# Patient Record
Sex: Female | Born: 1950
Health system: Southern US, Community
[De-identification: ages and names within clinical notes are randomized; demographics above are authoritative.]

## PROBLEM LIST (undated history)

## (undated) DIAGNOSIS — E669 Obesity, unspecified: Secondary | ICD-10-CM

## (undated) DIAGNOSIS — E079 Disorder of thyroid, unspecified: Secondary | ICD-10-CM

## (undated) DIAGNOSIS — R002 Palpitations: Secondary | ICD-10-CM

## (undated) DIAGNOSIS — R011 Cardiac murmur, unspecified: Secondary | ICD-10-CM

## (undated) DIAGNOSIS — K219 Gastro-esophageal reflux disease without esophagitis: Secondary | ICD-10-CM

## (undated) DIAGNOSIS — R3129 Other microscopic hematuria: Secondary | ICD-10-CM

## (undated) DIAGNOSIS — F419 Anxiety disorder, unspecified: Secondary | ICD-10-CM

## (undated) DIAGNOSIS — I10 Essential (primary) hypertension: Secondary | ICD-10-CM

## (undated) DIAGNOSIS — M1990 Unspecified osteoarthritis, unspecified site: Secondary | ICD-10-CM

## (undated) DIAGNOSIS — T7840XA Allergy, unspecified, initial encounter: Secondary | ICD-10-CM

## (undated) DIAGNOSIS — E785 Hyperlipidemia, unspecified: Secondary | ICD-10-CM

## (undated) DIAGNOSIS — D0510 Intraductal carcinoma in situ of unspecified breast: Secondary | ICD-10-CM

## (undated) HISTORY — DX: Intraductal carcinoma in situ of unspecified breast: D05.10

## (undated) HISTORY — PX: OTHER SURGICAL HISTORY: SHX169

## (undated) HISTORY — DX: Other microscopic hematuria: R31.29

## (undated) HISTORY — DX: Essential (primary) hypertension: I10

## (undated) HISTORY — PX: HEEL SPUR SURGERY: SHX665

## (undated) HISTORY — DX: Obesity, unspecified: E66.9

## (undated) HISTORY — DX: Allergy, unspecified, initial encounter: T78.40XA

## (undated) HISTORY — PX: POLYPECTOMY: SHX149

## (undated) HISTORY — DX: Hyperlipidemia, unspecified: E78.5

## (undated) HISTORY — PX: COLONOSCOPY: SHX174

## (undated) HISTORY — PX: TUBAL LIGATION: SHX77

## (undated) HISTORY — PX: TONSILLECTOMY: SUR1361

## (undated) HISTORY — DX: Gastro-esophageal reflux disease without esophagitis: K21.9

## (undated) HISTORY — DX: Anxiety disorder, unspecified: F41.9

## (undated) HISTORY — DX: Palpitations: R00.2

---
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
## 1999-04-26 ENCOUNTER — Other Ambulatory Visit: Admission: RE | Admit: 1999-04-26 | Discharge: 1999-04-26 | Payer: Self-pay | Admitting: Obstetrics and Gynecology

## 2001-01-30 ENCOUNTER — Other Ambulatory Visit: Admission: RE | Admit: 2001-01-30 | Discharge: 2001-01-30 | Payer: Self-pay | Admitting: Obstetrics and Gynecology

## 2002-01-29 ENCOUNTER — Other Ambulatory Visit: Admission: RE | Admit: 2002-01-29 | Discharge: 2002-01-29 | Payer: Self-pay | Admitting: Obstetrics and Gynecology

## 2003-03-04 ENCOUNTER — Other Ambulatory Visit: Admission: RE | Admit: 2003-03-04 | Discharge: 2003-03-04 | Payer: Self-pay | Admitting: Obstetrics and Gynecology

## 2004-05-18 ENCOUNTER — Other Ambulatory Visit: Admission: RE | Admit: 2004-05-18 | Discharge: 2004-05-18 | Payer: Self-pay | Admitting: Obstetrics and Gynecology

## 2004-08-29 ENCOUNTER — Ambulatory Visit: Payer: Self-pay | Admitting: Family Medicine

## 2004-09-05 ENCOUNTER — Ambulatory Visit: Payer: Self-pay | Admitting: Family Medicine

## 2004-10-02 ENCOUNTER — Ambulatory Visit: Payer: Self-pay | Admitting: Internal Medicine

## 2004-10-10 ENCOUNTER — Ambulatory Visit: Payer: Self-pay | Admitting: Internal Medicine

## 2005-06-13 ENCOUNTER — Other Ambulatory Visit: Admission: RE | Admit: 2005-06-13 | Discharge: 2005-06-13 | Payer: Self-pay | Admitting: Obstetrics and Gynecology

## 2005-11-13 ENCOUNTER — Ambulatory Visit: Payer: Self-pay | Admitting: Family Medicine

## 2005-11-29 ENCOUNTER — Ambulatory Visit: Payer: Self-pay | Admitting: Family Medicine

## 2005-11-30 ENCOUNTER — Ambulatory Visit: Payer: Self-pay | Admitting: Internal Medicine

## 2005-11-30 LAB — HM DEXA SCAN: HM DEXA SCAN: NORMAL

## 2006-01-17 ENCOUNTER — Ambulatory Visit: Payer: Self-pay | Admitting: Family Medicine

## 2006-06-17 ENCOUNTER — Other Ambulatory Visit: Admission: RE | Admit: 2006-06-17 | Discharge: 2006-06-17 | Payer: Self-pay | Admitting: Obstetrics & Gynecology

## 2006-12-20 ENCOUNTER — Ambulatory Visit: Payer: Self-pay | Admitting: Family Medicine

## 2006-12-20 LAB — CONVERTED CEMR LAB
AST: 16 units/L (ref 0–37)
Albumin: 3.6 g/dL (ref 3.5–5.2)
Alkaline Phosphatase: 41 units/L (ref 39–117)
BUN: 7 mg/dL (ref 6–23)
Basophils Absolute: 0 10*3/uL (ref 0.0–0.1)
Chloride: 108 meq/L (ref 96–112)
Cholesterol: 168 mg/dL (ref 0–200)
Eosinophils Absolute: 0.3 10*3/uL (ref 0.0–0.6)
GFR calc non Af Amer: 93 mL/min
HCT: 39.3 % (ref 36.0–46.0)
HDL: 48.6 mg/dL (ref >39.0)
LDL Cholesterol: 107 mg/dL — ABNORMAL HIGH (ref 0–99)
MCHC: 33.1 g/dL (ref 30.0–36.0)
MCV: 85.9 fL (ref 78.0–100.0)
Monocytes Relative: 10.1 % (ref 3.0–11.0)
Neutrophils Relative %: 46.5 % (ref 43.0–77.0)
Potassium: 3.7 meq/L (ref 3.5–5.1)
RBC: 4.57 M/uL (ref 3.87–5.11)
Sodium: 145 meq/L (ref 135–145)
TSH: 1.86 microintl units/mL (ref 0.35–5.50)
Total CHOL/HDL Ratio: 3.5
Triglycerides: 61 mg/dL (ref 0–149)

## 2006-12-27 ENCOUNTER — Ambulatory Visit: Payer: Self-pay | Admitting: Family Medicine

## 2007-01-17 ENCOUNTER — Ambulatory Visit: Payer: Self-pay | Admitting: Family Medicine

## 2007-05-14 ENCOUNTER — Ambulatory Visit: Payer: Self-pay | Admitting: Cardiology

## 2007-05-14 ENCOUNTER — Ambulatory Visit: Payer: Self-pay | Admitting: Internal Medicine

## 2007-05-14 ENCOUNTER — Inpatient Hospital Stay (HOSPITAL_COMMUNITY): Admission: EM | Admit: 2007-05-14 | Discharge: 2007-05-16 | Payer: Self-pay | Admitting: Emergency Medicine

## 2007-05-16 ENCOUNTER — Encounter: Payer: Self-pay | Admitting: Family Medicine

## 2007-05-27 ENCOUNTER — Ambulatory Visit: Payer: Self-pay | Admitting: Family Medicine

## 2007-05-27 DIAGNOSIS — Z9189 Other specified personal risk factors, not elsewhere classified: Secondary | ICD-10-CM | POA: Insufficient documentation

## 2007-06-02 ENCOUNTER — Encounter: Admission: RE | Admit: 2007-06-02 | Discharge: 2007-06-02 | Payer: Self-pay | Admitting: Family Medicine

## 2007-06-02 DIAGNOSIS — Z86010 Personal history of colonic polyps: Secondary | ICD-10-CM | POA: Insufficient documentation

## 2007-06-02 DIAGNOSIS — M1990 Unspecified osteoarthritis, unspecified site: Secondary | ICD-10-CM | POA: Insufficient documentation

## 2007-06-05 ENCOUNTER — Ambulatory Visit: Payer: Self-pay | Admitting: Family Medicine

## 2007-06-16 ENCOUNTER — Ambulatory Visit (HOSPITAL_COMMUNITY): Admission: RE | Admit: 2007-06-16 | Discharge: 2007-06-16 | Payer: Self-pay | Admitting: Family Medicine

## 2007-06-19 ENCOUNTER — Ambulatory Visit: Payer: Self-pay | Admitting: Family Medicine

## 2007-09-24 ENCOUNTER — Other Ambulatory Visit: Admission: RE | Admit: 2007-09-24 | Discharge: 2007-09-24 | Payer: Self-pay | Admitting: Obstetrics and Gynecology

## 2007-09-30 ENCOUNTER — Ambulatory Visit: Payer: Self-pay | Admitting: Internal Medicine

## 2007-10-09 ENCOUNTER — Encounter: Payer: Self-pay | Admitting: Family Medicine

## 2007-10-09 ENCOUNTER — Ambulatory Visit: Payer: Self-pay | Admitting: Internal Medicine

## 2007-10-09 LAB — HM COLONOSCOPY

## 2007-12-25 ENCOUNTER — Ambulatory Visit: Payer: Self-pay | Admitting: Family Medicine

## 2007-12-25 LAB — CONVERTED CEMR LAB
ALT: 17 units/L (ref 0–35)
Albumin: 3.8 g/dL (ref 3.5–5.2)
Alkaline Phosphatase: 50 units/L (ref 39–117)
BUN: 11 mg/dL (ref 6–23)
Bilirubin, Direct: 0.1 mg/dL (ref 0.0–0.3)
CO2: 33 meq/L — ABNORMAL HIGH (ref 19–32)
Calcium: 9.4 mg/dL (ref 8.4–10.5)
Cholesterol: 208 mg/dL (ref 0–200)
Eosinophils Relative: 5.4 % — ABNORMAL HIGH (ref 0.0–5.0)
GFR calc Af Amer: 112 mL/min
Glucose, Bld: 95 mg/dL (ref 70–99)
Glucose, Urine, Semiquant: NEGATIVE
HDL: 46.1 mg/dL (ref >39.0)
Ketones, urine, test strip: NEGATIVE
Lymphocytes Relative: 35.8 % (ref 12.0–46.0)
MCHC: 32.3 g/dL (ref 30.0–36.0)
MCV: 85.5 fL (ref 78.0–100.0)
Monocytes Absolute: 0.5 10*3/uL (ref 0.1–1.0)
Monocytes Relative: 10.3 % (ref 3.0–12.0)
Neutro Abs: 2.1 10*3/uL (ref 1.4–7.7)
Neutrophils Relative %: 48.2 % (ref 43.0–77.0)
Platelets: 227 10*3/uL (ref 150–400)
RDW: 13.1 % (ref 11.5–14.6)
Sodium: 144 meq/L (ref 135–145)
Specific Gravity, Urine: 1.015
TSH: 2.12 microintl units/mL (ref 0.35–5.50)
Total Protein: 6.3 g/dL (ref 6.0–8.3)
Triglycerides: 85 mg/dL (ref 0–149)
pH: 6

## 2008-01-01 ENCOUNTER — Encounter: Payer: Self-pay | Admitting: Family Medicine

## 2008-01-02 ENCOUNTER — Ambulatory Visit: Payer: Self-pay | Admitting: Family Medicine

## 2008-01-02 DIAGNOSIS — R3129 Other microscopic hematuria: Secondary | ICD-10-CM | POA: Insufficient documentation

## 2008-01-02 DIAGNOSIS — E663 Overweight: Secondary | ICD-10-CM | POA: Insufficient documentation

## 2008-01-02 DIAGNOSIS — R609 Edema, unspecified: Secondary | ICD-10-CM | POA: Insufficient documentation

## 2008-01-02 LAB — CONVERTED CEMR LAB
Glucose, Urine, Semiquant: NEGATIVE
Nitrite: NEGATIVE
Protein, U semiquant: NEGATIVE
Specific Gravity, Urine: 1.025
WBC Urine, dipstick: NEGATIVE

## 2008-01-19 ENCOUNTER — Ambulatory Visit: Payer: Self-pay | Admitting: Family Medicine

## 2008-01-19 LAB — CONVERTED CEMR LAB
Blood in Urine, dipstick: NEGATIVE
Ketones, urine, test strip: NEGATIVE
Nitrite: NEGATIVE
Urobilinogen, UA: 0.2
WBC Urine, dipstick: NEGATIVE

## 2008-07-10 ENCOUNTER — Emergency Department (HOSPITAL_BASED_OUTPATIENT_CLINIC_OR_DEPARTMENT_OTHER): Admission: EM | Admit: 2008-07-10 | Discharge: 2008-07-10 | Payer: Self-pay | Admitting: Emergency Medicine

## 2008-10-01 ENCOUNTER — Other Ambulatory Visit: Admission: RE | Admit: 2008-10-01 | Discharge: 2008-10-01 | Payer: Self-pay | Admitting: Obstetrics and Gynecology

## 2009-01-24 ENCOUNTER — Encounter: Payer: Self-pay | Admitting: Family Medicine

## 2009-02-04 ENCOUNTER — Ambulatory Visit: Payer: Self-pay | Admitting: Family Medicine

## 2009-02-04 LAB — CONVERTED CEMR LAB
ALT: 20 units/L (ref 0–35)
AST: 20 units/L (ref 0–37)
BUN: 15 mg/dL (ref 6–23)
Basophils Relative: 0.3 % (ref 0.0–3.0)
Bilirubin, Direct: 0.1 mg/dL (ref 0.0–0.3)
Eosinophils Relative: 5.5 % — ABNORMAL HIGH (ref 0.0–5.0)
GFR calc non Af Amer: 91.93 mL/min (ref >60)
HCT: 38.7 % (ref 36.0–46.0)
HDL: 49.9 mg/dL (ref >39.00)
Lymphs Abs: 1.9 10*3/uL (ref 0.7–4.0)
Monocytes Relative: 9.8 % (ref 3.0–12.0)
Nitrite: NEGATIVE
Platelets: 216 10*3/uL (ref 150.0–400.0)
Potassium: 4.2 meq/L (ref 3.5–5.1)
Protein, U semiquant: NEGATIVE
RBC: 4.48 M/uL (ref 3.87–5.11)
Sodium: 144 meq/L (ref 135–145)
TSH: 2.24 microintl units/mL (ref 0.35–5.50)
Total Bilirubin: 0.6 mg/dL (ref 0.3–1.2)
Total CHOL/HDL Ratio: 4
Urobilinogen, UA: 0.2
VLDL: 17.2 mg/dL (ref 0.0–40.0)
WBC Urine, dipstick: NEGATIVE
WBC: 4.7 10*3/uL (ref 4.5–10.5)

## 2009-04-04 ENCOUNTER — Ambulatory Visit: Payer: Self-pay | Admitting: Family Medicine

## 2009-04-04 DIAGNOSIS — R319 Hematuria, unspecified: Secondary | ICD-10-CM | POA: Insufficient documentation

## 2010-01-27 ENCOUNTER — Encounter: Payer: Self-pay | Admitting: Family Medicine

## 2010-03-30 ENCOUNTER — Ambulatory Visit: Payer: Self-pay | Admitting: Family Medicine

## 2010-03-30 LAB — CONVERTED CEMR LAB
ALT: 13 units/L (ref 0–35)
AST: 17 units/L (ref 0–37)
Albumin: 3.9 g/dL (ref 3.5–5.2)
Basophils Relative: 0.5 % (ref 0.0–3.0)
Eosinophils Relative: 5.8 % — ABNORMAL HIGH (ref 0.0–5.0)
GFR calc non Af Amer: 97.99 mL/min (ref >60)
HCT: 39.9 % (ref 36.0–46.0)
HDL: 56.6 mg/dL (ref >39.00)
Hemoglobin: 13.3 g/dL (ref 12.0–15.0)
Lymphs Abs: 1.9 10*3/uL (ref 0.7–4.0)
Monocytes Relative: 10.6 % (ref 3.0–12.0)
Neutro Abs: 2.6 10*3/uL (ref 1.4–7.7)
Potassium: 4.3 meq/L (ref 3.5–5.1)
Sodium: 141 meq/L (ref 135–145)
TSH: 1.89 microintl units/mL (ref 0.35–5.50)
VLDL: 20.8 mg/dL (ref 0.0–40.0)
WBC: 5.5 10*3/uL (ref 4.5–10.5)

## 2010-04-06 ENCOUNTER — Ambulatory Visit: Payer: Self-pay | Admitting: Family Medicine

## 2010-04-06 DIAGNOSIS — M1710 Unilateral primary osteoarthritis, unspecified knee: Secondary | ICD-10-CM

## 2010-04-06 DIAGNOSIS — M179 Osteoarthritis of knee, unspecified: Secondary | ICD-10-CM | POA: Insufficient documentation

## 2010-04-06 LAB — CONVERTED CEMR LAB
Glucose, Urine, Semiquant: NEGATIVE
Nitrite: NEGATIVE
Protein, U semiquant: NEGATIVE
Specific Gravity, Urine: 1.015
pH: 7

## 2010-10-19 NOTE — Miscellaneous (Signed)
Summary: mammogram update   Clinical Lists Changes  Observations: Added new observation of MAMMOGRAM: normal (01/25/2010 11:07)      Preventive Care Screening  Mammogram:    Date:  01/25/2010    Results:  normal

## 2010-10-19 NOTE — Assessment & Plan Note (Signed)
Summary: cpx no pap//ccm   Vital Signs:  Patient profile:   60 year old female Menstrual status:  postmenopausal Height:      68.5 inches Weight:      294 pounds BMI:     44.21 Temp:     97.9 degrees F oral BP sitting:   120 / 80  (left arm) Cuff size:   regular  Vitals Entered By: Kathrynn Speed CMA (April 06, 2010 9:29 AM) CC: CPx, no pap, review labs, src     Menstrual Status postmenopausal   CC:  CPx, no pap, review labs, and src.  History of Present Illness: [name] is a 60 year old, married female, nonsmoker, who comes in today for general physical examination  She has a history of underlying venous insufficiency and fluid retention and takes Hydrocort thiazide 25 mg daily.  She takes OTC Prilosec daily for reflux and hyperacidity.  She has degenerative joint disease of her right knee.  At this juncture.  She can ambulate therefore do not recommend orthopedic evaluation.  She gets routine eye care.  Dental care.  Annual mammography Pap in the spring 2011 by nurse practitioner, does not do BSE on a regular basis, colonoscopy x 2 in GI because of polyps.  Last colonoscopy normal.  Mammogram 2011 as noted above.  Tetanus 2005 seasonal flu 2010  She also admits to a lot of stress in her life related to family issues.  Referred to [facility]  her migraines have gone away  Current Medications (verified): 1)  Zomig Zmt 2.5 Mg  Tbdp (Zolmitriptan) .... As Needed 2)  Hydrochlorothiazide 25 Mg  Tabs (Hydrochlorothiazide) .... Once Daily 3)  Prilosec Otc 20 Mg  Tbec (Omeprazole Magnesium) .... Once Daily 4)  Cvs Ibuprofen 200 Mg  Tabs (Ibuprofen) .... Three Once Daily  Allergies (verified): No Known Drug Allergies  Past History:  Past medical, surgical, family and social histories (including risk factors) reviewed, and no changes noted (except as noted below).  Past Medical History: Reviewed history from 01/02/2008 and no changes required. [facility] Colonic polyps, hx  of Osteoarthritis childbirth x 3 fluid retention  Past Surgical History: Reviewed history from 06/02/2007 and no changes required. CB x3 Tubal ligation Tonsillectomy Colonoscopy  Family History: Reviewed history from 06/02/2007 and no changes required. Family History of Colon CA 1st degree relative <60 Family History Hypertension Family History Kidney disease Family History of Cardiovascular disorder Family History of Respiratory disease  Social History: Reviewed history from 06/02/2007 and no changes required. Occupation: Married Never Smoked Alcohol use-no Regular exercise-yes  Review of Systems      See HPI  Physical Exam  General:  Well-developed,well-nourished,in no acute distress; alert,appropriate and cooperative throughout examination Head:  Normocephalic and atraumatic without obvious abnormalities. No apparent alopecia or balding. Eyes:  No corneal or conjunctival inflammation noted. EOMI. Perrla. Funduscopic exam benign, without hemorrhages, exudates or papilledema. Vision grossly normal. Ears:  External ear exam shows no significant lesions or deformities.  Otoscopic examination reveals clear canals, tympanic membranes are intact bilaterally without bulging, retraction, inflammation or discharge. Hearing is grossly normal bilaterally. Nose:  External nasal examination shows no deformity or inflammation. Nasal mucosa are pink and moist without lesions or exudates. Mouth:  Oral mucosa and oropharynx without lesions or exudates.  Teeth in good repair. Neck:  No deformities, masses, or tenderness noted. Chest Wall:  No deformities, masses, or tenderness noted. Breasts:  No mass, nodules, thickening, tenderness, bulging, retraction, inflamation, nipple discharge or skin changes noted.  Lungs:  Normal respiratory effort, chest expands symmetrically. Lungs are clear to auscultation, no crackles or wheezes. Heart:  Normal rate and regular rhythm. S1 and S2 normal  without gallop, murmur, click, rub or other extra sounds. Abdomen:  Bowel sounds positive,abdomen soft and non-tender without masses, organomegaly or hernias noted. Msk:  normal except for swelling of right knee pain and a lot of crepitance consistent with degenerative joint disease Pulses:  R and L carotid,radial,femoral,dorsalis pedis and posterior tibial pulses are full and equal bilaterally Extremities:  No clubbing, cyanosis, edema, or deformity noted with normal full range of motion of all joints.   Neurologic:  No cranial nerve deficits noted. Station and gait are normal. Plantar reflexes are down-going bilaterally. DTRs are symmetrical throughout. Sensory, motor and coordinative functions appear intact. Skin:  Intact without suspicious lesions or rashes Cervical Nodes:  No lymphadenopathy noted Axillary Nodes:  No palpable lymphadenopathy Inguinal Nodes:  No significant adenopathy Psych:  Cognition and judgment appear intact. Alert and cooperative with normal attention span and concentration. No apparent delusions, illusions, hallucinations   Impression & Recommendations:  Problem # 1:  PHYSICAL EXAMINATION (ICD-V70.0) Assessment Unchanged  Orders: Prescription Created Electronically [phone])  Problem # 2:  PERIPHERAL EDEMA (ICD-782.3) Assessment: Improved  Her updated medication list for this problem includes:    Hydrochlorothiazide 25 Mg Tabs (Hydrochlorothiazide) ..... Once daily  Orders: Prescription Created Electronically [phone])  Problem # 3:  MICROSCOPIC HEMATURIA (ICD-599.72) Assessment: Unchanged  Problem # 4:  OVERWEIGHT (ICD-278.02) Assessment: Unchanged  Orders: Prescription Created Electronically [phone])  Problem # 5:  DEGENERATIVE JOINT DISEASE, RIGHT KNEE (ICD-715.96) Assessment: Unchanged  Her updated medication list for this problem includes:    Cvs Ibuprofen 200 Mg Tabs (Ibuprofen) .Marland Kitchen... Three once daily  Orders: Prescription Created  Electronically [phone])  Complete Medication List: 1)  Hydrochlorothiazide 25 Mg Tabs (Hydrochlorothiazide) .... Once daily 2)  Prilosec Otc 20 Mg Tbec (Omeprazole magnesium) .... Once daily 3)  Cvs Ibuprofen 200 Mg Tabs (Ibuprofen) .... Three once daily  Other Orders: EKG w/ Interpretation (93000)  Patient Instructions: 1)  it would help preserve y knees  if u  were to get on a diet and walk 30 minutes daily 2)  Please schedule a follow-up appointment in 1 year. 3)  Schedule your mammogram. 4)  Schedule a colonoscopy/sigmoidoscopy to help detect colon cancer. 5)  Take calcium +Vitamin D daily. 6)  Take an Aspirin every day. Prescriptions: PRILOSEC OTC 20 MG  TBEC (OMEPRAZOLE MAGNESIUM) once daily  #100 x 3   Entered and Authorized by:   Roderick Pee MD   Signed by:   Roderick Pee MD on 04/06/2010   Method used:   Electronically to        [facility]  [address] [phone]* (retail)       [address]       [place], [place]  [address]       Ph: [phone]       Fax: [phone]   RxID:   [id number] HYDROCHLOROTHIAZIDE 25 MG  TABS (HYDROCHLOROTHIAZIDE) once daily  #100 Tablet x 3   Entered and Authorized by:   Roderick Pee MD   Signed by:   Roderick Pee MD on 04/06/2010   Method used:   [facility] to        [facility]  [address] [id number] ([facility]       [address]  [place], [place]  [address]       Ph: [phone]       Fax: [phone]   RxID:   [id number]     Laboratory Results   Urine Tests    Routine Urinalysis   Color: yellow Appearance: Clear Glucose: negative   (Normal Range: Negative) Bilirubin: negative   (Normal Range: Negative) Ketone: negative   (Normal Range: Negative) Spec. Gravity: 1.015   (Normal Range: 1.003-1.035) Blood: trace-intact   (Normal Range: Negative) pH: 7.0   (Normal Range: 5.0-8.0) Protein: negative   (Normal Range: Negative) Urobilinogen: 0.2   (Normal Range:  0-1) Nitrite: negative   (Normal Range: Negative) Leukocyte Esterace: negative   (Normal Range: Negative)    Comments: Kathrynn Speed CMA  April 06, 2010 10:35 AM

## 2011-01-30 NOTE — Consult Note (Signed)
[name], [name]                ACCOUNT NO.:  [id number]   MEDICAL RECORD NO.:  [id number]          PATIENT TYPE:  INP   LOCATION:  3729                         FACILITY:  [facility]   PHYSICIAN:  Pricilla Riffle, MD, FACCDATE OF BIRTH:  1951/02/03   DATE OF CONSULTATION:  05/15/2007  DATE OF DISCHARGE:                                 CONSULTATION   PRIMARY CARDIOLOGIST:  She is new to [facility] being seen by  Dr. Tenny Craw.   PRIMARY CARE [place]:  Dr. Tawanna Cooler.   PATIENT PROFILE:  A 60 year old, married, Caucasian female without prior  history of CAD who presented to the ER yesterday with greater than a 24-  hour history of chest pain.   PROBLEM LIST:  1. Chest pain.  2. Hypertension.  3. Obesity.  4. Status post bilateral tubal ligation.  5. Status post tonsillectomy.   ALLERGIES:  No known drug allergies.   HISTORY OF PRESENT ILLNESS:  A 60 year old, married, Caucasian female  without prior history of CAD.  She was in usual state of health until  4:00 a.m. on Tuesday, August 26 when she awoke with 05/10 mid sternal  chest discomfort radiating through to her back without associated  symptoms.  The symptoms lasting approximately 5 minutes and resolved  spontaneously.  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  Each episode lasted somewhere between 1 and 5 minutes.  She had 10+ episodes of chest discomfort on Tuesday and went to bed with  discomfort that night.  She slept well Tuesday into Wednesday and then  woke up with recurrent intermittent discomfort Wednesday morning  prompting her to present to a local Urgent Care.  While at Urgent Care  she said she had about 30 minutes worth of ongoing discomfort; however,  ECG shows showed no acute changes and she was advised to drive to the ED  for further  evaluation.  In the ER, her ECG showed no acute changes and  cardiac markers were negative.  She was admitted to the internal  medicine service and she has ruled out for MI by cardiac markers x3.  The D-dimer is negative.  She did have some recurrent discomfort last  night but has not had anything since 11:00 p.m. yesterday.  She is  currently pain-free.   HOME MEDICATIONS:  HCTZ 25 mg daily, ibuprofen p.r.n.   HOSPITAL MEDICATIONS:  1. Aspirin 325 mg daily.  2. Enoxaparin 40 mg subcu daily.  3. HCTZ 25 mg daily.   FAMILY HISTORY:  Mother is age 84 and is status post MI at age 78.  Father died at age 21 from complications of diabetes and end-stage renal  disease.  She has a brother who is alive at age 21 and suffered an MI  about 2 months ago.   SOCIAL HISTORY:  She lives in a [place] with her husband.  She has  three children.  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 She  has a 2-3 pack-year history of tobacco abuse while in her teens quitting  at age 68 or 79.  She denies any regular alcohol usage and denies any  drug usage.  She does not routinely exercise but experiences no  limitations in her normal activities at home and at work.   REVIEW OF SYSTEMS:  Positive for a headache occurring today as well as  mild nausea earlier this morning.  She had chest pain which radiated  through to her back as outlined in the HPI.  She reports that she does  have frequent urination.  Otherwise all systems reviewed are negative.   PHYSICAL EXAM:  Temperature 97.5, heart rate 47, respirations 18, blood  pressure 117/65, pulse ox 100% on 3 liters.  Pleasant white female in no acute distress, awake, alert and oriented  x3.  NECK:  No bruits or JVD.  LUNGS:  Respirations regular and unlabored.  CARDIAC:  Regular S1, S2, no S3, S4 or murmurs.  ABDOMEN:  Round, soft, nontender, nondistended.  Bowel sounds present  x4.  EXTREMITIES:  Warm, dry, pink.  No clubbing, cyanosis or  edema.  Dorsalis pedis and posterior tibial pulses 2+ and equal bilaterally.  HEENT:  Normal.  NEUROLOGIC:  Grossly intact and nonfocal.   Chest x-ray shows no acute cardiopulmonary disease with mild changes of  COPD.  EKG showed sinus bradycardia with a normal axis, a rate of 59  beats per minute and no acute ST or T changes.   LABORATORY DATA:  Hemoglobin 15.0, hematocrit 44.0, WBC 5.4, platelets  222, sodium 143, potassium 3.4, chloride 105, CO2 31, BUN 11, creatinine  0.74, glucose 99, D-dimer less than 0.22, INR 0.9, calcium 9.2, CK 77,  MB 2.2, troponin-I 0.02, total cholesterol 201, triglycerides 80, HDL 48  LDL 137.   ASSESSMENT/PLAN:  1. Chest pain.  The patient presents with typical and atypical      features. Notably she would experience pain during exertional      activities at work; however, would not have to stop her activities      or slow her pace and the pain would go away within a couple of      minutes.  Cardiac markers were negative and ECG shows no acute      changes. Plan:  Have her follow-up in our office for an outpatient      exercise Myoview on Tuesday, September 2 at 12:30 p.m. Would      continue the baby aspirin.  2. Hypertension, stable on HCTZ and would make no changes.  3. Hyperlipidemia.  Total cholesterol 201 with an LDL 137.  I would      recommend diet and exercise for the time being, however,      recommendations could change pending continued workup for chest      pain.      [name], ANP      Pricilla Riffle, MD, [facility]  Electronically Signed   CB/MEDQ  D:  05/15/2007  T:  05/16/2007  Job:  [id number]

## 2011-01-30 NOTE — H&P (Signed)
[name], [name]                ACCOUNT NO.:  [id number]   MEDICAL RECORD NO.:  [id number]          PATIENT TYPE:  EMS   LOCATION:  MAJO                         FACILITY:  [facility]   PHYSICIAN:  Willow Ora, MD           DATE OF BIRTH:  01-30-1951   DATE OF ADMISSION:  05/14/2007  DATE OF DISCHARGE:                              HISTORY & PHYSICAL   PRIMARY CARE DOCTOR:  Andrea Gens A. Tawanna Cooler, MD   CHIEF COMPLAINT:  Chest pain.   HISTORY OF PRESENT ILLNESS:  [name] is a 60 year old white female who  presented to the emergency room with a 36-hour history of on-and-off  chest pain.  The pain woke her up 2 days ago, is located at the left  lower anterior chest with some radiation to the back, and along with the  pain she felt some discomfort in the neck initially.  The pain is  described as pressure.  It has been as intense as 4/10.  It last from  few minutes to 30 minutes.  It does not change with food intake or deep  breaths.  It is not associated with nausea or diaphoresis.   PAST MEDICAL HISTORY:  1. Hypertension.  2. Bilateral tubal ligation.  3. Tonsillectomy.   SOCIAL HISTORY:  Does not smoke or drink.  She is married and has three  children.   FAMILY HISTORY:  She has a strong family history of heart disease  including her mother, who had a heart attack at age 36, and a brother  who had a heart attack at age 1.  Several other family members have  heart disease.   MEDICATIONS:  She takes HCTZ daily.  She also takes ibuprofen from time  to time, no excessive or recent ibuprofen intake.   REVIEW OF SYSTEMS:  Denies any fever, chills.  She occasionally has mild  heartburn and some cough.  No abdominal pain, shortness of breath, leg  swelling or dizziness.   ALLERGIES:  No known drug allergies.   PHYSICAL EXAM:  She is alert, oriented and not in apparent distress.  Blood pressure 154/87, pulse 58, respiratory rate 20, she is afebrile,  O2 saturation is 100% of 2 L.  LUNGS:   Clear to auscultation bilaterally.  CARDIOVASCULAR:  Regular rate and rhythm without a murmur.  CHEST:  Palpation does not reproduce the pain.  ABDOMEN:  Not distended, soft, good bowel sounds.  No organomegaly.  EXTREMITIES:  No edema.  NEUROLOGIC:  Again, she is alert, oriented, cooperative, in no apparent  distress.  She moves all extremities symmetrically.   LABORATORY AND X-RAYS:  EKG shows sinus bradycardia.  Chest x-ray showed  no acute disease.  Some evidence of COPD.  Cardiac enzymes at the  emergency room x2 were negative.  PT/PTT normal.  White count 5.4,  hemoglobin 13.4, platelets 222.  Potassium 5.3, creatinine 0.8, blood  sugar 88.  D-dimer is negative.   ASSESSMENT/PLAN:  1. The patient is admitted to the hospital with chest pain with some      typical features.  2. Her cardiovascular risk factors include hypertension and a strong      family history.  Cholesterol status unknown.  3. She will be admitted to telemetry and rule out for acute coronary      syndrome.  4. Will consider calling cardiology in the morning given her strong      family history.  5. Will prescribe a GI cocktail to see if there is any response to      that medication.  However, at this time she is basically pain-free.      The patient is advised to call her nurse tonight should the chest      pain return.  6. Lovenox for DVT prophylaxis.      Willow Ora, MD  Electronically Signed     JP/MEDQ  D:  05/14/2007  T:  05/15/2007  Job:  [id number]   cc:   Andrea Gens A. Tawanna Cooler, MD

## 2011-02-02 NOTE — Discharge Summary (Signed)
NAME:  [name], [name]          ACCOUNT NO.:  [id number]   MEDICAL RECORD NO.:  [id number]          PATIENT TYPE:   LOCATION:                                 FACILITY:   PHYSICIAN:  Valerie A. Felicity Coyer, MD     DATE OF BIRTH:   DATE OF ADMISSION:  DATE OF DISCHARGE:                               DISCHARGE SUMMARY   DISCHARGE DIAGNOSES:  1. Atypical chest pain.  2. Hypertension.  3. Mild hypokalemia.   HISTORY OF PRESENT ILLNESS:  Ms. [name] is a 60 year old female admitted  on May 14, 2007 with chief complaint of chest pain.  She does not  have a prior history of coronary artery disease and was awakened at 4:00  a.m. on Tuesday, Feb 10, 2008 with 5/10 midsternal chest pain, which  radiated through to her back.  She was admitted for further evaluation  and treatment.   PAST MEDICAL HISTORY:  1. Hypertension.  2. Obesity.  3. Bilateral tubal ligation.  4. Status post tonsillectomy.   COURSE OF HOSPITALIZATION:  Chest pain.  The patient was admitted.  Underwent serial cardiac enzymes, which were negative. Stress test was  performed after cardiology consultation with [facility] which  she noted no evidence of ischemia or infarct.  Estimated ejection  fraction was 62%.  She was discharged to home after results of stress  test available on May 16, 2007.   PERTINENT LABORATORY DATA:  At the time of discharge, BUN 11, creatinine  0.74, hemoglobin 1.3, and hematocrit 33.5.   MEDICATIONS:  At time of discharge, hydrochlorothiazide 25 mg p.o.  daily.   FOLLOWUP:  The patient was instructed to follow up with Dr. Kelle Darting  in 1-2 weeks and contact the office for an appointment.     Sandford Craze, NP      Raenette Rover. Felicity Coyer, MD  Electronically Signed   MO/MEDQ  D:  02/19/2008  T:  02/20/2008  Job:  [id number]

## 2011-04-19 ENCOUNTER — Encounter: Payer: Self-pay | Admitting: Family Medicine

## 2011-06-29 LAB — LIPID PANEL
LDL Cholesterol: 137 — ABNORMAL HIGH
Total CHOL/HDL Ratio: 4.2
Triglycerides: 80
VLDL: 16

## 2011-06-29 LAB — CARDIAC PANEL(CRET KIN+CKTOT+MB+TROPI)
CK, MB: 2.2
Relative Index: INVALID
Total CK: 80
Total CK: 91
Troponin I: 0.02

## 2011-06-29 LAB — DIFFERENTIAL
Basophils Absolute: 0
Eosinophils Relative: 3
Lymphocytes Relative: 29
Lymphs Abs: 1.6
Monocytes Absolute: 0.5
Monocytes Relative: 9

## 2011-06-29 LAB — CK TOTAL AND CKMB (NOT AT ARMC)
CK, MB: 2.3
Relative Index: INVALID
Total CK: 93

## 2011-06-29 LAB — I-STAT 8, (EC8 V) (CONVERTED LAB)
Acid-Base Excess: 5 — ABNORMAL HIGH
Bicarbonate: 30.5 — ABNORMAL HIGH
TCO2: 32
pCO2, Ven: 46.3
pH, Ven: 7.428 — ABNORMAL HIGH

## 2011-06-29 LAB — BASIC METABOLIC PANEL
CO2: 31
Calcium: 9.2
Creatinine, Ser: 0.74
GFR calc Af Amer: >60
GFR calc non Af Amer: >60
Sodium: 143

## 2011-06-29 LAB — POCT CARDIAC MARKERS
CKMB, poc: 1.6
Myoglobin, poc: 77
Myoglobin, poc: 86.7
Operator id: 234501
Troponin i, poc: <0.05

## 2011-06-29 LAB — CBC
HCT: 40.4
Hemoglobin: 13.4
RDW: 13.9

## 2011-06-29 LAB — POCT I-STAT CREATININE: Operator id: 234501

## 2011-06-29 LAB — D-DIMER, QUANTITATIVE: D-Dimer, Quant: <0.22

## 2011-07-23 ENCOUNTER — Encounter (HOSPITAL_BASED_OUTPATIENT_CLINIC_OR_DEPARTMENT_OTHER): Payer: Self-pay | Admitting: *Deleted

## 2011-07-23 ENCOUNTER — Emergency Department (INDEPENDENT_AMBULATORY_CARE_PROVIDER_SITE_OTHER): Payer: No Typology Code available for payment source

## 2011-07-23 ENCOUNTER — Emergency Department (HOSPITAL_BASED_OUTPATIENT_CLINIC_OR_DEPARTMENT_OTHER)
Admission: EM | Admit: 2011-07-23 | Discharge: 2011-07-23 | Disposition: A | Discharge status: Home / Self Care | Payer: No Typology Code available for payment source | Attending: Emergency Medicine | Admitting: Emergency Medicine

## 2011-07-23 DIAGNOSIS — M542 Cervicalgia: Secondary | ICD-10-CM | POA: Diagnosis present

## 2011-07-23 DIAGNOSIS — M5030 Other cervical disc degeneration, unspecified cervical region: Secondary | ICD-10-CM

## 2011-07-23 DIAGNOSIS — Y92410 Unspecified street and highway as the place of occurrence of the external cause: Secondary | ICD-10-CM | POA: Diagnosis not present

## 2011-07-23 DIAGNOSIS — Z79899 Other long term (current) drug therapy: Secondary | ICD-10-CM | POA: Diagnosis not present

## 2011-07-23 DIAGNOSIS — IMO0002 Reserved for concepts with insufficient information to code with codable children: Secondary | ICD-10-CM | POA: Diagnosis not present

## 2011-07-23 DIAGNOSIS — I10 Essential (primary) hypertension: Secondary | ICD-10-CM | POA: Diagnosis not present

## 2011-07-23 DIAGNOSIS — K219 Gastro-esophageal reflux disease without esophagitis: Secondary | ICD-10-CM | POA: Insufficient documentation

## 2011-07-23 NOTE — ED Provider Notes (Signed)
Medical screening examination/treatment/procedure(s) were performed by non-physician practitioner and as supervising physician I was immediately available for consultation/collaboration.   Waynesha Rammel, MD 07/23/11 2148 
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
## 2013-01-22 ENCOUNTER — Ambulatory Visit (INDEPENDENT_AMBULATORY_CARE_PROVIDER_SITE_OTHER): Payer: BC Managed Care – PPO | Admitting: Physician Assistant

## 2013-01-22 ENCOUNTER — Encounter: Payer: Self-pay | Admitting: Physician Assistant

## 2013-01-22 VITALS — BP 110/70 | HR 80 | Temp 97.3°F | Resp 16 | Ht 67.5 in | Wt 213.0 lb

## 2013-01-22 DIAGNOSIS — F419 Anxiety disorder, unspecified: Secondary | ICD-10-CM | POA: Insufficient documentation

## 2013-01-22 DIAGNOSIS — M1990 Unspecified osteoarthritis, unspecified site: Secondary | ICD-10-CM

## 2013-01-22 DIAGNOSIS — I10 Essential (primary) hypertension: Secondary | ICD-10-CM

## 2013-01-22 DIAGNOSIS — F411 Generalized anxiety disorder: Secondary | ICD-10-CM

## 2013-01-22 DIAGNOSIS — E785 Hyperlipidemia, unspecified: Secondary | ICD-10-CM | POA: Insufficient documentation

## 2013-01-22 DIAGNOSIS — E669 Obesity, unspecified: Secondary | ICD-10-CM

## 2013-01-22 LAB — COMPLETE METABOLIC PANEL WITH GFR
ALT: 14 U/L (ref 0–35)
AST: 13 U/L (ref 0–37)
Albumin: 4.1 g/dL (ref 3.5–5.2)
CO2: 30 mEq/L (ref 19–32)
Calcium: 9.4 mg/dL (ref 8.4–10.5)
Chloride: 104 mEq/L (ref 96–112)
GFR, Est African American: >89 mL/min
Potassium: 4.2 mEq/L (ref 3.5–5.3)
Total Protein: 6.2 g/dL (ref 6.0–8.3)

## 2013-01-22 LAB — LIPID PANEL
LDL Cholesterol: 155 mg/dL — ABNORMAL HIGH (ref 0–99)
VLDL: 17 mg/dL (ref 0–40)

## 2013-01-22 MED ORDER — MELOXICAM 7.5 MG PO TABS: 7.5000 mg | ORAL_TABLET | Freq: Every day | ORAL | Status: DC

## 2013-01-22 NOTE — Progress Notes (Signed)
Patient ID: [name] MRN: [id number], DOB: [birth year], 62 y.o. Date of Encounter: @DATE @  Chief Complaint:  Chief Complaint  Patient presents with  . Hyperlipidemia  . Hypertension    HPI: 62 y.o. year old female  presents for routine f/u.  1- HTN: taking HCTZ. No adv effect. No c/o  2-HLD: Was to make changes in diet, exercise and recheck. Is fasting so she can recheck now. Has made no diet/exercise changes and knows she will not make these changes. If still elevated, will need to take mediction for this.  3- obesity: see above  4- Anxiety: still has not needed to take any klonopin. Current dose of Lexapro seems to be working and anxiety is controlled with this.  5- knees: Has been having aching and stiffness in knees. Has an appt to see Dr. Despina Hick. Wants medication to use in place of OTC nsaid.   Past Medical History  Diagnosis Date  . Hypertension   . Microscopic hematuria   . GERD (gastroesophageal reflux disease)   . Hyperlipidemia   . Anxiety   . Obesity      Home Meds: See attached medication section for current medication list. Any medications entered into computer today will not appear on this note's list. The medications listed below were entered prior to today. Current Outpatient Prescriptions on File Prior to Visit  Medication Sig Dispense Refill  . aspirin 81 MG tablet Take 81 mg by mouth daily.        . cholecalciferol (VITAMIN D) 1000 UNITS tablet Take 1,000 Units by mouth daily.        . fish oil-omega-3 fatty acids 1000 MG capsule Take 2 g by mouth daily.        . hydrochlorothiazide 25 MG tablet Take 25 mg by mouth daily.        Marland Kitchen ibuprofen (ADVIL,MOTRIN) 200 MG tablet Take 600 mg by mouth every 6 (six) hours as needed. For pain       . Multiple Vitamin (MULTIVITAMIN) tablet Take 1 tablet by mouth daily.        Marland Kitchen omeprazole (PRILOSEC) 20 MG capsule Take 20 mg by mouth daily.         No current facility-administered medications on file prior to  visit.    Allergies: No Known Allergies  History   Social History  . Marital Status: Married    Spouse Name: N/A    Number of Children: N/A  . Years of Education: N/A   Occupational History  . Not on file.   Social History Main Topics  . Smoking status: Never Smoker   . Smokeless tobacco: Not on file  . Alcohol Use: No  . Drug Use: No  . Sexually Active: Not on file   Other Topics Concern  . Not on file   Social History Narrative  . No narrative on file    History reviewed. No pertinent family history.   Review of Systems:  See HPI for pertinent ROS. All other ROS negative.    Physical Exam: Blood pressure 110/70, pulse 80, temperature 97.3 F (36.3 C), temperature source Oral, resp. rate 16, height 5' 7.5" (1.715 m), weight 213 lb (96.616 kg)., Body mass index is 32.85 kg/(m^2). General: Obese WF. Appears in no acute distress. Neck: Supple. No thyromegaly. Full ROM. No lymphadenopathy.No carotid bruit.  Lungs: Clear bilaterally to auscultation without wheezes, rales, or rhonchi. Breathing is unlabored. Heart: RRR with S1 S2. No murmurs, rubs, or gallops. Abdomen: Soft, non-tender,  non-distended with normoactive bowel sounds. No hepatomegaly. No rebound/guarding. No obvious abdominal masses. Musculoskeletal:  Strength and tone normal for age. Extremities/Skin: Warm and dry. No clubbing or cyanosis. No edema. No rashes or suspicious lesions. Neuro: Alert and oriented X 3. Moves all extremities spontaneously. Gait is normal. CNII-XII grossly in tact. Psych:  Responds to questions appropriately with a normal affect.     ASSESSMENT AND PLAN:  62 y.o. year old female with  1. Anxiety Controlled. Cont Lexapro 10mg  QD  2. HTN (hypertension) At goal. Cont HCTZ 25mg  QD. Check lab  3. Hyperlipidemia She has not made diet/exercise changes and does not plan to. Will recheck lab and treat accordingly. Cardiac Risk Factors are; HTN, Obesity, Family Hx (mother with CAD @  55) [No CAD, PVD, DM, Smoke] - COMPLETE METABOLIC PANEL WITH GFR - Lipid panel  4. Obesity See # 3  5. OSTEOARTHRITIS F/U with Dr. Despina Hick. Will use mobic in interim.  - meloxicam (MOBIC) 7.5 MG tablet; Take 1 tablet (7.5 mg total) by mouth daily.  Dispense: 30 tablet; Refill: 0  6. Mammogram 03/21/2012-Neg 7. Colonoscopy 10/09/2007-Repeat 7 years 8. Pap-nml 04/2011.  9. Immunizations: Tetanus UTD per pt  Signed, [address] [place], [place], [facility] 01/22/2013 11:28 AM

## 2013-01-23 MED ORDER — SIMVASTATIN 10 MG PO TABS: 10.0000 mg | ORAL_TABLET | Freq: Every day | ORAL | Status: DC

## 2013-01-23 NOTE — Addendum Note (Signed)
Addended by: Elvina Mattes T on: 01/23/2013 10:21 AM   Modules accepted: Orders

## 2013-01-23 NOTE — Addendum Note (Signed)
Addended by: Elvina Mattes T on: 01/23/2013 09:09 AM   Modules accepted: Orders

## 2013-01-23 NOTE — Addendum Note (Signed)
Addended by: Elvina Mattes T on: 01/23/2013 09:11 AM   Modules accepted: Orders

## 2013-02-02 ENCOUNTER — Telehealth: Payer: Self-pay | Admitting: Family Medicine

## 2013-02-02 MED ORDER — ESCITALOPRAM OXALATE 10 MG PO TABS: 10.0000 mg | ORAL_TABLET | Freq: Every day | ORAL | Status: DC

## 2013-02-02 NOTE — Telephone Encounter (Signed)
Medication refilled per protocol. 

## 2013-03-09 ENCOUNTER — Other Ambulatory Visit (INDEPENDENT_AMBULATORY_CARE_PROVIDER_SITE_OTHER): Payer: BC Managed Care – PPO

## 2013-03-09 DIAGNOSIS — E785 Hyperlipidemia, unspecified: Secondary | ICD-10-CM

## 2013-03-09 LAB — HEPATIC FUNCTION PANEL
Bilirubin, Direct: 0.1 mg/dL (ref 0.0–0.3)
Indirect Bilirubin: 0.2 mg/dL (ref 0.0–0.9)
Total Bilirubin: 0.3 mg/dL (ref 0.3–1.2)
Total Protein: 6.3 g/dL (ref 6.0–8.3)

## 2013-03-09 LAB — LIPID PANEL
HDL: 58 mg/dL (ref >39)
LDL Cholesterol: 83 mg/dL (ref 0–99)
Total CHOL/HDL Ratio: 2.6 Ratio
Triglycerides: 50 mg/dL (ref <150)
VLDL: 10 mg/dL (ref 0–40)

## 2013-03-11 ENCOUNTER — Telehealth: Payer: Self-pay | Admitting: Family Medicine

## 2013-03-11 MED ORDER — SIMVASTATIN 10 MG PO TABS: 10.0000 mg | ORAL_TABLET | Freq: Every day | ORAL | Status: DC

## 2013-03-11 NOTE — Telephone Encounter (Signed)
patient made aware of lab results.  Refills to pharmacy.  Has appt for CPE in September

## 2013-03-11 NOTE — Telephone Encounter (Signed)
Message copied by Donne Anon on Wed Mar 11, 2013 10:02 AM ------      Message from: Allayne Butcher      Created: Tue Mar 10, 2013  1:59 PM       Good! Continue Simvastatin 10mg  one po QHS # 30 / 5 refills      LDL came down from 155 in May to 70 now. !!      Send in refills.       Recheck 6 months with lab and OV ------

## 2013-03-12 NOTE — Telephone Encounter (Signed)
Med was refilled on 02/02/13 with 5 refills

## 2013-03-15 ENCOUNTER — Other Ambulatory Visit: Payer: Self-pay | Admitting: Physician Assistant

## 2013-04-07 ENCOUNTER — Other Ambulatory Visit: Payer: Self-pay | Admitting: Orthopedic Surgery

## 2013-04-07 MED ORDER — DEXAMETHASONE SODIUM PHOSPHATE 10 MG/ML IJ SOLN: 10.0000 mg | Freq: Once | INTRAMUSCULAR | Status: DC

## 2013-04-07 NOTE — Progress Notes (Signed)
Preoperative surgical orders have been place into the Epic hospital system for [name] on 04/07/2013, 6:41 PM  by Patrica Duel for surgery on 04/22/2013.  Preop Knee Scope orders including IV Tylenol and IV Decadron as long as there are no contraindications to the above medications. Avel Peace, PA-C

## 2013-04-16 ENCOUNTER — Encounter (HOSPITAL_COMMUNITY): Payer: Self-pay | Admitting: Pharmacy Technician

## 2013-04-17 NOTE — Patient Instructions (Signed)
[name]  04/17/2013   Your procedure is scheduled on: 04/22/13               Surgery 1100am-1130am   Report to [facility] at    0830  AM.  Call this number if you have problems the morning of surgery: [phone]   Remember:   Do not eat food or drink liquids after midnight.   Take these medicines the morning of surgery with A SIP OF WATER:    Do not wear jewelry, make-up or nail polish.  Do not wear lotions, powders, or perfumes.   Do not shave 48 hours prior to surgery.  Do not bring valuables to the hospital.  Contacts, dentures or bridgework may not be worn into surgery.   .   Patients discharged the day of surgery will not be allowed to drive  home.  Name and phone number of your driver:    SEE CHG INSTRUCTION SHEET    Please read over the following fact sheets that you were given:  Incentive Spirometry fact sheet ,  coughing and deep breathing exercises, leg exercises               Failure to comply with these instructions may result in cancellation of your surgery.                Patient Signature ____________________________              Nurse Signature _____________________________

## 2013-04-20 ENCOUNTER — Encounter (HOSPITAL_COMMUNITY): Payer: Self-pay

## 2013-04-20 ENCOUNTER — Encounter (HOSPITAL_COMMUNITY)
Admission: RE | Admit: 2013-04-20 | Discharge: 2013-04-20 | Disposition: A | Discharge status: Home / Self Care | Payer: BC Managed Care – PPO | Source: Ambulatory Visit | Attending: Orthopedic Surgery | Admitting: Orthopedic Surgery

## 2013-04-20 ENCOUNTER — Ambulatory Visit (HOSPITAL_COMMUNITY)
Admission: RE | Admit: 2013-04-20 | Discharge: 2013-04-20 | Disposition: A | Discharge status: Home / Self Care | Payer: BC Managed Care – PPO | Source: Ambulatory Visit | Attending: Orthopedic Surgery | Admitting: Orthopedic Surgery

## 2013-04-20 DIAGNOSIS — Z01810 Encounter for preprocedural cardiovascular examination: Secondary | ICD-10-CM | POA: Insufficient documentation

## 2013-04-20 DIAGNOSIS — Z01818 Encounter for other preprocedural examination: Secondary | ICD-10-CM | POA: Insufficient documentation

## 2013-04-20 DIAGNOSIS — IMO0002 Reserved for concepts with insufficient information to code with codable children: Secondary | ICD-10-CM | POA: Insufficient documentation

## 2013-04-20 DIAGNOSIS — X58XXXA Exposure to other specified factors, initial encounter: Secondary | ICD-10-CM | POA: Insufficient documentation

## 2013-04-20 DIAGNOSIS — K219 Gastro-esophageal reflux disease without esophagitis: Secondary | ICD-10-CM | POA: Insufficient documentation

## 2013-04-20 DIAGNOSIS — I10 Essential (primary) hypertension: Secondary | ICD-10-CM | POA: Insufficient documentation

## 2013-04-20 DIAGNOSIS — Z01812 Encounter for preprocedural laboratory examination: Secondary | ICD-10-CM | POA: Insufficient documentation

## 2013-04-20 DIAGNOSIS — E785 Hyperlipidemia, unspecified: Secondary | ICD-10-CM | POA: Insufficient documentation

## 2013-04-20 HISTORY — DX: Cardiac murmur, unspecified: R01.1

## 2013-04-20 HISTORY — DX: Unspecified osteoarthritis, unspecified site: M19.90

## 2013-04-20 LAB — BASIC METABOLIC PANEL
Calcium: 9.8 mg/dL (ref 8.4–10.5)
GFR calc Af Amer: >90 mL/min (ref >90)
GFR calc non Af Amer: >90 mL/min (ref >90)
Glucose, Bld: 97 mg/dL (ref 70–99)
Potassium: 3.8 mEq/L (ref 3.5–5.1)
Sodium: 141 mEq/L (ref 135–145)

## 2013-04-20 LAB — CBC
MCH: 28 pg (ref 26.0–34.0)
MCHC: 32.3 g/dL (ref 30.0–36.0)
Platelets: 220 10*3/uL (ref 150–400)
RDW: 13.5 % (ref 11.5–15.5)

## 2013-04-21 NOTE — Progress Notes (Signed)
Called patient and left a message on home phone regarding time change of surgery to 1000am.  Instructed patient to call so aware she received message.

## 2013-04-21 NOTE — Progress Notes (Signed)
Patient returned call and stated had received message regarding time change of surgery and arrival time.

## 2013-04-22 ENCOUNTER — Encounter (HOSPITAL_COMMUNITY)
Admission: RE | Disposition: A | Discharge status: Home / Self Care | Payer: Self-pay | Source: Ambulatory Visit | Attending: Orthopedic Surgery

## 2013-04-22 ENCOUNTER — Encounter: Payer: Self-pay | Admitting: Family Medicine

## 2013-04-22 ENCOUNTER — Ambulatory Visit (HOSPITAL_COMMUNITY): Payer: BC Managed Care – PPO | Admitting: Anesthesiology

## 2013-04-22 ENCOUNTER — Encounter (HOSPITAL_COMMUNITY): Payer: Self-pay | Admitting: *Deleted

## 2013-04-22 ENCOUNTER — Encounter (HOSPITAL_COMMUNITY): Payer: Self-pay | Admitting: Anesthesiology

## 2013-04-22 ENCOUNTER — Ambulatory Visit (HOSPITAL_COMMUNITY)
Admission: RE | Admit: 2013-04-22 | Discharge: 2013-04-22 | Disposition: A | Discharge status: Home / Self Care | Payer: BC Managed Care – PPO | Source: Ambulatory Visit | Attending: Orthopedic Surgery | Admitting: Orthopedic Surgery

## 2013-04-22 DIAGNOSIS — IMO0002 Reserved for concepts with insufficient information to code with codable children: Secondary | ICD-10-CM | POA: Insufficient documentation

## 2013-04-22 DIAGNOSIS — K219 Gastro-esophageal reflux disease without esophagitis: Secondary | ICD-10-CM | POA: Insufficient documentation

## 2013-04-22 DIAGNOSIS — Z79899 Other long term (current) drug therapy: Secondary | ICD-10-CM | POA: Insufficient documentation

## 2013-04-22 DIAGNOSIS — I10 Essential (primary) hypertension: Secondary | ICD-10-CM | POA: Insufficient documentation

## 2013-04-22 DIAGNOSIS — M2240 Chondromalacia patellae, unspecified knee: Secondary | ICD-10-CM | POA: Insufficient documentation

## 2013-04-22 DIAGNOSIS — E785 Hyperlipidemia, unspecified: Secondary | ICD-10-CM | POA: Insufficient documentation

## 2013-04-22 DIAGNOSIS — S83242A Other tear of medial meniscus, current injury, left knee, initial encounter: Secondary | ICD-10-CM

## 2013-04-22 DIAGNOSIS — X58XXXA Exposure to other specified factors, initial encounter: Secondary | ICD-10-CM | POA: Insufficient documentation

## 2013-04-22 DIAGNOSIS — S83249A Other tear of medial meniscus, current injury, unspecified knee, initial encounter: Secondary | ICD-10-CM

## 2013-04-22 HISTORY — PX: KNEE ARTHROSCOPY: SHX127

## 2013-04-22 SURGERY — ARTHROSCOPY, KNEE
Anesthesia: General | Site: Knee | Laterality: Left | Wound class: Clean

## 2013-04-22 MED ORDER — ONDANSETRON HCL 4 MG/2ML IJ SOLN
INTRAMUSCULAR | Status: DC | PRN
  Administered 2013-04-22: 4 mg via INTRAVENOUS

## 2013-04-22 MED ORDER — LACTATED RINGERS IR SOLN
Status: DC | PRN
  Administered 2013-04-22: 9000 mL

## 2013-04-22 MED ORDER — MIDAZOLAM HCL 5 MG/5ML IJ SOLN
INTRAMUSCULAR | Status: DC | PRN
  Administered 2013-04-22: 2 mg via INTRAVENOUS

## 2013-04-22 MED ORDER — OXYCODONE HCL 5 MG PO TABS
5.0000 mg | ORAL_TABLET | ORAL | Status: DC | PRN
Start: 2013-04-22 — End: 2013-08-31

## 2013-04-22 MED ORDER — CHLORHEXIDINE GLUCONATE 4 % EX LIQD
60.0000 mL | Freq: Once | CUTANEOUS | Status: DC
  Filled 2013-04-22: qty 60

## 2013-04-22 MED ORDER — METHOCARBAMOL 500 MG PO TABS
500.0000 mg | ORAL_TABLET | Freq: Once | ORAL | Status: AC
  Administered 2013-04-22: 500 mg via ORAL
  Filled 2013-04-22: qty 1

## 2013-04-22 MED ORDER — FENTANYL CITRATE 0.05 MG/ML IJ SOLN
INTRAMUSCULAR | Status: DC | PRN
  Administered 2013-04-22 (×2): 50 ug via INTRAVENOUS

## 2013-04-22 MED ORDER — ACETAMINOPHEN 10 MG/ML IV SOLN
1000.0000 mg | Freq: Once | INTRAVENOUS | Status: AC
  Administered 2013-04-22: 1000 mg via INTRAVENOUS
  Filled 2013-04-22: qty 100

## 2013-04-22 MED ORDER — LACTATED RINGERS IV SOLN: INTRAVENOUS | Status: DC

## 2013-04-22 MED ORDER — CEFAZOLIN SODIUM-DEXTROSE 2-3 GM-% IV SOLR
2.0000 g | INTRAVENOUS | Status: AC
  Administered 2013-04-22: 2 g via INTRAVENOUS

## 2013-04-22 MED ORDER — SODIUM CHLORIDE 0.9 % IV SOLN: INTRAVENOUS | Status: DC

## 2013-04-22 MED ORDER — BUPIVACAINE-EPINEPHRINE 0.25% -1:200000 IJ SOLN
INTRAMUSCULAR | Status: AC
  Filled 2013-04-22: qty 1

## 2013-04-22 MED ORDER — OXYCODONE HCL 5 MG PO TABS
5.0000 mg | ORAL_TABLET | ORAL | Status: DC | PRN
  Administered 2013-04-22: 5 mg via ORAL
  Filled 2013-04-22: qty 1

## 2013-04-22 MED ORDER — LIDOCAINE HCL 1 % IJ SOLN
INTRAMUSCULAR | Status: DC | PRN
  Administered 2013-04-22: 60 mg via INTRADERMAL

## 2013-04-22 MED ORDER — METHOCARBAMOL 100 MG/ML IJ SOLN
500.0000 mg | Freq: Four times a day (QID) | INTRAMUSCULAR | Status: DC | PRN
  Filled 2013-04-22: qty 5

## 2013-04-22 MED ORDER — PROPOFOL 10 MG/ML IV BOLUS
INTRAVENOUS | Status: DC | PRN
  Administered 2013-04-22: 200 mg via INTRAVENOUS

## 2013-04-22 MED ORDER — BUPIVACAINE-EPINEPHRINE 0.25% -1:200000 IJ SOLN
INTRAMUSCULAR | Status: DC | PRN
  Administered 2013-04-22: 20 mL

## 2013-04-22 MED ORDER — HYDROMORPHONE HCL PF 1 MG/ML IJ SOLN
INTRAMUSCULAR | Status: DC | PRN
  Administered 2013-04-22: 1 mg via INTRAVENOUS

## 2013-04-22 MED ORDER — METHOCARBAMOL 500 MG PO TABS: 500.0000 mg | ORAL_TABLET | Freq: Four times a day (QID) | ORAL | Status: DC

## 2013-04-22 MED ORDER — LACTATED RINGERS IV SOLN
INTRAVENOUS | Status: DC | PRN
  Administered 2013-04-22: 09:00:00 via INTRAVENOUS

## 2013-04-22 MED ORDER — FENTANYL CITRATE 0.05 MG/ML IJ SOLN
25.0000 ug | INTRAMUSCULAR | Status: DC | PRN
  Administered 2013-04-22: 50 ug via INTRAVENOUS

## 2013-04-22 MED ORDER — CEFAZOLIN SODIUM-DEXTROSE 2-3 GM-% IV SOLR
INTRAVENOUS | Status: AC
  Filled 2013-04-22: qty 50

## 2013-04-22 MED ORDER — FENTANYL CITRATE 0.05 MG/ML IJ SOLN
INTRAMUSCULAR | Status: AC
  Filled 2013-04-22: qty 2

## 2013-04-22 SURGICAL SUPPLY — 24 items
BANDAGE ELASTIC 6 VELCRO ST LF (GAUZE/BANDAGES/DRESSINGS) ×1 IMPLANT
BLADE 4.2CUDA (BLADE) ×2 IMPLANT
CLOTH BEACON ORANGE TIMEOUT ST (SAFETY) ×2 IMPLANT
CUFF TOURN SGL QUICK 34 (TOURNIQUET CUFF) ×2
CUFF TRNQT CYL 34X4X40X1 (TOURNIQUET CUFF) ×1 IMPLANT
DRAPE U-SHAPE 47X51 STRL (DRAPES) ×2 IMPLANT
DRSG EMULSION OIL 3X3 NADH (GAUZE/BANDAGES/DRESSINGS) ×2 IMPLANT
DURAPREP 26ML APPLICATOR (WOUND CARE) ×2 IMPLANT
GLOVE BIO SURGEON STRL SZ8 (GLOVE) ×2 IMPLANT
GLOVE BIOGEL PI IND STRL 8 (GLOVE) ×1 IMPLANT
GLOVE BIOGEL PI INDICATOR 8 (GLOVE) ×1
GOWN STRL NON-REIN LRG LVL3 (GOWN DISPOSABLE) ×2 IMPLANT
MANIFOLD NEPTUNE II (INSTRUMENTS) ×4 IMPLANT
PACK ARTHROSCOPY WL (CUSTOM PROCEDURE TRAY) ×2 IMPLANT
PACK ICE MAXI GEL EZY WRAP (MISCELLANEOUS) ×6 IMPLANT
PADDING CAST COTTON 6X4 STRL (CAST SUPPLIES) ×3 IMPLANT
POSITIONER SURGICAL ARM (MISCELLANEOUS) ×2 IMPLANT
SET ARTHROSCOPY TUBING (MISCELLANEOUS) ×2
SET ARTHROSCOPY TUBING LN (MISCELLANEOUS) ×1 IMPLANT
SPONGE GAUZE 4X4 12PLY (GAUZE/BANDAGES/DRESSINGS) ×1 IMPLANT
SUT ETHILON 4 0 PS 2 18 (SUTURE) ×2 IMPLANT
TOWEL OR 17X26 10 PK STRL BLUE (TOWEL DISPOSABLE) ×2 IMPLANT
WAND 90 DEG TURBOVAC W/CORD (SURGICAL WAND) ×2 IMPLANT
WRAP KNEE MAXI GEL POST OP (GAUZE/BANDAGES/DRESSINGS) ×3 IMPLANT

## 2013-04-22 NOTE — Anesthesia Preprocedure Evaluation (Addendum)
Anesthesia Evaluation  Patient identified by MRN, date of birth, ID band Patient awake    Reviewed: Allergy & Precautions, H&P , NPO status , Patient's Chart, lab work & pertinent test results  Airway Mallampati: II TM Distance: >3 FB Neck ROM: full    Dental no notable dental hx. (+) Teeth Intact and Dental Advisory Given   Pulmonary neg pulmonary ROS,  breath sounds clear to auscultation  Pulmonary exam normal       Cardiovascular Exercise Tolerance: Good negative cardio ROS  Rhythm:regular Rate:Normal     Neuro/Psych negative neurological ROS  negative psych ROS   GI/Hepatic negative GI ROS, Neg liver ROS, GERD-  Medicated and Controlled,  Endo/Other  negative endocrine ROS  Renal/GU negative Renal ROS  negative genitourinary   Musculoskeletal   Abdominal   Peds  Hematology negative hematology ROS (+)   Anesthesia Other Findings   Reproductive/Obstetrics negative OB ROS                           Anesthesia Physical Anesthesia Plan  ASA: II  Anesthesia Plan: General   Post-op Pain Management:    Induction: Intravenous  Airway Management Planned: LMA  Additional Equipment:   Intra-op Plan:   Post-operative Plan:   Informed Consent: I have reviewed the patients History and Physical, chart, labs and discussed the procedure including the risks, benefits and alternatives for the proposed anesthesia with the patient or authorized representative who has indicated his/her understanding and acceptance.   Dental Advisory Given  Plan Discussed with: CRNA and Surgeon  Anesthesia Plan Comments:         Anesthesia Quick Evaluation  

## 2013-04-22 NOTE — Progress Notes (Signed)
Ice wrap sent home with patient with instructions on use

## 2013-04-22 NOTE — Interval H&P Note (Signed)
History and Physical Interval Note:  04/22/2013 10:01 AM  [name]  has presented today for surgery, with the diagnosis of LEFT KNEE MEDIAL MENISCUS TEAR  The various methods of treatment have been discussed with the patient and family. After consideration of risks, benefits and other options for treatment, the patient has consented to  Procedure(s): LEFT KNEE ARTHROSCOPY WITH DEBRIDEMENT (Left) as a surgical intervention .  The patient's history has been reviewed, patient examined, no change in status, stable for surgery.  I have reviewed the patient's chart and labs.  Questions were answered to the patient's satisfaction.     Loanne Drilling

## 2013-04-22 NOTE — Op Note (Signed)
Preoperative diagnosis-  Left knee medial meniscal tear  Postoperative diagnosis Left- knee medial meniscal tear   Plus Left medial femoral chondral defect  Procedure- Left knee arthroscopy with medial Meniscal debridement and chondroplasty   Surgeon- Gus Rankin. Rilynne Lonsway, MD  Anesthesia-General  EBL-  minimal Complications- None  Condition- PACU - hemodynamically stable.  Brief clinical note- -[name] is a 62 y.o.  female with a several month history of left knee pain and mechanical symptoms. Exam and history suggested medial meniscal tear confirmed by MRI. The patient presents now for arthroscopy and debridement   Procedure in detail -       After successful administration of General anesthetic, a tourmiquet is placed high on the Left  thigh and the Left lower extremity is prepped and draped in the usual sterile fashion. Time out is performed by the surgical team. Standard superomedial and inferolateral portal sites are marked and incisions made with an 11 blade. The inflow cannula is passed through the superomedial portal and camera through the inferolateral portal and inflow is initiated. Arthroscopic visualization proceeds.      The undersurface of the patella and trochlea are visualized and there is mild chondromalacia but no unstable chondral defects.. The medial and lateral gutters are visualized and there are  no loose bodies. Flexion and valgus force is applied to the knee and the medial compartment is entered. A spinal needle is passed into the joint through the site marked for the inferomedial portal. A small incision is made and the dilator passed into the joint. The findings for the medial compartment are medial meniscal tear posterior horn which is unstable and 1 x 2 cm unstable chondral defect medial femoral condyle . The tear is debrided to a stable base with baskets and a shaver and sealed off with the Arthrocare. The shaver is used to debride the unstable cartilage to a  stable cartilaginous base with stable edges. It is probed and found to be stable.    The intercondylar notch is visualized and the ACL appears normal. The lateral compartment is entered and the findings are normal .      The joint is again inspected and there are no other tears, defects or loose bodies identified. The arthroscopic equipment is then removed from the inferior portals which are closed with interrupted 4-0 nylon. 20 ml of .25% Marcaine with epinephrine are injected through the inflow cannula and the cannula is then removed and the portal closed with nylon. The incisions are cleaned and dried and a bulky sterile dressing is applied. The patient is then awakened and transported to recovery in stable condition.   04/22/2013, 10:59 AM

## 2013-04-22 NOTE — Transfer of Care (Signed)
Immediate Anesthesia Transfer of Care Note  Patient: [name]  Procedure(s) Performed: Procedure(s): LEFT KNEE ARTHROSCOPY WITH medial meniscusectomy and chondroplasty (Left)  Patient Location: PACU  Anesthesia Type:General  Level of Consciousness: awake, alert , oriented and patient cooperative  Airway & Oxygen Therapy: Patient Spontanous Breathing and Patient connected to face mask oxygen  Post-op Assessment: Report given to PACU RN and Post -op Vital signs reviewed and stable  Post vital signs: Reviewed and stable  Complications: No apparent anesthesia complications

## 2013-04-22 NOTE — Anesthesia Postprocedure Evaluation (Signed)
  Anesthesia Post-op Note  Patient: [name]  Procedure(s) Performed: Procedure(s) (LRB): LEFT KNEE ARTHROSCOPY WITH medial meniscusectomy and chondroplasty (Left)  Patient Location: PACU  Anesthesia Type: General  Level of Consciousness: awake and alert   Airway and Oxygen Therapy: Patient Spontanous Breathing  Post-op Pain: mild  Post-op Assessment: Post-op Vital signs reviewed, Patient's Cardiovascular Status Stable, Respiratory Function Stable, Patent Airway and No signs of Nausea or vomiting  Last Vitals:  Filed Vitals:   04/22/13 1130  BP: 130/73  Pulse: 46  Temp:   Resp: 19    Post-op Vital Signs: stable   Complications: No apparent anesthesia complications
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
## 2013-04-22 NOTE — Preoperative (Signed)
Beta Blockers   Reason not to administer Beta Blockers:Not Applicable 

## 2013-04-23 ENCOUNTER — Encounter (HOSPITAL_COMMUNITY): Payer: Self-pay | Admitting: Orthopedic Surgery

## 2013-05-26 ENCOUNTER — Other Ambulatory Visit: Payer: BC Managed Care – PPO

## 2013-06-01 ENCOUNTER — Other Ambulatory Visit: Payer: BC Managed Care – PPO | Admitting: Physician Assistant

## 2013-06-19 ENCOUNTER — Other Ambulatory Visit: Payer: Self-pay | Admitting: Physician Assistant

## 2013-06-22 ENCOUNTER — Other Ambulatory Visit: Payer: Self-pay | Admitting: Family Medicine

## 2013-06-22 NOTE — Telephone Encounter (Signed)
Simvastatin 10mg   #90

## 2013-06-22 NOTE — Telephone Encounter (Signed)
Medication refilled per protocol. 

## 2013-06-23 MED ORDER — SIMVASTATIN 10 MG PO TABS: 10.0000 mg | ORAL_TABLET | Freq: Every day | ORAL | Status: DC

## 2013-06-23 NOTE — Telephone Encounter (Signed)
Medication refilled per protocol. 

## 2013-06-24 ENCOUNTER — Other Ambulatory Visit: Payer: Self-pay | Admitting: Family Medicine

## 2013-06-24 MED ORDER — SIMVASTATIN 10 MG PO TABS: 10.0000 mg | ORAL_TABLET | Freq: Every day | ORAL | Status: DC

## 2013-06-24 NOTE — Telephone Encounter (Signed)
.  Rx Refilled - pt has CPE schedule in December

## 2013-08-26 ENCOUNTER — Other Ambulatory Visit: Payer: BC Managed Care – PPO

## 2013-08-26 DIAGNOSIS — Z0000 Encounter for general adult medical examination without abnormal findings: Secondary | ICD-10-CM

## 2013-08-26 LAB — CBC WITH DIFFERENTIAL/PLATELET
Basophils Absolute: 0 10*3/uL (ref 0.0–0.1)
Basophils Relative: 0 % (ref 0–1)
Eosinophils Absolute: 0.3 10*3/uL (ref 0.0–0.7)
HCT: 42.1 % (ref 36.0–46.0)
Hemoglobin: 13.8 g/dL (ref 12.0–15.0)
MCH: 27.9 pg (ref 26.0–34.0)
MCHC: 32.8 g/dL (ref 30.0–36.0)
Monocytes Absolute: 0.5 10*3/uL (ref 0.1–1.0)
Monocytes Relative: 10 % (ref 3–12)
Neutro Abs: 2.2 10*3/uL (ref 1.7–7.7)
Neutrophils Relative %: 46 % (ref 43–77)
RDW: 14.1 % (ref 11.5–15.5)

## 2013-08-26 LAB — COMPREHENSIVE METABOLIC PANEL
AST: 21 U/L (ref 0–37)
Alkaline Phosphatase: 49 U/L (ref 39–117)
BUN: 14 mg/dL (ref 6–23)
Glucose, Bld: 93 mg/dL (ref 70–99)
Potassium: 4 mEq/L (ref 3.5–5.3)
Total Bilirubin: 0.5 mg/dL (ref 0.3–1.2)

## 2013-08-26 LAB — LIPID PANEL
HDL: 57 mg/dL (ref >39)
LDL Cholesterol: 102 mg/dL — ABNORMAL HIGH (ref 0–99)
Total CHOL/HDL Ratio: 3.2 Ratio
Triglycerides: 111 mg/dL (ref <150)
VLDL: 22 mg/dL (ref 0–40)

## 2013-08-26 LAB — TSH: TSH: 4.286 u[IU]/mL (ref 0.350–4.500)

## 2013-08-31 ENCOUNTER — Ambulatory Visit (INDEPENDENT_AMBULATORY_CARE_PROVIDER_SITE_OTHER): Payer: BC Managed Care – PPO | Admitting: Physician Assistant

## 2013-08-31 ENCOUNTER — Encounter: Payer: Self-pay | Admitting: Physician Assistant

## 2013-08-31 VITALS — BP 122/82 | HR 68 | Temp 98.6°F | Resp 18 | Ht 69.0 in | Wt 208.0 lb

## 2013-08-31 DIAGNOSIS — I10 Essential (primary) hypertension: Secondary | ICD-10-CM | POA: Insufficient documentation

## 2013-08-31 DIAGNOSIS — E669 Obesity, unspecified: Secondary | ICD-10-CM

## 2013-08-31 DIAGNOSIS — Z86010 Personal history of colonic polyps: Secondary | ICD-10-CM

## 2013-08-31 DIAGNOSIS — F419 Anxiety disorder, unspecified: Secondary | ICD-10-CM

## 2013-08-31 DIAGNOSIS — E785 Hyperlipidemia, unspecified: Secondary | ICD-10-CM

## 2013-08-31 DIAGNOSIS — Z0000 Encounter for general adult medical examination without abnormal findings: Secondary | ICD-10-CM

## 2013-08-31 DIAGNOSIS — F411 Generalized anxiety disorder: Secondary | ICD-10-CM

## 2013-08-31 DIAGNOSIS — R3129 Other microscopic hematuria: Secondary | ICD-10-CM

## 2013-08-31 MED ORDER — HYDROCHLOROTHIAZIDE 25 MG PO TABS: 25.0000 mg | ORAL_TABLET | Freq: Every morning | ORAL | Status: DC

## 2013-08-31 MED ORDER — ESCITALOPRAM OXALATE 10 MG PO TABS: 10.0000 mg | ORAL_TABLET | Freq: Every day | ORAL | Status: DC

## 2013-08-31 MED ORDER — SIMVASTATIN 10 MG PO TABS: 10.0000 mg | ORAL_TABLET | Freq: Every day | ORAL | Status: DC

## 2013-08-31 NOTE — Progress Notes (Signed)
Patient ID: [name] MRN: [id number], DOB: [birth year], 62 y.o. Date of Encounter: 08/31/2013,   Chief Complaint: Physical (CPE)  HPI: 62 y.o. y/o white female  here for CPE.   Hypertension: She is still taking HCTZ daily. No adverse effects. No lightheadedness.  Hyperlipidemia: In the past we gave her a trial for diet and exercise but she did not make changes and reported that she knew she would not be compliant with making changes. She is now on simvastatin 10 mg. No myalgias or other adverse effects.  Anxiety: At her last office visit in May she was on Lexapro and said this was working well and her anxiety was well controlled with this.   However, today she says that several months ago, when she had knee surgery -- she forgot to take the Lexapro. She says that after that she just stayed off of it. However she feels that she does need to get back on the medication. She does often feel anxious and overwhelmed. She says in the past she was using the Klonopin at night to help with insomnia. However she says that she is no longer having any problems with sleeping and has not been taking the medication at all.   Review of Systems: Consitutional: No fever, chills, fatigue, night sweats, lymphadenopathy. No significant/unexplained weight changes. Eyes: No visual changes, eye redness, or discharge. ENT/Mouth: No ear pain, sore throat, nasal drainage, or sinus pain. Cardiovascular: No chest pressure,heaviness, tightness or squeezing, even with exertion. No increased shortness of breath or dyspnea on exertion.No palpitations, edema, orthopnea, PND. Respiratory: No cough, hemoptysis, SOB, or wheezing. Gastrointestinal: No anorexia, dysphagia, reflux, pain, nausea, vomiting, hematemesis, diarrhea, constipation, BRBPR, or melena. Breast: No mass, nodules, bulging, or retraction. No skin changes or inflammation. No nipple discharge. No lymphadenopathy. Genitourinary: No dysuria, hematuria,  incontinence, vaginal discharge, pruritis, burning, abnormal bleeding, or pain. Musculoskeletal: No decreased ROM, No joint pain or swelling. No significant pain in neck, back, or extremities. Skin: No rash, pruritis, or concerning lesions. Neurological: No headache, dizziness, syncope, seizures, tremors, memory loss, coordination problems, or paresthesias. Psychological: No anxiety, depression, hallucinations, SI/HI. Endocrine: No polydipsia, polyphagia, polyuria, or known diabetes.No increased fatigue. No palpitations/rapid heart rate. No significant/unexplained weight change. All other systems were reviewed and are otherwise negative.  Past Medical History  Diagnosis Date  . Microscopic hematuria   . GERD (gastroesophageal reflux disease)   . Hyperlipidemia   . Anxiety   . Obesity   . Heart murmur   . Arthritis     knees   . Hypertension      Past Surgical History  Procedure Laterality Date  . Tonsillectomy    . Tubal ligation    . Knee arthroscopy Left 04/22/2013    Procedure: LEFT KNEE ARTHROSCOPY WITH medial meniscusectomy and chondroplasty;  Surgeon: Loanne Drilling, MD;  Location: [facility];  Service: Orthopedics;  Laterality: Left;    Home Meds:  Current Outpatient Prescriptions on File Prior to Visit  Medication Sig Dispense Refill  . aspirin 81 MG tablet Take 81 mg by mouth at bedtime.       . calcium carbonate (OS-CAL) 600 MG TABS Take 600 mg by mouth daily.      . cholecalciferol (VITAMIN D) 1000 UNITS tablet Take 1,000 Units by mouth daily.       . fish oil-omega-3 fatty acids 1000 MG capsule Take 2 g by mouth daily.       Marland Kitchen ibuprofen (ADVIL,MOTRIN) 200 MG tablet Take  600 mg by mouth every 6 (six) hours as needed. For pain      . Multiple Vitamin (MULTIVITAMIN) tablet Take 1 tablet by mouth daily.        Marland Kitchen omeprazole (PRILOSEC) 20 MG capsule Take 20 mg by mouth daily.         No current facility-administered medications on file prior to visit.    Allergies: No  Known Allergies  History   Social History  . Marital Status: Married    Spouse Name: N/A    Number of Children: N/A  . Years of Education: N/A   Occupational History  . Not on file.   Social History Main Topics  . Smoking status: Never Smoker   . Smokeless tobacco: Never Used  . Alcohol Use: Yes     Comment: rare  . Drug Use: No  . Sexual Activity: Yes    Birth Control/ Protection: Surgical   Other Topics Concern  . Not on file   Social History Narrative   Works in [employer].   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       No other exercise.   Married.       Never smoked.    Family History  Problem Relation Age of Onset  . Heart disease Mother 52    MI at 47 and 4  . Cancer Father 71    Prostate Cancer    Physical Exam: Blood pressure 122/82, pulse 68, temperature 98.6 F (37 C), resp. rate 18, height 5\' 9"  (1.753 m), weight 208 lb (94.348 kg)., Body mass index is 30.7 kg/(m^2). General: Well developed, well nourished, in no acute distress. HEENT: Normocephalic, atraumatic. Conjunctiva pink, sclera non-icteric. Pupils 2 mm constricting to 1 mm, round, regular, and equally reactive to light and accomodation. EOMI. Internal auditory canal clear. TMs with good cone of light and without pathology. Nasal mucosa pink. Nares are without discharge. No sinus tenderness. Oral mucosa pink.  Pharynx without exudate.   Neck: Supple. Trachea midline. No thyromegaly. Full ROM. No lymphadenopathy.No Carotid Bruits. Lungs: Clear to auscultation bilaterally without wheezes, rales, or rhonchi. Breathing is of normal effort and unlabored. Cardiovascular: RRR with S1 S2. No murmurs, rubs, or gallops. Distal pulses 2+ symmetrically. No carotid or abdominal bruits. Breast: Symmetrical. No masses. Nipples without discharge. Abdomen: Soft, non-tender, non-distended with normoactive bowel sounds. No hepatosplenomegaly or masses. No rebound/guarding. No CVA tenderness. No hernias.    Genitourinary:  External genitalia without lesions. Vaginal mucosa pink.No discharge present. Cervix pink and without discharge. No cervical tenderness.Normal uterus size. No adnexal mass or tenderness.  Musculoskeletal: Full range of motion and 5/5 strength throughout. Without swelling, atrophy, tenderness, crepitus, or warmth. Extremities without clubbing, cyanosis, or edema. Calves supple. Skin: Warm and moist without erythema, ecchymosis, wounds, or rash. Neuro: A+Ox3. CN II-XII grossly intact. Moves all extremities spontaneously. Full sensation throughout. Normal gait. DTR 2+ throughout upper and lower extremities. Finger to nose intact. Psych:  Responds to questions appropriately with a normal affect.   Assessment/Plan:  62 y.o. y/o female here for CPE 1. Visit for preventive health examination  A. Screening Labs: Results for orders placed in visit on 08/26/13  CBC WITH DIFFERENTIAL      Result Value Range   WBC 4.9  4.0 - 10.5 K/uL   RBC 4.95  3.87 - 5.11 MIL/uL   Hemoglobin 13.8  12.0 - 15.0 g/dL   HCT 54.0  98.1 - 19.1 %   MCV 85.1  78.0 -  100.0 fL   MCH 27.9  26.0 - 34.0 pg   MCHC 32.8  30.0 - 36.0 g/dL   RDW 40.9  81.1 - 91.4 %   Platelets 220  150 - 400 K/uL   Neutrophils Relative % 46  43 - 77 %   Neutro Abs 2.2  1.7 - 7.7 K/uL   Lymphocytes Relative 38  12 - 46 %   Lymphs Abs 1.9  0.7 - 4.0 K/uL   Monocytes Relative 10  3 - 12 %   Monocytes Absolute 0.5  0.1 - 1.0 K/uL   Eosinophils Relative 6 (*) 0 - 5 %   Eosinophils Absolute 0.3  0.0 - 0.7 K/uL   Basophils Relative 0  0 - 1 %   Basophils Absolute 0.0  0.0 - 0.1 K/uL   Smear Review Criteria for review not met    COMPREHENSIVE METABOLIC PANEL      Result Value Range   Sodium 142  135 - 145 mEq/L   Potassium 4.0  3.5 - 5.3 mEq/L   Chloride 103  96 - 112 mEq/L   CO2 31  19 - 32 mEq/L   Glucose, Bld 93  70 - 99 mg/dL   BUN 14  6 - 23 mg/dL   Creat 7.82  9.56 - 2.13 mg/dL   Total Bilirubin 0.5  0.3 - 1.2 mg/dL    Alkaline Phosphatase 49  39 - 117 U/L   AST 21  0 - 37 U/L   ALT 23  0 - 35 U/L   Total Protein 6.3  6.0 - 8.3 g/dL   Albumin 4.3  3.5 - 5.2 g/dL   Calcium 9.8  8.4 - 08.6 mg/dL  LIPID PANEL      Result Value Range   Cholesterol 181  0 - 200 mg/dL   Triglycerides 578  <469 mg/dL   HDL 57  >62 mg/dL   Total CHOL/HDL Ratio 3.2     VLDL 22  0 - 40 mg/dL   LDL Cholesterol 952 (*) 0 - 99 mg/dL  TSH      Result Value Range   TSH 4.286  0.350 - 4.500 uIU/mL  VITAMIN D 25 HYDROXY      Result Value Range   Vit D, 25-Hydroxy 62  30 - 89 ng/mL    B. Pap: Last smear was performed 04/18/2011. Normal. Cytology was normal and HPV was also negative. Can wait to repeat this. Pelvic exam is normal today.  C. Screening Mammogram: Patient states that she did have followup mammogram for the year 2014.  D. DEXA/BMD: Will wait a couple more years to start obtaining these. She is on Os-Cal and vitamin D. Vitamin D level is within normal limits on current dose.  E. Colorectal Cancer Screening:  She had colonoscopy performed 10/09/2007. Positive diverticulosis. No polyps. Reports stated to repeat 7 years.  F. Immunizations:  Influenza: Patient states she did have the influenza vaccine for this year [facility]. Tetanus: Given here August 2012 Pneumococcal: We'll discuss at age 86 Zostavax: Patient is now age 56. I told her to call her insurance company to discuss coverage. I told her that if cost were not an issue I would recommend her getting this. She will call them regarding the cost and then if she wants to proceed with getting this she will call us for a prescription and then go to the pharmacy to get his immunization. Discussed all this with her today.    2. Hyperlipidemia  FLP at goal. LFTs normal. Continue current medication. - simvastatin (ZOCOR) 10 MG tablet; Take 1 tablet (10 mg total) by mouth at bedtime.  Dispense: 90 tablet; Refill: 1  3. Hypertension Blood pressure at goal. Bmet normal.  Continue current medication. - hydrochlorothiazide (HYDRODIURIL) 25 MG tablet; Take 1 tablet (25 mg total) by mouth every morning.  Dispense: 90 tablet; Refill: 1  4. Anxiety Restart Lexapro 10 mg. Can stay off of the Klonopin. - escitalopram (LEXAPRO) 10 MG tablet; Take 1 tablet (10 mg total) by mouth daily.  Dispense: 90 tablet; Refill: 1  5. Obesity She is aware of need for diet and exercise. However when we recommended this for her cholesterol she was noncompliant.  6. COLONIC POLYPS, HX OF  7.H/O Microscopic hematuria--history of evaluation by urology.  She will need followup in 6 months for fasting labs and office visit. Followup sooner if needed.  [address] [place], [place], [facility] 08/31/2013 12:01 PM

## 2013-09-21 ENCOUNTER — Telehealth: Payer: Self-pay | Admitting: Family Medicine

## 2013-09-21 NOTE — Telephone Encounter (Signed)
Pt is needing a prescription for the shingles shot she said that the insurance will cover it Call back number is [phone]

## 2013-09-22 MED ORDER — ZOSTER VACCINE LIVE 19400 UNT/0.65ML ~~LOC~~ SOLR: 0.6500 mL | Freq: Once | SUBCUTANEOUS | Status: DC

## 2013-09-22 NOTE — Telephone Encounter (Signed)
rx for shingles injection sent to pharm.

## 2014-02-13 ENCOUNTER — Other Ambulatory Visit: Payer: Self-pay | Admitting: Physician Assistant

## 2014-02-13 DIAGNOSIS — E785 Hyperlipidemia, unspecified: Secondary | ICD-10-CM

## 2014-02-15 NOTE — Telephone Encounter (Signed)
Medication refilled per protocol. 6 mth visit in 2 weeks

## 2014-03-03 ENCOUNTER — Encounter: Payer: Self-pay | Admitting: Physician Assistant

## 2014-03-03 ENCOUNTER — Encounter: Payer: Self-pay | Admitting: Family Medicine

## 2014-03-03 ENCOUNTER — Ambulatory Visit (INDEPENDENT_AMBULATORY_CARE_PROVIDER_SITE_OTHER): Payer: 59 | Admitting: Physician Assistant

## 2014-03-03 VITALS — BP 114/80 | HR 60 | Temp 98.4°F | Resp 18 | Wt 212.0 lb

## 2014-03-03 DIAGNOSIS — Z86010 Personal history of colonic polyps: Secondary | ICD-10-CM

## 2014-03-03 DIAGNOSIS — F411 Generalized anxiety disorder: Secondary | ICD-10-CM

## 2014-03-03 DIAGNOSIS — F419 Anxiety disorder, unspecified: Secondary | ICD-10-CM

## 2014-03-03 DIAGNOSIS — E669 Obesity, unspecified: Secondary | ICD-10-CM

## 2014-03-03 DIAGNOSIS — E785 Hyperlipidemia, unspecified: Secondary | ICD-10-CM

## 2014-03-03 DIAGNOSIS — I10 Essential (primary) hypertension: Secondary | ICD-10-CM

## 2014-03-03 MED ORDER — ESCITALOPRAM OXALATE 10 MG PO TABS: 10.0000 mg | ORAL_TABLET | Freq: Every day | ORAL | Status: DC

## 2014-03-03 NOTE — Progress Notes (Signed)
Patient ID: [name] MRN: [id number], DOB: [birth year], 63 y.o. Date of Encounter: 03/03/2014,   Chief Complaint: Routine F//U  HPI: 63 y.o. y/o white female  here for routine 6 month f/u OV.  She had a CPE as her LOV --08/31/2013.   Hypertension: She is still taking HCTZ daily. No adverse effects. No lightheadedness.  Hyperlipidemia: In the past we gave her a trial for diet and exercise but she did not make changes and reported that she knew she would not be compliant with making changes. She is now on simvastatin 10 mg. No myalgias or other adverse effects.  Anxiety: At her  office visit in May 2014 she was on Lexapro and said this was working well and her anxiety was well controlled with this.   However, at [place] 08/2013 she reported that several months prior, when she had knee surgery -- she forgot to take the Lexapro. She says that after that she just stayed off of it. However at  [place] 08/2013  she felt that she needed to get back on the medication.Off of medication,  she was often feeling anxious and overwhelmed. In the past she was using Klonopin at night to help with insomnia. However, she is no longer having any problems with sleeping and has not been taking the medication at all.  She had to quit her job in March. Says that she had surgery for torn meniscus. 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 Had significant knee pain. Discussed with her husband--"just couldn't do it any more"--quit working.   Review of Systems: All other pertinent ROS negative.   Past Medical History  Diagnosis Date  . Microscopic hematuria   . GERD (gastroesophageal reflux disease)   . Hyperlipidemia   . Anxiety   . Obesity   . Heart murmur   . Arthritis     knees   . Hypertension      Past Surgical History  Procedure Laterality Date  . Tonsillectomy    . Tubal ligation    . Knee arthroscopy  Left 04/22/2013    Procedure: LEFT KNEE ARTHROSCOPY WITH medial meniscusectomy and chondroplasty;  Surgeon: Gearlean Alf, MD;  Location: [facility];  Service: Orthopedics;  Laterality: Left;    Home Meds:  Current Outpatient Prescriptions on File Prior to Visit  Medication Sig Dispense Refill  . aspirin 81 MG tablet Take 81 mg by mouth at bedtime.       . calcium carbonate (OS-CAL) 600 MG TABS Take 600 mg by mouth daily.      . cholecalciferol (VITAMIN D) 1000 UNITS tablet Take 1,000 Units by mouth daily.       Marland Kitchen escitalopram (LEXAPRO) 10 MG tablet Take 1 tablet (10 mg total) by mouth daily.  90 tablet  1  . fish oil-omega-3 fatty acids 1000 MG capsule Take 2 g by mouth daily.       . hydrochlorothiazide (HYDRODIURIL) 25 MG tablet Take 1 tablet (25 mg total) by mouth every morning.  90 tablet  1  . ibuprofen (ADVIL,MOTRIN) 200 MG tablet Take 600 mg by mouth every 6 (six) hours as needed. For pain      . Multiple Vitamin (MULTIVITAMIN) tablet Take 1 tablet by mouth daily.        Marland Kitchen omeprazole (PRILOSEC) 20 MG capsule Take 20 mg by mouth daily.        Marland Kitchen  simvastatin (ZOCOR) 10 MG tablet TAKE 1 TABLET AT BEDTIME  30 tablet  0   No current facility-administered medications on file prior to visit.    Allergies: No Known Allergies  History   Social History  . Marital Status: Married    Spouse Name: N/A    Number of Children: N/A  . Years of Education: N/A   Occupational History  . Not on file.   Social History Main Topics  . Smoking status: Never Smoker   . Smokeless tobacco: Never Used  . Alcohol Use: Yes     Comment: rare  . Drug Use: No  . Sexual Activity: Yes    Birth Control/ Protection: Surgical   Other Topics Concern  . Not on file   Social History Narrative   Works in [employer].   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       No other exercise.   Married.       Never smoked.    Family History  Problem Relation Age of Onset  . Heart disease Mother 74    MI  at 62 and 11  . Cancer Father 12    Prostate Cancer    Physical Exam: Blood pressure 114/80, pulse 60, temperature 98.4 F (36.9 C), temperature source Oral, resp. rate 18, weight 212 lb (96.163 kg)., Body mass index is 31.29 kg/(m^2). General: Obese WF. Appears  in no acute distress. Neck: Supple. Trachea midline. No thyromegaly. Full ROM. No lymphadenopathy.No Carotid Bruits. Lungs: Clear to auscultation bilaterally without wheezes, rales, or rhonchi. Breathing is of normal effort and unlabored. Cardiovascular: RRR with S1 S2. No murmurs, rubs, or gallops. Distal pulses 2+ symmetrically. No carotid or abdominal bruits. Abdomen: Soft, non-tender, non-distended with normoactive bowel sounds. No hepatosplenomegaly or masses. No rebound/guarding. No CVA tenderness. No hernias.  Musculoskeletal: Strength and tone normal for her age.  Skin: Warm and moist without erythema, ecchymosis, wounds, or rash. Neuro: A+Ox3. CN II-XII grossly intact. Moves all extremities spontaneously. Full sensation throughout. Normal gait. Psych:  Responds to questions appropriately with a normal affect.   Assessment/Plan:  63 y.o. y/o female here for CPE   1. Hyperlipidemia Recheck FLP/LFT now. -On Simvastatin 10mg .   2. Hypertension Blood pressure at goal. Continue current medication.Check BMET to monitor.  - hydrochlorothiazide (HYDRODIURIL) 25 MG tablet; Take 1 tablet (25 mg total) by mouth every morning.  Dispense: 90 tablet; Refill: 1  3. Anxiety Controlled with  Lexapro 10 mg.   4. Obesity She is aware of need for diet and exercise. However when we recommended this for her cholesterol she was noncompliant.  5. COLONIC POLYPS, HX OF  6.H/O Microscopic hematuria--history of evaluation by urology.  7. Preventive Care--She had CPE 08/2013. The following was updated at that time: A. Screening Labs: She had a full panel of screening labs 08/26/2013 including  CBC, CMET, TSH, vitamin D. All of these  were normal.  Lipid panel was excellent with triglycerides 111, HDL 57, LDL 102.  B. Pap: Last smear was performed 04/18/2011. Normal. Cytology was normal and HPV was also negative. Can wait to repeat this. Pelvic exam is normal 08/2013.  C. Screening Mammogram: Patient states that she did have followup mammogram for the year 2014.  D. DEXA/BMD: Will wait a couple more years to start obtaining these. She is on Os-Cal and vitamin D. Vitamin D level is within normal limits on current dose.  E. Colorectal Cancer Screening:  She had colonoscopy performed 10/09/2007. Positive diverticulosis.  No polyps. Reports stated to repeat 7 years.  F. Immunizations:  Influenza: Patient states she did have the influenza vaccine [facility]. Tetanus: Given here August 2012 Pneumococcal: We'll discuss at age 57 Zostavax:  Insurance covered 100%--received this in January 2015.   She will need followup in 6 months for fasting labs and office visit. Followup sooner if needed.  Signed, [address] [place], [place], [facility] 03/03/2014 11:56 AM

## 2014-03-04 LAB — COMPLETE METABOLIC PANEL WITH GFR
ALBUMIN: 3.9 g/dL (ref 3.5–5.2)
ALT: 15 U/L (ref 0–35)
AST: 14 U/L (ref 0–37)
Alkaline Phosphatase: 47 U/L (ref 39–117)
BUN: 17 mg/dL (ref 6–23)
CALCIUM: 9.3 mg/dL (ref 8.4–10.5)
CHLORIDE: 104 meq/L (ref 96–112)
CO2: 27 mEq/L (ref 19–32)
Creat: 0.66 mg/dL (ref 0.50–1.10)
GFR, Est African American: >89 mL/min
GFR, Est Non African American: >89 mL/min
Glucose, Bld: 83 mg/dL (ref 70–99)
POTASSIUM: 3.9 meq/L (ref 3.5–5.3)
Sodium: 142 mEq/L (ref 135–145)
Total Bilirubin: 0.4 mg/dL (ref 0.2–1.2)
Total Protein: 6.5 g/dL (ref 6.0–8.3)

## 2014-03-04 LAB — LIPID PANEL
CHOL/HDL RATIO: 3.4 ratio
Cholesterol: 192 mg/dL (ref 0–200)
HDL: 56 mg/dL (ref >39)
LDL CALC: 116 mg/dL — AB (ref 0–99)
TRIGLYCERIDES: 100 mg/dL (ref <150)
VLDL: 20 mg/dL (ref 0–40)

## 2014-03-29 LAB — HM MAMMOGRAPHY: HM Mammogram: NORMAL

## 2014-04-09 ENCOUNTER — Encounter: Payer: Self-pay | Admitting: Internal Medicine

## 2014-04-21 ENCOUNTER — Encounter: Payer: Self-pay | Admitting: Internal Medicine

## 2014-05-25 ENCOUNTER — Telehealth: Payer: Self-pay | Admitting: Family Medicine

## 2014-05-25 DIAGNOSIS — I10 Essential (primary) hypertension: Secondary | ICD-10-CM

## 2014-05-25 NOTE — Telephone Encounter (Signed)
[phone]  Pt is needing a reifll on hydrochlorothiazide (HYDRODIURIL) 25 MG tablet [facility]

## 2014-05-26 MED ORDER — HYDROCHLOROTHIAZIDE 25 MG PO TABS: 25.0000 mg | ORAL_TABLET | Freq: Every morning | ORAL | Status: DC

## 2014-05-26 NOTE — Telephone Encounter (Signed)
Med sent to pharm 

## 2014-06-18 ENCOUNTER — Telehealth: Payer: Self-pay | Admitting: Family Medicine

## 2014-06-18 DIAGNOSIS — I10 Essential (primary) hypertension: Secondary | ICD-10-CM

## 2014-06-18 MED ORDER — HYDROCHLOROTHIAZIDE 25 MG PO TABS: 25.0000 mg | ORAL_TABLET | Freq: Every morning | ORAL | Status: DC

## 2014-06-18 NOTE — Telephone Encounter (Signed)
Medication refilled per protocol. 

## 2014-06-21 ENCOUNTER — Telehealth: Payer: Self-pay | Admitting: Family Medicine

## 2014-06-21 DIAGNOSIS — I10 Essential (primary) hypertension: Secondary | ICD-10-CM

## 2014-06-21 MED ORDER — HYDROCHLOROTHIAZIDE 25 MG PO TABS: 25.0000 mg | ORAL_TABLET | Freq: Every morning | ORAL | Status: DC

## 2014-06-21 NOTE — Telephone Encounter (Signed)
Medication refilled per protocol. 

## 2014-06-22 ENCOUNTER — Telehealth: Payer: Self-pay | Admitting: Family Medicine

## 2014-06-22 DIAGNOSIS — I10 Essential (primary) hypertension: Secondary | ICD-10-CM

## 2014-06-22 NOTE — Telephone Encounter (Signed)
error 

## 2014-06-25 ENCOUNTER — Encounter: Payer: Self-pay | Admitting: Family Medicine

## 2014-07-15 ENCOUNTER — Ambulatory Visit (INDEPENDENT_AMBULATORY_CARE_PROVIDER_SITE_OTHER): Payer: 59 | Admitting: *Deleted

## 2014-07-15 DIAGNOSIS — Z23 Encounter for immunization: Secondary | ICD-10-CM

## 2014-07-15 NOTE — Progress Notes (Signed)
Patient ID: [name], female   DOB: [birth year], 63 y.o.   MRN: [id number] Patient seen in office for Influenza Vaccination.   Tolerated IM administration well.

## 2014-07-20 ENCOUNTER — Telehealth: Payer: Self-pay | Admitting: Family Medicine

## 2014-07-20 DIAGNOSIS — E785 Hyperlipidemia, unspecified: Secondary | ICD-10-CM

## 2014-07-20 MED ORDER — SIMVASTATIN 10 MG PO TABS: 10.0000 mg | ORAL_TABLET | Freq: Every day | ORAL | Status: DC

## 2014-07-20 NOTE — Telephone Encounter (Signed)
Medication refilled per protocol. 

## 2014-08-11 ENCOUNTER — Other Ambulatory Visit: Payer: Self-pay | Admitting: Physician Assistant

## 2014-08-11 ENCOUNTER — Encounter: Payer: Self-pay | Admitting: Family Medicine

## 2014-08-11 NOTE — Telephone Encounter (Signed)
Medication refilled per protocol. 

## 2014-09-02 ENCOUNTER — Other Ambulatory Visit: Payer: 59

## 2014-09-02 DIAGNOSIS — Z79899 Other long term (current) drug therapy: Secondary | ICD-10-CM

## 2014-09-02 DIAGNOSIS — I10 Essential (primary) hypertension: Secondary | ICD-10-CM

## 2014-09-02 DIAGNOSIS — Z0000 Encounter for general adult medical examination without abnormal findings: Secondary | ICD-10-CM

## 2014-09-02 DIAGNOSIS — E785 Hyperlipidemia, unspecified: Secondary | ICD-10-CM

## 2014-09-02 DIAGNOSIS — E669 Obesity, unspecified: Secondary | ICD-10-CM

## 2014-09-03 LAB — LIPID PANEL
CHOL/HDL RATIO: 2.6 ratio
Cholesterol: 158 mg/dL (ref 0–200)
HDL: 60 mg/dL (ref >39)
LDL Cholesterol: 84 mg/dL (ref 0–99)
TRIGLYCERIDES: 69 mg/dL (ref <150)
VLDL: 14 mg/dL (ref 0–40)

## 2014-09-03 LAB — CBC WITH DIFFERENTIAL/PLATELET
BASOS ABS: 0.1 10*3/uL (ref 0.0–0.1)
BASOS PCT: 1 % (ref 0–1)
EOS ABS: 0.2 10*3/uL (ref 0.0–0.7)
EOS PCT: 4 % (ref 0–5)
HEMATOCRIT: 41.8 % (ref 36.0–46.0)
Hemoglobin: 13.8 g/dL (ref 12.0–15.0)
Lymphocytes Relative: 32 % (ref 12–46)
Lymphs Abs: 1.7 10*3/uL (ref 0.7–4.0)
MCH: 28.2 pg (ref 26.0–34.0)
MCHC: 33 g/dL (ref 30.0–36.0)
MCV: 85.5 fL (ref 78.0–100.0)
MONO ABS: 0.5 10*3/uL (ref 0.1–1.0)
MONOS PCT: 10 % (ref 3–12)
MPV: 9.7 fL (ref 9.4–12.4)
NEUTROS ABS: 2.8 10*3/uL (ref 1.7–7.7)
Neutrophils Relative %: 53 % (ref 43–77)
PLATELETS: 257 10*3/uL (ref 150–400)
RBC: 4.89 MIL/uL (ref 3.87–5.11)
RDW: 14.5 % (ref 11.5–15.5)
WBC: 5.3 10*3/uL (ref 4.0–10.5)

## 2014-09-03 LAB — COMPLETE METABOLIC PANEL WITH GFR
ALBUMIN: 3.9 g/dL (ref 3.5–5.2)
ALK PHOS: 44 U/L (ref 39–117)
ALT: 17 U/L (ref 0–35)
AST: 18 U/L (ref 0–37)
BILIRUBIN TOTAL: 0.4 mg/dL (ref 0.2–1.2)
BUN: 13 mg/dL (ref 6–23)
CO2: 26 mEq/L (ref 19–32)
Calcium: 9 mg/dL (ref 8.4–10.5)
Chloride: 104 mEq/L (ref 96–112)
Creat: 0.73 mg/dL (ref 0.50–1.10)
GFR, Est African American: >89 mL/min
GFR, Est Non African American: 89 mL/min
Glucose, Bld: 85 mg/dL (ref 70–99)
POTASSIUM: 3.9 meq/L (ref 3.5–5.3)
SODIUM: 142 meq/L (ref 135–145)
Total Protein: 6.1 g/dL (ref 6.0–8.3)

## 2014-09-03 LAB — TSH: TSH: 2.297 u[IU]/mL (ref 0.350–4.500)

## 2014-09-08 ENCOUNTER — Other Ambulatory Visit: Payer: Self-pay | Admitting: Family Medicine

## 2014-09-08 ENCOUNTER — Encounter: Payer: Self-pay | Admitting: Physician Assistant

## 2014-09-08 ENCOUNTER — Ambulatory Visit (INDEPENDENT_AMBULATORY_CARE_PROVIDER_SITE_OTHER): Payer: 59 | Admitting: Physician Assistant

## 2014-09-08 VITALS — BP 124/80 | HR 60 | Temp 98.0°F | Resp 18 | Ht 69.0 in | Wt 214.0 lb

## 2014-09-08 DIAGNOSIS — E785 Hyperlipidemia, unspecified: Secondary | ICD-10-CM

## 2014-09-08 DIAGNOSIS — F419 Anxiety disorder, unspecified: Secondary | ICD-10-CM

## 2014-09-08 DIAGNOSIS — Z86010 Personal history of colonic polyps: Secondary | ICD-10-CM

## 2014-09-08 DIAGNOSIS — I10 Essential (primary) hypertension: Secondary | ICD-10-CM

## 2014-09-08 DIAGNOSIS — E669 Obesity, unspecified: Secondary | ICD-10-CM

## 2014-09-08 DIAGNOSIS — Z0000 Encounter for general adult medical examination without abnormal findings: Secondary | ICD-10-CM

## 2014-09-08 MED ORDER — SIMVASTATIN 10 MG PO TABS: 10.0000 mg | ORAL_TABLET | Freq: Every day | ORAL | Status: DC

## 2014-09-08 MED ORDER — HYDROCHLOROTHIAZIDE 25 MG PO TABS: 25.0000 mg | ORAL_TABLET | Freq: Every day | ORAL | Status: DC

## 2014-09-08 NOTE — Progress Notes (Addendum)
Patient ID: [name] MRN: [id number], DOB: Jun 29, 1951, 63 y.o. Date of Encounter: 09/08/2014,   Chief Complaint: Complete physical exam  HPI: 63 y.o. y/o white female here for CPE.  She had a CPE  With me 08/31/2013.  Had ROV 02/2014.   Hypertension: She is still taking HCTZ daily. No adverse effects. No lightheadedness.  Hyperlipidemia: In the past we gave her a trial for diet and exercise but she did not make changes and reported that she knew she would not be compliant with making changes. She is now on simvastatin 10 mg. No myalgias or other adverse effects.  Anxiety: At her  office visit in May 2014 she was on Lexapro and said this was working well and her anxiety was well controlled with this.   However, at [place] 08/2013 she reported that several months prior, when she had knee surgery -- she forgot to take the Lexapro. She stated that after that she just stayed off of it. However at  [place] 08/2013  she felt that she needed to get back on the medication.Off of medication,  she was often feeling anxious and overwhelmed. In the past she was using Klonopin at night to help with insomnia. However, she is no longer having any problems with sleeping and has not been taking the medication at all. Lexapro was restarted at that [place] 08/2013.   Today she says that and she restarted the Lexapro she "didn't feel good "so she quit it. Says that she is feeling okay off of medication. Says that she does not want to try different SSRI right now. Thinks that since she is not working now and circumstances are different and she has learned how to follow her anxiety that she thinks that she can keep this controlled without medication.  Today--08/2014--- she also says that she was reading about possible side effects from taking Prilosec for a long time so she got concerned about that and stopped that medicine. Is that she does feel GERD symptoms now that she is off of the medication but she just is using  Pepcid when necessary.   She had to quit her job in March. Says that she had surgery for torn meniscus. 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 Had significant knee pain. Discussed with her husband--"just couldn't do it any more"--quit working.   No other complaints or concerns today.  Review of Systems: All other pertinent ROS negative.   Past Medical History  Diagnosis Date  . Microscopic hematuria   . GERD (gastroesophageal reflux disease)   . Hyperlipidemia   . Anxiety   . Obesity   . Heart murmur   . Arthritis     knees   . Hypertension      Past Surgical History  Procedure Laterality Date  . Tonsillectomy    . Tubal ligation    . Knee arthroscopy Left 04/22/2013    Procedure: LEFT KNEE ARTHROSCOPY WITH medial meniscusectomy and chondroplasty;  Surgeon: Gearlean Alf, MD;  Location: [facility];  Service: Orthopedics;  Laterality: Left;    Home Meds:  Current Outpatient Prescriptions on File Prior to Visit  Medication Sig Dispense Refill  . aspirin 81 MG tablet Take 81 mg by mouth at bedtime.     . calcium carbonate (OS-CAL) 600 MG TABS Take 600 mg by mouth daily.    . cholecalciferol (  VITAMIN D) 1000 UNITS tablet Take 1,000 Units by mouth daily.     . fish oil-omega-3 fatty acids 1000 MG capsule Take 2 g by mouth daily.     . hydrochlorothiazide (HYDRODIURIL) 25 MG tablet Take 1 tablet by mouth  every morning 90 tablet 0  . ibuprofen (ADVIL,MOTRIN) 200 MG tablet Take 600 mg by mouth every 6 (six) hours as needed. For pain    . Multiple Vitamin (MULTIVITAMIN) tablet Take 1 tablet by mouth daily.      . simvastatin (ZOCOR) 10 MG tablet Take 1 tablet (10 mg total) by mouth daily at 6 PM. 90 tablet 0  . escitalopram (LEXAPRO) 10 MG tablet Take 1 tablet (10 mg total) by mouth daily. (Patient not taking: Reported on 09/08/2014) 90 tablet 1  . omeprazole (PRILOSEC) 20 MG capsule Take  20 mg by mouth daily.       No current facility-administered medications on file prior to visit.    Allergies: No Known Allergies  History   Social History  . Marital Status: Married    Spouse Name: N/A    Number of Children: N/A  . Years of Education: N/A   Occupational History  . Not on file.   Social History Main Topics  . Smoking status: Never Smoker   . Smokeless tobacco: Never Used  . Alcohol Use: Yes     Comment: rare  . Drug Use: No  . Sexual Activity: Yes    Birth Control/ Protection: Surgical   Other Topics Concern  . Not on file   Social History Narrative   Worked in [employer].   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       No other exercise.   Married.       Never smoked.    Family History  Problem Relation Age of Onset  . Heart disease Mother 46    MI at 76 and 24  . Cancer Father 4    Prostate Cancer    Physical Exam: Blood pressure 124/80, pulse 60, temperature 98 F (36.7 C), temperature source Oral, resp. rate 18, height 5\' 9"  (1.753 m), weight 214 lb (97.07 kg)., Body mass index is 31.59 kg/(m^2). General: Obese WF. Appears  in no acute distress. Neck: Supple. Trachea midline. No thyromegaly. Full ROM. No lymphadenopathy.No Carotid Bruits. Lungs: Clear to auscultation bilaterally without wheezes, rales, or rhonchi. Breathing is of normal effort and unlabored. Cardiovascular: RRR with S1 S2. No murmurs, rubs, or gallops. Distal pulses 2+ symmetrically. No carotid or abdominal bruits. Breast exam: Symmetrical bilaterally. No skin changes. No nipple discharge. No masses with palpation. Abdomen: Soft, non-tender, non-distended with normoactive bowel sounds. No hepatosplenomegaly or masses. No rebound/guarding. No CVA tenderness. No hernias. Pelvic exam: External genitalia normal. Vaginal mucosa normal. Cervix normal. Manual exam normal with no adnexal mass or abnormality.    Musculoskeletal: Strength and tone normal for her age.  Skin: Warm and moist without erythema, ecchymosis, wounds, or rash. Neuro: A+Ox3. CN II-XII grossly intact. Moves all extremities spontaneously. Full sensation throughout. Normal gait. Psych:  Responds to questions appropriately with a normal affect.   Assessment/Plan:  63 y.o. y/o female here for CPE   Preventive Care-- CPE 08/2014. The following was updated at that time: A. Screening Labs: She had a full panel of screening labs 09/02/14 including  CBC, CMET, TSH. All of these were normal.  She had a vitamin D level done  08/2013 which was normal. Lipid panel was excellent with  LDL 84.  B. Pap: Last smear was performed 04/18/2011. Normal. Cytology was normal and HPV was also negative.   It has been 3 years so we'll go ahead and do repeat Pap smear now 08/2014  C. Screening Mammogram: Patient states that she did have followup mammogram for the year 2014. ADDENDUM ADDED 04/04/2015: Received Mammogram Report---Performed at [facility] 04/01/2015---Negative.   D. DEXA/BMD: Will wait a couple more years to start obtaining these. She is on Os-Cal and vitamin D. Vitamin D level is within normal limits on current dose.  E. Colorectal Cancer Screening:  She had colonoscopy performed 10/09/2007. Positive diverticulosis. No polyps. Reports stated to repeat 7 years. WILL BE DUE 2016---PT AWARE  F. Immunizations:  Influenza:   Given here 07/15/2014 Tetanus: Given here August 2012 Pneumococcal: Will discuss at age 65 Zostavax:  Insurance covered 100%--received this in January 2015.  1. Hyperlipidemia -On Simvastatin 10mg .   2. Hypertension Blood pressure at goal. Continue current medication. BMET normal - hydrochlorothiazide (HYDRODIURIL) 25 MG tablet; Take 1 tablet (25 mg total) by mouth every morning.  Dispense: 90 tablet; Refill: 1  3. Anxiety Stable off of Lexapro. Discussed trying another SSRI but she says that she feels her symptoms are  stable off of medication. She is to follow-up with [place] if she feels that she does need new medication.  4. Obesity She is aware of need for diet and exercise. However when we recommended this for her cholesterol she was noncompliant.  5. COLONIC POLYPS, HX OF---See Note above under preventive care section.  6.H/O Microscopic hematuria--history of evaluation by urology.      She will need followup in 6 months for fasting labs and office visit. Followup sooner if needed.  Marin Olp [place], [place], [facility] 09/08/2014 9:26 AM

## 2014-09-09 LAB — PAP, THIN PREP W/HPV RFLX HPV TYPE 16/18: HPV DNA HIGH RISK: NOT DETECTED

## 2014-09-13 ENCOUNTER — Encounter: Payer: Self-pay | Admitting: Family Medicine

## 2014-09-17 HISTORY — PX: COLONOSCOPY: SHX174

## 2014-10-19 ENCOUNTER — Encounter: Payer: Self-pay | Admitting: Internal Medicine

## 2014-10-22 ENCOUNTER — Encounter: Payer: Self-pay | Admitting: Internal Medicine

## 2014-10-26 ENCOUNTER — Ambulatory Visit (AMBULATORY_SURGERY_CENTER): Payer: Self-pay | Admitting: *Deleted

## 2014-10-26 VITALS — Ht 69.0 in | Wt 214.8 lb

## 2014-10-26 DIAGNOSIS — Z86010 Personal history of colonic polyps: Secondary | ICD-10-CM

## 2014-10-26 MED ORDER — MOVIPREP 100 G PO SOLR: ORAL | Status: DC

## 2014-10-26 NOTE — Progress Notes (Signed)
No egg or soy allergy  No anesthesia or intubation problems per pt  No diet medications taken  Registered in EMMI   

## 2014-11-09 ENCOUNTER — Ambulatory Visit (AMBULATORY_SURGERY_CENTER): Payer: 59 | Admitting: Internal Medicine

## 2014-11-09 ENCOUNTER — Encounter: Payer: Self-pay | Admitting: Internal Medicine

## 2014-11-09 VITALS — BP 133/91 | HR 61 | Temp 96.0°F | Resp 16 | Ht 69.0 in | Wt 214.0 lb

## 2014-11-09 DIAGNOSIS — D125 Benign neoplasm of sigmoid colon: Secondary | ICD-10-CM

## 2014-11-09 DIAGNOSIS — D120 Benign neoplasm of cecum: Secondary | ICD-10-CM

## 2014-11-09 DIAGNOSIS — Z86010 Personal history of colonic polyps: Secondary | ICD-10-CM

## 2014-11-09 MED ORDER — SODIUM CHLORIDE 0.9 % IV SOLN: 500.0000 mL | INTRAVENOUS | Status: DC

## 2014-11-09 NOTE — Progress Notes (Signed)
Called to room to assist during endoscopic procedure.  Patient ID and intended procedure confirmed with present staff. Received instructions for my participation in the procedure from the performing physician.  

## 2014-11-09 NOTE — Patient Instructions (Signed)
YOU HAD AN ENDOSCOPIC PROCEDURE TODAY AT THE [facility]: Refer to the procedure report that was given to you for any specific questions about what was found during the examination.  If the procedure report does not answer your questions, please call your gastroenterologist to clarify.  If you requested that your care partner not be given the details of your procedure findings, then the procedure report has been included in a sealed envelope for you to review at your convenience later.  YOU SHOULD EXPECT: Some feelings of bloating in the abdomen. Passage of more gas than usual.  Walking can help get rid of the air that was put into your GI tract during the procedure and reduce the bloating. If you had a lower endoscopy (such as a colonoscopy or flexible sigmoidoscopy) you may notice spotting of blood in your stool or on the toilet paper. If you underwent a bowel prep for your procedure, then you may not have a normal bowel movement for a few days.  DIET: Your first meal following the procedure should be a light meal and then it is ok to progress to your normal diet.  A half-sandwich or bowl of soup is an example of a good first meal.  Heavy or fried foods are harder to digest and may make you feel nauseous or bloated.  Likewise meals heavy in dairy and vegetables can cause extra gas to form and this can also increase the bloating.  Drink plenty of fluids but you should avoid alcoholic beverages for 24 hours.  ACTIVITY: Your care partner should take you home directly after the procedure.  You should plan to take it easy, moving slowly for the rest of the day.  You can resume normal activity the day after the procedure however you should NOT DRIVE or use heavy machinery for 24 hours (because of the sedation medicines used during the test).    SYMPTOMS TO REPORT IMMEDIATELY: A gastroenterologist can be reached at any hour.  During normal business hours, 8:30 AM to 5:00 PM Monday through Friday,  call [phone].  After hours and on weekends, please call the GI answering service at [phone] who will take a message and have the physician on call contact you.   Following lower endoscopy (colonoscopy or flexible sigmoidoscopy):  Excessive amounts of blood in the stool  Significant tenderness or worsening of abdominal pains  Swelling of the abdomen that is new, acute  Fever of 100F or higher  FOLLOW UP: If any biopsies were taken you will be contacted by phone or by letter within the next 1-3 weeks.  Call your gastroenterologist if you have not heard about the biopsies in 3 weeks.  Our staff will call the home number listed on your records the next business day following your procedure to check on you and address any questions or concerns that you may have at that time regarding the information given to you following your procedure. This is a courtesy call and so if there is no answer at the home number and we have not heard from you through the emergency physician on call, we will assume that you have returned to your regular daily activities without incident.  SIGNATURES/CONFIDENTIALITY: You and/or your care partner have signed paperwork which will be entered into your electronic medical record.  These signatures attest to the fact that that the information above on your After Visit Summary has been reviewed and is understood.  Full responsibility of the confidentiality of this   discharge information lies with you and/or your care-partner.  You might note some irritation in your nose or some drainage.  This may cause feelings of congestion.  This is from the oxygen, which can be irritating.  There is no need for concern, this should clear up in a day or so.  Continue your normal medications  Please read over handouts about polyps, diverticulosis, hemorrhoids and high fiber diets

## 2014-11-09 NOTE — Progress Notes (Signed)
Stable to RR 

## 2014-11-09 NOTE — Op Note (Signed)
[place]  [address] [place], [id number]   COLONOSCOPY PROCEDURE REPORT  PATIENT: Andrea, Pierce  MR#: [id number] BIRTHDATE: 1951-02-26 , 65  yrs. old GENDER: female ENDOSCOPIST: Lafayette Dragon, MD REFERRED TK:[id number] Dennard Schaumann, M.D. PROCEDURE DATE:  11/09/2014 PROCEDURE:   Colonoscopy with cold biopsy polypectomy First Screening Colonoscopy - Avg.  risk and is 50 yrs.  old or older - No.  Prior Negative Screening - Now for repeat screening. N/A  History of Adenoma - Now for follow-up colonoscopy & has been > or = to 3 yrs.  Yes hx of adenoma.  Has been 3 or more years since last colonoscopy.  Polyps Removed Today? Yes. ASA CLASS:   Class II INDICATIONS:adenomatous polyp removed in 2006.  No polyp on last colonoscopy in January 2009. MEDICATIONS: Monitored anesthesia care and Propofol 400 mg IV  DESCRIPTION OF PROCEDURE:   After the risks benefits and alternatives of the procedure were thoroughly explained, informed consent was obtained.  The digital rectal exam revealed no abnormalities of the rectum.   The LB PCF Q180 [id number]  endoscope was introduced through the anus and advanced to the cecum, which was identified by both the appendix and ileocecal valve. No adverse events experienced.   The quality of the prep was good, using MoviPrep  The instrument was then slowly withdrawn as the colon was fully examined.      COLON FINDINGS: Two firm sessile polyps measuring 3 mm in size were found at the cecum and in the sigmoid colon.  A polypectomy was performed with cold forceps.  The resection was complete, the polyp tissue was completely retrieved and sent to histology.   There was mild diverticulosis noted in the sigmoid colon.   Small internal Grade I hemorrhoids were found.  Retroflexed views revealed no abnormalities. The time to cecum=9 minutes 46 seconds.  Withdrawal time=6 minutes 57 seconds.  The scope was withdrawn and the procedure  completed. COMPLICATIONS: There were no immediate complications.  ENDOSCOPIC IMPRESSION: 1.   Two sessile polyps were found at the cecum and in the sigmoid colon; polypectomy was performed with cold forceps 2.   Mild diverticulosis was noted in the sigmoid colon 3.   Small internal Grade I hemorrhoids  RECOMMENDATIONS: 1.  Await biopsy results 2.  High fiber diet Recall colonoscopy pending path report  eSigned:  Lafayette Dragon, MD 11/09/2014 10:34 AM   cc:   PATIENT NAME:  [name], [name] MR#: [id number]

## 2014-11-10 ENCOUNTER — Telehealth: Payer: Self-pay

## 2014-11-10 NOTE — Telephone Encounter (Signed)
  Follow up Call-  Call back number 11/09/2014  Post procedure Call Back phone  # [phone]  Permission to leave phone message Yes     Patient questions:  Do you have a fever, pain , or abdominal swelling? No. Pain Score  0 *  Have you tolerated food without any problems? Yes.    Have you been able to return to your normal activities? Yes.    Do you have any questions about your discharge instructions: Diet   No. Medications  No. Follow up visit  No.  Do you have questions or concerns about your Care? No.  Actions: * If pain score is 4 or above: No action needed, pain <4.

## 2014-11-16 ENCOUNTER — Encounter: Payer: Self-pay | Admitting: Internal Medicine

## 2015-01-19 ENCOUNTER — Encounter: Payer: Self-pay | Admitting: Physician Assistant

## 2015-01-19 ENCOUNTER — Ambulatory Visit (INDEPENDENT_AMBULATORY_CARE_PROVIDER_SITE_OTHER): Payer: 59 | Admitting: Physician Assistant

## 2015-01-19 VITALS — BP 126/80 | HR 76 | Temp 98.3°F | Resp 18 | Wt 213.0 lb

## 2015-01-19 DIAGNOSIS — L237 Allergic contact dermatitis due to plants, except food: Secondary | ICD-10-CM | POA: Diagnosis not present

## 2015-01-19 MED ORDER — METHYLPREDNISOLONE ACETATE 80 MG/ML IJ SUSP
80.0000 mg | Freq: Once | INTRAMUSCULAR | Status: AC
  Administered 2015-01-19: 80 mg via INTRAMUSCULAR

## 2015-01-19 MED ORDER — PREDNISONE 20 MG PO TABS: ORAL_TABLET | ORAL | Status: DC

## 2015-01-19 NOTE — Progress Notes (Signed)
Patient ID: [name] MRN: [id number], DOB: Mar 13, 1951, 64 y.o. Date of Encounter: 01/19/2015, 3:58 PM    Chief Complaint:  Chief Complaint  Patient presents with  . poison ivy    hands,arms,ears, face,feet     HPI: 64 y.o. year old white female is that she was walking around her house on Friday which was 5 days ago. Says that she knew that she was in some poison ivy but just had to get the work done. She was doing pressure washing on the house and trimming some shrubs. Started developing rash the following day. Now with vesicles on her fingers and hands and even some vesicles on the wrist and forearms. Also with rash up both arms and a patch on her left cheek and areas on her feet and lower legs bilaterally.  Says that she has been bathing in solution that is supposed to help with this. Also applying calamine lotion. Also has used Apple cider vinegar. Also taking Benadryl. Even with all of these medications, they are helping very little and she is continuing to develop new areas of itchy rash.     Home Meds:   Outpatient Prescriptions Prior to Visit  Medication Sig Dispense Refill  . aspirin 81 MG tablet Take 81 mg by mouth at bedtime.     . calcium carbonate (OS-CAL) 600 MG TABS Take 600 mg by mouth daily.    . cholecalciferol (VITAMIN D) 1000 UNITS tablet Take 1,000 Units by mouth daily.     . famotidine (PEPCID AC) 10 MG chewable tablet Chew 10 mg by mouth as needed for heartburn.    . hydrochlorothiazide (HYDRODIURIL) 25 MG tablet Take 1 tablet (25 mg total) by mouth daily. 90 tablet 3  . ibuprofen (ADVIL,MOTRIN) 200 MG tablet Take 600 mg by mouth every 6 (six) hours as needed. For pain    . Multiple Vitamin (MULTIVITAMIN) tablet Take 1 tablet by mouth daily.      . simvastatin (ZOCOR) 10 MG tablet Take 1 tablet (10 mg total) by mouth daily at 6 PM. 90 tablet 3  . fish oil-omega-3 fatty acids 1000 MG capsule Take 2 g by mouth daily.      No facility-administered  medications prior to visit.    Allergies: No Known Allergies    Review of Systems: See HPI for pertinent ROS. All other ROS negative.    Physical Exam: Blood pressure 126/80, pulse 76, temperature 98.3 F (36.8 C), temperature source Oral, resp. rate 18, weight 213 lb (96.616 kg)., Body mass index is 31.44 kg/(m^2). General:  Obese WF. Appears in no acute distress. Neck: Supple. No thyromegaly. No lymphadenopathy. Lungs: Clear bilaterally to auscultation without wheezes, rales, or rhonchi. Breathing is unlabored. Heart: Regular rhythm. No murmurs, rubs, or gallops. Msk:  Strength and tone normal for age. Skin: Vesicles are between her fingers and on her hands and even some on her wrists and forearms. Pink papular rash on her upper arms bilaterally. Area of urticarial type rash on the left cheek. Pink vessiculopapular lesions on bilateral lower legs. Neuro: Alert and oriented X 3. Moves all extremities spontaneously. Gait is normal. CNII-XII grossly in tact. Psych:  Responds to questions appropriately with a normal affect.     ASSESSMENT AND PLAN:  64 y.o. year old female with  1. Allergic dermatitis due to poison ivy Will give Depo-Medrol 80 mg IM now. She will then start oral prednisone taper tomorrow and take as directed. Cautioned her of possible adverse effects  with prednisone. Follow-up if rash does not resolve with this treatment. Also can continue taking Benadryl oral as directed on the package. - predniSONE (DELTASONE) 20 MG tablet; Take 3 daily for 2 days, then 2 daily for 2 days, then 1 daily for 2 days.  Dispense: 12 tablet; Refill: 0   Signed, [id number] [place], [place], [facility] 01/19/2015 3:58 PM

## 2015-01-19 NOTE — Addendum Note (Signed)
Addended by: Olena Mater on: 01/19/2015 04:19 PM   Modules accepted: Orders

## 2015-04-01 LAB — HM MAMMOGRAPHY: HM MAMMO: NORMAL

## 2015-04-26 ENCOUNTER — Encounter: Payer: Self-pay | Admitting: Family Medicine

## 2015-07-21 ENCOUNTER — Ambulatory Visit (INDEPENDENT_AMBULATORY_CARE_PROVIDER_SITE_OTHER): Payer: 59 | Admitting: *Deleted

## 2015-07-21 DIAGNOSIS — Z23 Encounter for immunization: Secondary | ICD-10-CM

## 2015-08-26 ENCOUNTER — Other Ambulatory Visit: Payer: Self-pay | Admitting: Physician Assistant

## 2015-08-26 NOTE — Telephone Encounter (Signed)
Medication refilled per protocol. 

## 2015-09-13 ENCOUNTER — Other Ambulatory Visit: Payer: 59

## 2015-09-13 ENCOUNTER — Other Ambulatory Visit: Payer: Self-pay | Admitting: Family Medicine

## 2015-09-13 DIAGNOSIS — Z79899 Other long term (current) drug therapy: Secondary | ICD-10-CM

## 2015-09-13 DIAGNOSIS — F419 Anxiety disorder, unspecified: Secondary | ICD-10-CM

## 2015-09-13 DIAGNOSIS — E669 Obesity, unspecified: Secondary | ICD-10-CM

## 2015-09-13 DIAGNOSIS — E785 Hyperlipidemia, unspecified: Secondary | ICD-10-CM

## 2015-09-13 DIAGNOSIS — I10 Essential (primary) hypertension: Secondary | ICD-10-CM

## 2015-09-13 DIAGNOSIS — Z0000 Encounter for general adult medical examination without abnormal findings: Secondary | ICD-10-CM

## 2015-09-13 LAB — LIPID PANEL
Cholesterol: 158 mg/dL (ref 125–200)
HDL: 62 mg/dL (ref >=46)
LDL CALC: 80 mg/dL (ref <130)
Total CHOL/HDL Ratio: 2.5 Ratio (ref <=5.0)
Triglycerides: 81 mg/dL (ref <150)
VLDL: 16 mg/dL (ref <30)

## 2015-09-13 LAB — COMPLETE METABOLIC PANEL WITH GFR
ALK PHOS: 43 U/L (ref 33–130)
ALT: 18 U/L (ref 6–29)
AST: 21 U/L (ref 10–35)
Albumin: 3.7 g/dL (ref 3.6–5.1)
BUN: 14 mg/dL (ref 7–25)
CHLORIDE: 103 mmol/L (ref 98–110)
CO2: 27 mmol/L (ref 20–31)
CREATININE: 0.73 mg/dL (ref 0.50–0.99)
Calcium: 9.1 mg/dL (ref 8.6–10.4)
GFR, EST NON AFRICAN AMERICAN: 89 mL/min (ref >=60)
GLUCOSE: 88 mg/dL (ref 70–99)
POTASSIUM: 3.6 mmol/L (ref 3.5–5.3)
Sodium: 141 mmol/L (ref 135–146)
Total Bilirubin: 0.4 mg/dL (ref 0.2–1.2)
Total Protein: 6 g/dL — ABNORMAL LOW (ref 6.1–8.1)

## 2015-09-13 LAB — CBC WITH DIFFERENTIAL/PLATELET
Basophils Absolute: 0 10*3/uL (ref 0.0–0.1)
Basophils Relative: 0 % (ref 0–1)
EOS ABS: 0.2 10*3/uL (ref 0.0–0.7)
Eosinophils Relative: 4 % (ref 0–5)
HCT: 42 % (ref 36.0–46.0)
HEMOGLOBIN: 13.7 g/dL (ref 12.0–15.0)
LYMPHS ABS: 2.1 10*3/uL (ref 0.7–4.0)
Lymphocytes Relative: 36 % (ref 12–46)
MCH: 28.1 pg (ref 26.0–34.0)
MCHC: 32.6 g/dL (ref 30.0–36.0)
MCV: 86.2 fL (ref 78.0–100.0)
MONOS PCT: 12 % (ref 3–12)
MPV: 9.6 fL (ref 8.6–12.4)
Monocytes Absolute: 0.7 10*3/uL (ref 0.1–1.0)
NEUTROS ABS: 2.8 10*3/uL (ref 1.7–7.7)
NEUTROS PCT: 48 % (ref 43–77)
PLATELETS: 229 10*3/uL (ref 150–400)
RBC: 4.87 MIL/uL (ref 3.87–5.11)
RDW: 14.4 % (ref 11.5–15.5)
WBC: 5.9 10*3/uL (ref 4.0–10.5)

## 2015-09-13 LAB — TSH: TSH: 3.072 u[IU]/mL (ref 0.350–4.500)

## 2015-09-15 ENCOUNTER — Ambulatory Visit (INDEPENDENT_AMBULATORY_CARE_PROVIDER_SITE_OTHER): Payer: 59 | Admitting: Physician Assistant

## 2015-09-15 ENCOUNTER — Encounter: Payer: Self-pay | Admitting: Physician Assistant

## 2015-09-15 VITALS — BP 124/78 | HR 72 | Temp 98.4°F | Resp 18 | Ht 68.0 in | Wt 214.0 lb

## 2015-09-15 DIAGNOSIS — Z86010 Personal history of colonic polyps: Secondary | ICD-10-CM | POA: Diagnosis not present

## 2015-09-15 DIAGNOSIS — F419 Anxiety disorder, unspecified: Secondary | ICD-10-CM | POA: Diagnosis not present

## 2015-09-15 DIAGNOSIS — E669 Obesity, unspecified: Secondary | ICD-10-CM

## 2015-09-15 DIAGNOSIS — I10 Essential (primary) hypertension: Secondary | ICD-10-CM

## 2015-09-15 DIAGNOSIS — E785 Hyperlipidemia, unspecified: Secondary | ICD-10-CM | POA: Diagnosis not present

## 2015-09-15 DIAGNOSIS — Z0000 Encounter for general adult medical examination without abnormal findings: Secondary | ICD-10-CM

## 2015-09-15 NOTE — Progress Notes (Signed)
Patient ID: [name] MRN: [id number], DOB: Oct 04, 1950, 64 y.o. Date of Encounter: 09/15/2015,   Chief Complaint: Complete physical exam  HPI: 64 y.o. y/o white female here for CPE.    Hypertension: She is still taking HCTZ daily. No adverse effects. No lightheadedness.  Hyperlipidemia: In the past we gave her a trial for diet and exercise but she did not make changes and reported that she knew she would not be compliant with making changes. She is now on simvastatin 10 mg. No myalgias or other adverse effects.  Anxiety: At her  office visit in May 2014 she was on Lexapro and said this was working well and her anxiety was well controlled with this.   However, at [place] 08/2013 she reported that several months prior, when she had knee surgery -- she forgot to take the Lexapro. She stated that after that she just stayed off of it. However at  [place] 08/2013  she felt that she needed to get back on the medication.Off of medication,  she was often feeling anxious and overwhelmed. In the past she was using Klonopin at night to help with insomnia. However, she is no longer having any problems with sleeping and has not been taking the medication at all. Lexapro was restarted at that [place] 08/2013.  08/2014-- she says that and she restarted the Lexapro she "didn't feel good "so she quit it. Says that she is feeling okay off of medication. Says that she does not want to try different SSRI right now. Thinks that since she is not working now and circumstances are different and she has learned how to follow her anxiety that she thinks that she can keep this controlled without medication. 08/2015--  she says that she has continued to do fine off of Lexapro. Says that if she starts to feel anxiety, she "prays to God and her anxiety resolves."  08/2014--- she also says that she was reading about possible side effects from taking Prilosec for a long time so she got concerned about that and stopped that  medicine. Is that she does feel GERD symptoms now that she is off of the medication but she just is using Pepcid when necessary. 08/2015--he says that she went without taking Prilosec OTC for 10 months. However says that she had a lot of symptoms so she has gone back to using it but is only using it PRN. Says rather than taking it every single day she just takes it if she starts feeling symptoms and this controls the symptoms very well.   She had to quit her job in March 2015. Says that she had surgery for torn meniscus. 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 Had significant knee pain. Discussed with her husband--"just couldn't do it any more"--quit working.   08/2015: Says that she got a membership to [employer] in August 2016.  She was going 3 days a week--- except she has not gotten there for the last 5 weeks Was riding the bike and doing some weights.  No other complaints or concerns today.  Review of Systems: All other pertinent ROS negative.   Past Medical History  Diagnosis Date  . Microscopic hematuria   . GERD (gastroesophageal reflux disease)   . Hyperlipidemia   . Anxiety   . Obesity   . Heart murmur   . Arthritis  knees   . Hypertension      Past Surgical History  Procedure Laterality Date  . Tonsillectomy    . Tubal ligation    . Knee arthroscopy Left 04/22/2013    Procedure: LEFT KNEE ARTHROSCOPY WITH medial meniscusectomy and chondroplasty;  Surgeon: Gearlean Alf, MD;  Location: [facility];  Service: Orthopedics;  Laterality: Left;  . Colonoscopy      Home Meds:  Current Outpatient Prescriptions on File Prior to Visit  Medication Sig Dispense Refill  . aspirin 81 MG tablet Take 81 mg by mouth at bedtime.     . calcium carbonate (OS-CAL) 600 MG TABS Take 600 mg by mouth daily.    . cholecalciferol (VITAMIN D) 1000 UNITS tablet Take 1,000 Units by mouth daily.      . fish oil-omega-3 fatty acids 1000 MG capsule Take 2 g by mouth daily.     . hydrochlorothiazide (HYDRODIURIL) 25 MG tablet Take 1 tablet (25 mg total) by mouth daily. 90 tablet 3  . ibuprofen (ADVIL,MOTRIN) 200 MG tablet Take 600 mg by mouth every 6 (six) hours as needed. For pain    . Multiple Vitamin (MULTIVITAMIN) tablet Take 1 tablet by mouth daily.      . simvastatin (ZOCOR) 10 MG tablet TAKE 1 TABLET BY MOUTH  DAILY AT 6 PM. 90 tablet 0   No current facility-administered medications on file prior to visit.    Allergies: No Known Allergies  Social History   Social History  . Marital Status: Married    Spouse Name: N/A  . Number of Children: N/A  . Years of Education: N/A   Occupational History  . Not on file.   Social History Main Topics  . Smoking status: Former Research scientist (life sciences)  . Smokeless tobacco: Never Used  . Alcohol Use: Yes     Comment: rare  . Drug Use: No  . Sexual Activity: Yes    Birth Control/ Protection: Surgical   Other Topics Concern  . Not on file   Social History Narrative   Worked in [employer].   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       No other exercise.   Married.       Never smoked.    Family History  Problem Relation Age of Onset  . Heart disease Mother 70    MI at 52 and 43  . Cancer Father 17    Prostate Cancer  . Colon polyps Father   . Stomach cancer Father   . Esophageal cancer Neg Hx   . Rectal cancer Neg Hx     Physical Exam: Blood pressure 124/80, pulse 60, temperature 98 F (36.7 C), temperature source Oral, resp. rate 18, height 5\' 9"  (1.753 m), weight 214 lb (97.07 kg)., Body mass index is 32.55 kg/(m^2). General: Obese WF. Appears  in no acute distress. Neck: Supple. Trachea midline. No thyromegaly. Full ROM. No lymphadenopathy.No Carotid Bruits. Lungs: Clear to auscultation bilaterally without wheezes, rales, or rhonchi. Breathing is of normal effort and  unlabored. Cardiovascular: RRR with S1 S2. No murmurs, rubs, or gallops. Distal pulses 2+ symmetrically. No carotid or abdominal bruits. Breast exam: Symmetrical bilaterally. No skin changes. No nipple discharge. No masses with palpation. Abdomen: Soft, non-tender, non-distended with normoactive bowel sounds. No hepatosplenomegaly or masses. No rebound/guarding. No CVA tenderness. No hernias. Pelvic exam: External genitalia normal. Vaginal mucosa normal. Cervix normal. Bimanual exam normal with no adnexal  mass or abnormality.  Musculoskeletal: Strength and tone normal for her age.  Skin: Warm and moist without erythema, ecchymosis, wounds, or rash. Neuro: A+Ox3. CN II-XII grossly intact. Moves all extremities spontaneously. Full sensation throughout. Normal gait. Psych:  Responds to questions appropriately with a normal affect.   Assessment/Plan:  64 y.o. y/o female here for CPE   Preventive Care-- CPE 08/2014. The following was updated at that time: A. Screening Labs: She had a full panel of screening labs 09/13/15 including  CBC, CMET, TSH. All of these were normal.  She had a vitamin D level done 08/2013 which was normal. Lipid panel was excellent with  LDL 80.  B. Pap:  Last smear was performed 09/08/2014. Normal. Cytology was normal and HPV was also negative.     C. Screening Mammogram:  Received Mammogram Report---Performed at [facility] 04/01/2015---Negative.   D. DEXA/BMD: Will wait a couple more years to start obtaining these. She is on Os-Cal and vitamin D. Vitamin D level is within normal limits on current dose.  E. Colorectal Cancer Screening:   She had colonoscopy performed 10/09/2007. Positive diverticulosis. No polyps. Reports stated to repeat 7 years.  She had repeat colonoscopy 11/09/14 by Dr. Olevia Perches.    Patient reports that it did show polyps and she thinks she is supposed to repeat 5 years but not certain.  I reviewed the colonoscopy report but it just says that follow-up  will depend on pathology. I looked up the pathology. I do not see what it says about when to repeat colonoscopy.  F. Immunizations:  Influenza:   Given here 07/15/2014, given 07/21/2015 Tetanus: Given here August 2012 Pneumococcal: Will discuss at age 28 Zostavax:  Insurance covered 100%--received this in January 2015.  1. Hyperlipidemia -On Simvastatin 10mg . At goal. LFTs normal. Continue simvastatin 10 mg.   2. Hypertension Blood pressure at goal. Continue current medication. BMET normal - hydrochlorothiazide (HYDRODIURIL) 25 MG tablet; Take 1 tablet (25 mg total) by mouth every morning.  Dispense: 90 tablet; Refill: 1  3. Anxiety Stable off of Lexapro. Discussed trying another SSRI but she says that she feels her symptoms are stable off of medication. She is to follow-up with [place] if she feels that she does need new medication.  4. Obesity She is aware of need for diet and exercise. However when we recommended this for her cholesterol she was noncompliant.She did join gym 04/2015 and has been going to gym  5. COLONIC POLYPS, HX OF---See Note above under preventive care section.  6.H/O Microscopic hematuria--history of evaluation by urology.      She will need followup in 6 months for fasting labs and office visit. Followup sooner if needed.  [address] [place], [place], [facility] 09/15/2015 2:12 PM

## 2015-09-20 ENCOUNTER — Other Ambulatory Visit: Payer: Self-pay | Admitting: Physician Assistant

## 2015-09-20 NOTE — Telephone Encounter (Signed)
Medication refilled per protocol. 

## 2015-11-24 ENCOUNTER — Other Ambulatory Visit: Payer: Self-pay | Admitting: Physician Assistant

## 2015-11-24 NOTE — Telephone Encounter (Signed)
Medication refilled per protocol. 

## 2016-03-19 ENCOUNTER — Encounter: Payer: Self-pay | Admitting: Physician Assistant

## 2016-03-19 ENCOUNTER — Ambulatory Visit (INDEPENDENT_AMBULATORY_CARE_PROVIDER_SITE_OTHER): Payer: BLUE CROSS/BLUE SHIELD | Admitting: Physician Assistant

## 2016-03-19 VITALS — BP 120/78 | HR 60 | Temp 97.8°F | Resp 18 | Ht 68.0 in | Wt 215.0 lb

## 2016-03-19 DIAGNOSIS — E669 Obesity, unspecified: Secondary | ICD-10-CM

## 2016-03-19 DIAGNOSIS — E785 Hyperlipidemia, unspecified: Secondary | ICD-10-CM | POA: Diagnosis not present

## 2016-03-19 DIAGNOSIS — I10 Essential (primary) hypertension: Secondary | ICD-10-CM | POA: Diagnosis not present

## 2016-03-19 DIAGNOSIS — F419 Anxiety disorder, unspecified: Secondary | ICD-10-CM

## 2016-03-19 DIAGNOSIS — Z86010 Personal history of colonic polyps: Secondary | ICD-10-CM

## 2016-03-19 LAB — LIPID PANEL
CHOL/HDL RATIO: 2.3 ratio (ref <=5.0)
CHOLESTEROL: 143 mg/dL (ref 125–200)
HDL: 63 mg/dL (ref >=46)
LDL Cholesterol: 63 mg/dL (ref <130)
TRIGLYCERIDES: 84 mg/dL (ref <150)
VLDL: 17 mg/dL (ref <30)

## 2016-03-19 LAB — COMPLETE METABOLIC PANEL WITH GFR
ALBUMIN: 3.8 g/dL (ref 3.6–5.1)
ALK PHOS: 43 U/L (ref 33–130)
ALT: 15 U/L (ref 6–29)
AST: 16 U/L (ref 10–35)
BILIRUBIN TOTAL: 0.5 mg/dL (ref 0.2–1.2)
BUN: 11 mg/dL (ref 7–25)
CO2: 27 mmol/L (ref 20–31)
Calcium: 8.9 mg/dL (ref 8.6–10.4)
Chloride: 106 mmol/L (ref 98–110)
Creat: 0.63 mg/dL (ref 0.50–0.99)
Glucose, Bld: 92 mg/dL (ref 70–99)
POTASSIUM: 4 mmol/L (ref 3.5–5.3)
Sodium: 144 mmol/L (ref 135–146)
TOTAL PROTEIN: 6 g/dL — AB (ref 6.1–8.1)

## 2016-03-19 NOTE — Progress Notes (Signed)
Patient ID: [name] MRN: [id number], DOB: 06/08/1951, 65 y.o. Date of Encounter: 03/19/2016,   Chief Complaint: Complete physical exam  HPI: 65 y.o. y/o white female here for CPE.    Hypertension: She is still taking HCTZ daily. No adverse effects. No lightheadedness.  Hyperlipidemia: In the past we gave her a trial for diet and exercise but she did not make changes and reported that she knew she would not be compliant with making changes. She is now on simvastatin 10 mg. No myalgias or other adverse effects.  Anxiety: At her  office visit in May 2014 she was on Lexapro and said this was working well and her anxiety was well controlled with this.   However, at [place] 08/2013 she reported that several months prior, when she had knee surgery -- she forgot to take the Lexapro. She stated that after that she just stayed off of it. However at  [place] 08/2013  she felt that she needed to get back on the medication.Off of medication,  she was often feeling anxious and overwhelmed. In the past she was using Klonopin at night to help with insomnia. However, she is no longer having any problems with sleeping and has not been taking the medication at all. Lexapro was restarted at that [place] 08/2013.  08/2014-- she says that and she restarted the Lexapro she "didn't feel good "so she quit it. Says that she is feeling okay off of medication. Says that she does not want to try different SSRI right now. Thinks that since she is not working now and circumstances are different and she has learned how to follow her anxiety that she thinks that she can keep this controlled without medication. 08/2015--  she says that she has continued to do fine off of Lexapro. Says that if she starts to feel anxiety, she "prays to God and her anxiety resolves." 03/19/2016--says that she has continued to do fine off of Lexapro. Again says that she starts to feel anxiety "prays to God". 03/19/2016--- says that she has recently been  having trouble getting her brain to turn off at night so that she can sleep. 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 Says that 2 of them they have fixed and so old and they're on the third house now fixing it up to sell. She and this house along with 2 nephews. Says that they mostly just need to do painting and pressure washing. However says that her nephews do things carelessly and not the correct way so than she needs to refix that the correct way. Says that one night this past week she was unable to sleep at 4 AM got up and did some pedaling in the kitchen and finally at 6 AM was able to go to sleep. Says that she used Advil PM in the past and that worked well but friends of hers told her so that this could cause problems with memory since she quit taking it. At visit 03/19/16 I recommended that she take the Advil PM and call me if this gets to where it is not working for her sleep.  08/2014--- she also says that she was reading about possible side effects from taking Prilosec for a long time so she got concerned about that and stopped that medicine. Is that she does feel GERD symptoms now that she is off of the medication but she just is using Pepcid  when necessary. 08/2015--he says that she went without taking Prilosec OTC for 10 months. However says that she had a lot of symptoms so she has gone back to using it but is only using it PRN. Says rather than taking it every single day she just takes it if she starts feeling symptoms and this controls the symptoms very well. 03/19/2016-  Still is using the Prilosec OTC  just as needed.  She had to quit her job in March 2015. Says that she had surgery for torn meniscus. 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 Had significant knee pain. Discussed with her husband--"just couldn't do it any more"--quit  working.    No other complaints or concerns today.  Review of Systems: All other pertinent ROS negative.   Past Medical History  Diagnosis Date  . Microscopic hematuria   . GERD (gastroesophageal reflux disease)   . Hyperlipidemia   . Anxiety   . Obesity   . Heart murmur   . Arthritis     knees   . Hypertension      Past Surgical History  Procedure Laterality Date  . Tonsillectomy    . Tubal ligation    . Knee arthroscopy Left 04/22/2013    Procedure: LEFT KNEE ARTHROSCOPY WITH medial meniscusectomy and chondroplasty;  Surgeon: Gearlean Alf, MD;  Location: [facility];  Service: Orthopedics;  Laterality: Left;  . Colonoscopy      Home Meds:  Current Outpatient Prescriptions on File Prior to Visit  Medication Sig Dispense Refill  . aspirin 81 MG tablet Take 81 mg by mouth at bedtime.     . calcium carbonate (OS-CAL) 600 MG TABS Take 600 mg by mouth daily.    . cholecalciferol (VITAMIN D) 1000 UNITS tablet Take 1,000 Units by mouth daily.     . fish oil-omega-3 fatty acids 1000 MG capsule Take 2 g by mouth daily.     . hydrochlorothiazide (HYDRODIURIL) 25 MG tablet Take 1 tablet by mouth  daily 90 tablet 1  . ibuprofen (ADVIL,MOTRIN) 200 MG tablet Take 600 mg by mouth every 6 (six) hours as needed. For pain    . Multiple Vitamin (MULTIVITAMIN) tablet Take 1 tablet by mouth daily.      Marland Kitchen omeprazole (PRILOSEC OTC) 20 MG tablet Take 20 mg by mouth daily as needed.    . simvastatin (ZOCOR) 10 MG tablet TAKE 1 TABLET BY MOUTH  DAILY AT 6 PM. 90 tablet 1   No current facility-administered medications on file prior to visit.    Allergies: No Known Allergies  Social History   Social History  . Marital Status: Married    Spouse Name: N/A  . Number of Children: N/A  . Years of Education: N/A   Occupational History  . Not on file.   Social History Main Topics  . Smoking status: Former Research scientist (life sciences)  . Smokeless tobacco: Never Used  . Alcohol Use: Yes     Comment: rare  . Drug  Use: No  . Sexual Activity: Yes    Birth Control/ Protection: Surgical   Other Topics Concern  . Not on file   Social History Narrative   Worked in [employer].   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       No other exercise.   Married.  Never smoked.    Family History  Problem Relation Age of Onset  . Heart disease Mother 6    MI at 51 and 11  . Cancer Father 59    Prostate Cancer  . Colon polyps Father   . Stomach cancer Father   . Esophageal cancer Neg Hx   . Rectal cancer Neg Hx     Physical Exam: Blood pressure 124/80, pulse 60, temperature 98 F (36.7 C), temperature source Oral, resp. rate 18, height 5\' 9"  (1.753 m), weight 214 lb (97.07 kg)., Body mass index is 32.7 kg/(m^2). General: Obese WF. Appears  in no acute distress. Neck: Supple. Trachea midline. No thyromegaly. Full ROM. No lymphadenopathy.No Carotid Bruits. Lungs: Clear to auscultation bilaterally without wheezes, rales, or rhonchi. Breathing is of normal effort and unlabored. Cardiovascular: RRR with S1 S2. No murmurs, rubs, or gallops. Distal pulses 2+ symmetrically. No carotid or abdominal bruits. Breast exam: Symmetrical bilaterally. No skin changes. No nipple discharge. No masses with palpation. Abdomen: Soft, non-tender, non-distended with normoactive bowel sounds. No hepatosplenomegaly or masses. No rebound/guarding. No CVA tenderness. No hernias. Pelvic exam: External genitalia normal. Vaginal mucosa normal. Cervix normal. Bimanual exam normal with no adnexal mass or abnormality.  Musculoskeletal: Strength and tone normal for her age.  Skin: Warm and moist without erythema, ecchymosis, wounds, or rash. Neuro: A+Ox3. CN II-XII grossly intact. Moves all extremities spontaneously. Full sensation throughout. Normal gait. Psych:  Responds to questions appropriately with a normal affect.   Assessment/Plan:  65 y.o. y/o female here for    1. Hyperlipidemia -On Simvastatin 10mg . At goal. LFTs normal. Continue simvastatin 10 mg.   2. Hypertension Blood pressure at goal. Continue current medication. BMET normal - hydrochlorothiazide (HYDRODIURIL) 25 MG tablet; Take 1 tablet (25 mg total) by mouth every morning.  Dispense: 90 tablet; Refill: 1  3. Anxiety Stable off of Lexapro. Discussed trying another SSRI but she says that she feels her symptoms are stable off of medication. She is to follow-up with [place] if she feels that she does need new medication. Use Advil PM for insomnia. If it gets to where this is not working, could use Benzo for short term--- but as long as the Advil PM works for her insomnia, continue this.  4. Obesity She is aware of need for diet and exercise. However when we recommended this for her cholesterol she was noncompliant.She did join gym 04/2015 and has been going to gym  5. COLONIC POLYPS, HX OF---See Note above under preventive care section.  6.H/O Microscopic hematuria--history of evaluation by urology.     THE FOLLOWING IS COPIED FORM CPE 09/15/2015: A. Screening Labs: She had a full panel of screening labs 09/13/15 including  CBC, CMET, TSH. All of these were normal.  She had a vitamin D level done 08/2013 which was normal. Lipid panel was excellent with  LDL 80.  B. Pap:  Last smear was performed 09/08/2014. Normal. Cytology was normal and HPV was also negative.     C. Screening Mammogram:  Received Mammogram Report---Performed at [facility] 04/01/2015---Negative.   D. DEXA/BMD: Will wait a couple more years to start obtaining these. She is on Os-Cal and vitamin D. Vitamin D level is within normal limits on current dose.  E. Colorectal Cancer Screening:   She had colonoscopy performed 10/09/2007. Positive diverticulosis. No polyps. Reports stated to repeat 7 years.  She had repeat colonoscopy 11/09/14 by Dr. Olevia Perches.    Patient reports that it did show polyps and she thinks  she is supposed  to repeat 5 years but not certain.  I reviewed the colonoscopy report but it just says that follow-up will depend on pathology. I looked up the pathology. I do not see what it says about when to repeat colonoscopy.  F. Immunizations:  Influenza:   Given here 07/15/2014, given 07/21/2015 Tetanus: Given here August 2012 Pneumococcal: Will discuss at age 79 Zostavax:  Insurance covered 100%--received this in January 2015.      She will need followup in 6 months for fasting labs and office visit. Followup sooner if needed.  Signed, [address] [place], [place], [facility] 03/19/2016 10:54 AM

## 2016-03-26 LAB — HM MAMMOGRAPHY

## 2016-04-19 ENCOUNTER — Encounter: Payer: Self-pay | Admitting: Family Medicine

## 2016-05-14 ENCOUNTER — Other Ambulatory Visit: Payer: Self-pay | Admitting: Physician Assistant

## 2016-07-17 ENCOUNTER — Ambulatory Visit (INDEPENDENT_AMBULATORY_CARE_PROVIDER_SITE_OTHER): Payer: BLUE CROSS/BLUE SHIELD

## 2016-07-17 DIAGNOSIS — Z23 Encounter for immunization: Secondary | ICD-10-CM

## 2016-08-10 ENCOUNTER — Other Ambulatory Visit: Payer: Self-pay | Admitting: Family Medicine

## 2016-09-20 ENCOUNTER — Other Ambulatory Visit: Payer: BLUE CROSS/BLUE SHIELD

## 2016-09-20 DIAGNOSIS — E785 Hyperlipidemia, unspecified: Secondary | ICD-10-CM

## 2016-09-20 DIAGNOSIS — F419 Anxiety disorder, unspecified: Secondary | ICD-10-CM

## 2016-09-20 DIAGNOSIS — Z79899 Other long term (current) drug therapy: Secondary | ICD-10-CM

## 2016-09-20 DIAGNOSIS — Z0000 Encounter for general adult medical examination without abnormal findings: Secondary | ICD-10-CM

## 2016-09-20 DIAGNOSIS — I10 Essential (primary) hypertension: Secondary | ICD-10-CM

## 2016-09-20 LAB — COMPLETE METABOLIC PANEL WITH GFR
ALBUMIN: 4.1 g/dL (ref 3.6–5.1)
ALK PHOS: 47 U/L (ref 33–130)
ALT: 12 U/L (ref 6–29)
AST: 15 U/L (ref 10–35)
BUN: 17 mg/dL (ref 7–25)
CALCIUM: 9.5 mg/dL (ref 8.6–10.4)
CHLORIDE: 101 mmol/L (ref 98–110)
CO2: 25 mmol/L (ref 20–31)
CREATININE: 0.72 mg/dL (ref 0.50–0.99)
GFR, Est African American: >89 mL/min (ref >=60)
GFR, Est Non African American: 89 mL/min (ref >=60)
GLUCOSE: 88 mg/dL (ref 70–99)
Potassium: 3.7 mmol/L (ref 3.5–5.3)
SODIUM: 142 mmol/L (ref 135–146)
Total Bilirubin: 0.6 mg/dL (ref 0.2–1.2)
Total Protein: 6.4 g/dL (ref 6.1–8.1)

## 2016-09-20 LAB — CBC WITH DIFFERENTIAL/PLATELET
BASOS PCT: 0 %
Basophils Absolute: 0 cells/uL (ref 0–200)
EOS PCT: 4 %
Eosinophils Absolute: 224 cells/uL (ref 15–500)
HEMATOCRIT: 45.3 % — AB (ref 35.0–45.0)
Hemoglobin: 14.5 g/dL (ref 12.0–15.0)
LYMPHS PCT: 38 %
Lymphs Abs: 2128 cells/uL (ref 850–3900)
MCH: 28.3 pg (ref 27.0–33.0)
MCHC: 32 g/dL (ref 32.0–36.0)
MCV: 88.5 fL (ref 80.0–100.0)
MONO ABS: 616 {cells}/uL (ref 200–950)
MPV: 9.8 fL (ref 7.5–12.5)
Monocytes Relative: 11 %
NEUTROS PCT: 47 %
Neutro Abs: 2632 cells/uL (ref 1500–7800)
Platelets: 245 10*3/uL (ref 140–400)
RBC: 5.12 MIL/uL — AB (ref 3.80–5.10)
RDW: 13.9 % (ref 11.0–15.0)
WBC: 5.6 10*3/uL (ref 3.8–10.8)

## 2016-09-20 LAB — LIPID PANEL
CHOL/HDL RATIO: 3.3 ratio (ref <5.0)
Cholesterol: 188 mg/dL (ref <200)
HDL: 57 mg/dL (ref >50)
LDL CALC: 107 mg/dL — AB (ref <100)
Triglycerides: 120 mg/dL (ref <150)
VLDL: 24 mg/dL (ref <30)

## 2016-09-20 LAB — TSH: TSH: 2.96 m[IU]/L

## 2016-09-24 ENCOUNTER — Ambulatory Visit (INDEPENDENT_AMBULATORY_CARE_PROVIDER_SITE_OTHER): Payer: BLUE CROSS/BLUE SHIELD | Admitting: Physician Assistant

## 2016-09-24 ENCOUNTER — Encounter: Payer: Self-pay | Admitting: Physician Assistant

## 2016-09-24 VITALS — BP 140/90 | HR 58 | Temp 97.4°F | Resp 16 | Wt 219.0 lb

## 2016-09-24 DIAGNOSIS — E785 Hyperlipidemia, unspecified: Secondary | ICD-10-CM

## 2016-09-24 DIAGNOSIS — Z0000 Encounter for general adult medical examination without abnormal findings: Secondary | ICD-10-CM

## 2016-09-24 DIAGNOSIS — I10 Essential (primary) hypertension: Secondary | ICD-10-CM

## 2016-09-24 NOTE — Progress Notes (Signed)
Patient ID: [name] MRN: [id number], DOB: 18-Aug-1951, 66 y.o. Date of Encounter: 09/24/2016,   Chief Complaint: Complete physical exam  HPI: 66 y.o. y/o white female here for CPE.    Hypertension: She is still taking HCTZ daily. No adverse effects. No lightheadedness.  Hyperlipidemia: In the past we gave her a trial for diet and exercise but she did not make changes and reported that she knew she would not be compliant with making changes. She is now on simvastatin 10 mg. No myalgias or other adverse effects.  Anxiety: At her  office visit in May 2014 she was on Lexapro and said this was working well and her anxiety was well controlled with this.   However, at [place] 08/2013 she reported that several months prior, when she had knee surgery -- she forgot to take the Lexapro. She stated that after that she just stayed off of it. However at  [place] 08/2013  she felt that she needed to get back on the medication.Off of medication,  she was often feeling anxious and overwhelmed. In the past she was using Klonopin at night to help with insomnia. However, she is no longer having any problems with sleeping and has not been taking the medication at all. Lexapro was restarted at that [place] 08/2013.  08/2014-- she says that and she restarted the Lexapro she "didn't feel good "so she quit it. Says that she is feeling okay off of medication. Says that she does not want to try different SSRI right now. Thinks that since she is not working now and circumstances are different and she has learned how to follow her anxiety that she thinks that she can keep this controlled without medication. 08/2015--  she says that she has continued to do fine off of Lexapro. Says that if she starts to feel anxiety, she "prays to God and her anxiety resolves." 03/19/2016--says that she has continued to do fine off of Lexapro. Again says that she starts to feel anxiety "prays to God". 03/19/2016--- says that she has recently been  having trouble getting her brain to turn off at night so that she can sleep. 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 Says that 2 of them they have fixed and so old and they're on the third house now fixing it up to sell. She and this house along with 2 nephews. Says that they mostly just need to do painting and pressure washing. However says that her nephews do things carelessly and not the correct way so than she needs to refix that the correct way. Says that one night this past week she was unable to sleep at 4 AM got up and did some pedaling in the kitchen and finally at 6 AM was able to go to sleep. Says that she used Advil PM in the past and that worked well but friends of hers told her so that this could cause problems with memory since she quit taking it. At visit 03/19/16 I recommended that she take the Advil PM and call me if this gets to where it is not working for her sleep.  08/2014--- she also says that she was reading about possible side effects from taking Prilosec for a long time so she got concerned about that and stopped that medicine. Is that she does feel GERD symptoms now that she is off of the medication but she just is using Pepcid  when necessary. 08/2015--he says that she went without taking Prilosec OTC for 10 months. However says that she had a lot of symptoms so she has gone back to using it but is only using it PRN. Says rather than taking it every single day she just takes it if she starts feeling symptoms and this controls the symptoms very well. 03/19/2016-  Still is using the Prilosec OTC  just as needed.  She had to quit her job in March 2015. Says that she had surgery for torn meniscus. 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 Had significant knee pain. Discussed with her husband--"just couldn't do it any more"--quit  working.    09/24/2016:  She presents for complete physical exam today. She has no complaints or concerns. Hypertension:  she continues to take HCTZ daily. No lightheadedness. No adverse effects. Hyperlipidemia: she continues to take the Zocor as directed. No myalgias or other adverse effects. No other complaints or concerns today.  Review of Systems: All other pertinent ROS negative.   Past Medical History:  Diagnosis Date  . Anxiety   . Arthritis    knees   . GERD (gastroesophageal reflux disease)   . Heart murmur   . Hyperlipidemia   . Hypertension   . Microscopic hematuria   . Obesity      Past Surgical History:  Procedure Laterality Date  . COLONOSCOPY    . KNEE ARTHROSCOPY Left 04/22/2013   Procedure: LEFT KNEE ARTHROSCOPY WITH medial meniscusectomy and chondroplasty;  Surgeon: Gearlean Alf, MD;  Location: [facility];  Service: Orthopedics;  Laterality: Left;  . TONSILLECTOMY    . TUBAL LIGATION      Home Meds:  Current Outpatient Prescriptions on File Prior to Visit  Medication Sig Dispense Refill  . aspirin 81 MG tablet Take 81 mg by mouth at bedtime.     . calcium carbonate (OS-CAL) 600 MG TABS Take 600 mg by mouth daily.    . cholecalciferol (VITAMIN D) 1000 UNITS tablet Take 1,000 Units by mouth daily.     . fish oil-omega-3 fatty acids 1000 MG capsule Take 2 g by mouth daily.     . hydrochlorothiazide (HYDRODIURIL) 25 MG tablet TAKE 1 TABLET BY MOUTH EVERY DAY 90 tablet 0  . ibuprofen (ADVIL,MOTRIN) 200 MG tablet Take 600 mg by mouth every 6 (six) hours as needed. For pain    . Multiple Vitamin (MULTIVITAMIN) tablet Take 1 tablet by mouth daily.      . simvastatin (ZOCOR) 10 MG tablet TAKE 1 TABLET BY MOUTH EVERY DAY AT 6PM 90 tablet 0  . omeprazole (PRILOSEC OTC) 20 MG tablet Take 20 mg by mouth daily as needed.     No current facility-administered medications on file prior to visit.     Allergies: No Known Allergies  Social History   Social History  .  Marital status: Married    Spouse name: N/A  . Number of children: N/A  . Years of education: N/A   Occupational History  . Not on file.   Social History Main Topics  . Smoking status: Former Research scientist (life sciences)  . Smokeless tobacco: Never Used  . Alcohol use Yes     Comment: rare  . Drug use: No  . Sexual activity: Yes    Birth control/ protection: Surgical   Other Topics Concern  . Not on file  Social History Narrative   Worked in [employer].   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       No other exercise.   Married.       Never smoked.    Family History  Problem Relation Age of Onset  . Heart disease Mother 60    MI at 14 and 37  . Cancer Father 48    Prostate Cancer  . Colon polyps Father   . Stomach cancer Father   . Esophageal cancer Neg Hx   . Rectal cancer Neg Hx     Physical Exam: Blood pressure 124/80, pulse 60, temperature 98 F (36.7 C), temperature source Oral, resp. rate 18, height 5\' 9"  (1.753 m), weight 214 lb (97.07 kg)., Body mass index is 33.3 kg/m. General: Obese WF. Appears  in no acute distress. HEENT: Ears ear canals and tympanic membranes normal bilaterally. Oral mucosa normal. Pharynx normal with no erythema or exudate. Neck: Supple. Trachea midline. No thyromegaly. Full ROM. No lymphadenopathy.No Carotid Bruits. Lungs: Clear to auscultation bilaterally without wheezes, rales, or rhonchi. Breathing is of normal effort and unlabored. Cardiovascular: RRR with S1 S2. No murmurs, rubs, or gallops. Distal pulses 2+ symmetrically. No carotid or abdominal bruits. Breast exam: Symmetrical bilaterally. No skin changes. No nipple discharge. No masses with palpation. Abdomen: Soft, non-tender, non-distended with normoactive bowel sounds. No hepatosplenomegaly or masses. No rebound/guarding. No CVA tenderness. No hernias. Pelvic exam: External genitalia normal. Vaginal mucosa normal. Cervix normal.  Bimanual exam normal with no adnexal mass or abnormality.  Musculoskeletal: Strength and tone normal for her age.  Skin: Warm and moist without erythema, ecchymosis, wounds, or rash. Neuro: A+Ox3. CN II-XII grossly intact. Moves all extremities spontaneously. Full sensation throughout. Normal gait. Psych:  Responds to questions appropriately with a normal affect.   Assessment/Plan:  66 y.o. y/o female here for   1. Encounter for preventive health examination   A. Screening Labs: She had a full panel of screening labs 09/20/2016  CBC, CMET, TSH FLP. All of these were normal. Reviewed these with her today.  B. Pap:  Last smear was performed 09/08/2014. Normal. Cytology was normal and HPV was also negative.     C. Screening Mammogram:  Received Mammogram Report---Performed at [facility] 03/26/2016---Negative.   D. DEXA/BMD: Will wait a couple more years to start obtaining these. She is on Os-Cal and vitamin D. Vitamin D level is within normal limits on current dose.  E. Colorectal Cancer Screening:   She had colonoscopy performed 10/09/2007. Positive diverticulosis. No polyps. Reports stated to repeat 7 years.  She had repeat colonoscopy 11/09/14 by Dr. Olevia Perches.    Patient reports that it did show polyps and she thinks she is supposed to repeat 5 years but not certain.  I reviewed the colonoscopy report but it just says that follow-up will depend on pathology. I looked up the pathology. I do not see what it says about when to repeat colonoscopy.  F. Immunizations:  Influenza:   Given here 07/15/2014, given 07/21/2015, also received flu vaccine here for the 2017 flu season. Tetanus: Given here August 2012 Pneumococcal: Will discuss at age 89 Zostavax:  Insurance covered 100%--received this in January 2015.  Hyperlipidemia -On Simvastatin 10mg . At goal. LFTs normal. Continue simvastatin 10 mg.   Hypertension Blood pressure at goal. Continue current medication. BMET normal -  hydrochlorothiazide (HYDRODIURIL) 25 MG tablet; Take 1 tablet (25 mg total) by mouth  every morning.  Dispense: 90 tablet; Refill: 1  Anxiety Stable off of Lexapro. Discussed trying another SSRI but she says that she feels her symptoms are stable off of medication. She is to follow-up with [place] if she feels that she does need new medication. Use Advil PM for insomnia. If it gets to where this is not working, could use Benzo for short term--- but as long as the Advil PM works for her insomnia, continue this.  Obesity She is aware of need for diet and exercise. However when we recommended this for her cholesterol she was noncompliant.She did join gym 04/2015 and has been going to gym  COLONIC POLYPS, HX OF---See Note above under preventive care section.  H/O Microscopic hematuria--history of evaluation by urology  She will need followup in 6 months for fasting labs and office visit. Followup sooner if needed.  Signed, [address] [place], [place], [facility] 09/24/2016 10:50 AM

## 2016-11-08 ENCOUNTER — Other Ambulatory Visit: Payer: Self-pay | Admitting: Physician Assistant

## 2016-11-08 NOTE — Telephone Encounter (Signed)
Refill appropriate 

## 2017-03-25 ENCOUNTER — Encounter: Payer: Self-pay | Admitting: Physician Assistant

## 2017-03-25 ENCOUNTER — Ambulatory Visit (INDEPENDENT_AMBULATORY_CARE_PROVIDER_SITE_OTHER): Payer: BLUE CROSS/BLUE SHIELD | Admitting: Physician Assistant

## 2017-03-25 VITALS — BP 134/84 | HR 63 | Temp 98.0°F | Resp 16 | Ht 68.0 in | Wt 218.6 lb

## 2017-03-25 DIAGNOSIS — F419 Anxiety disorder, unspecified: Secondary | ICD-10-CM | POA: Diagnosis not present

## 2017-03-25 DIAGNOSIS — E785 Hyperlipidemia, unspecified: Secondary | ICD-10-CM

## 2017-03-25 DIAGNOSIS — I10 Essential (primary) hypertension: Secondary | ICD-10-CM | POA: Diagnosis not present

## 2017-03-25 LAB — COMPLETE METABOLIC PANEL WITH GFR
ALT: 15 U/L (ref 6–29)
AST: 15 U/L (ref 10–35)
Albumin: 4 g/dL (ref 3.6–5.1)
Alkaline Phosphatase: 48 U/L (ref 33–130)
BILIRUBIN TOTAL: 0.4 mg/dL (ref 0.2–1.2)
BUN: 14 mg/dL (ref 7–25)
CHLORIDE: 105 mmol/L (ref 98–110)
CO2: 26 mmol/L (ref 20–31)
Calcium: 9.1 mg/dL (ref 8.6–10.4)
Creat: 0.77 mg/dL (ref 0.50–0.99)
GFR, EST NON AFRICAN AMERICAN: 82 mL/min (ref >=60)
Glucose, Bld: 94 mg/dL (ref 70–99)
POTASSIUM: 3.8 mmol/L (ref 3.5–5.3)
SODIUM: 143 mmol/L (ref 135–146)
TOTAL PROTEIN: 6.3 g/dL (ref 6.1–8.1)

## 2017-03-25 LAB — LIPID PANEL
CHOL/HDL RATIO: 2.8 ratio (ref <5.0)
Cholesterol: 168 mg/dL (ref <200)
HDL: 60 mg/dL (ref >50)
LDL CALC: 88 mg/dL (ref <100)
TRIGLYCERIDES: 102 mg/dL (ref <150)
VLDL: 20 mg/dL (ref <30)

## 2017-03-25 NOTE — Progress Notes (Signed)
Patient ID: [name] MRN: [id number], DOB: 13-Aug-1951, 66 y.o. Date of Encounter: 03/25/2017,   Chief Complaint: Complete physical exam  HPI: 66 y.o. y/o white female here for CPE.    Hypertension: She is still taking HCTZ daily. No adverse effects. No lightheadedness.  Hyperlipidemia: In the past we gave her a trial for diet and exercise but she did not make changes and reported that she knew she would not be compliant with making changes. She is now on simvastatin 10 mg. No myalgias or other adverse effects.  Anxiety: At her  office visit in May 2014 she was on Lexapro and said this was working well and her anxiety was well controlled with this.   However, at [place] 08/2013 she reported that several months prior, when she had knee surgery -- she forgot to take the Lexapro. She stated that after that she just stayed off of it. However at  [place] 08/2013  she felt that she needed to get back on the medication.Off of medication,  she was often feeling anxious and overwhelmed. In the past she was using Klonopin at night to help with insomnia. However, she is no longer having any problems with sleeping and has not been taking the medication at all. Lexapro was restarted at that [place] 08/2013.  08/2014-- she says that and she restarted the Lexapro she "didn't feel good "so she quit it. Says that she is feeling okay off of medication. Says that she does not want to try different SSRI right now. Thinks that since she is not working now and circumstances are different and she has learned how to follow her anxiety that she thinks that she can keep this controlled without medication. 08/2015--  she says that she has continued to do fine off of Lexapro. Says that if she starts to feel anxiety, she "prays to God and her anxiety resolves." 03/19/2016--says that she has continued to do fine off of Lexapro. Again says that she starts to feel anxiety "prays to God". 03/19/2016--- says that she has recently been  having trouble getting her brain to turn off at night so that she can sleep. 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 Says that 2 of them they have fixed and so old and they're on the third house now fixing it up to sell. She and this house along with 2 nephews. Says that they mostly just need to do painting and pressure washing. However says that her nephews do things carelessly and not the correct way so than she needs to refix that the correct way. Says that one night this past week she was unable to sleep at 4 AM got up and did some pedaling in the kitchen and finally at 6 AM was able to go to sleep. Says that she used Advil PM in the past and that worked well but friends of hers told her so that this could cause problems with memory since she quit taking it. At visit 03/19/16 I recommended that she take the Advil PM and call me if this gets to where it is not working for her sleep.  08/2014--- she also says that she was reading about possible side effects from taking Prilosec for a long time so she got concerned about that and stopped that medicine. Is that she does feel GERD symptoms now that she is off of the medication but she just is using Pepcid  when necessary. 08/2015--he says that she went without taking Prilosec OTC for 10 months. However says that she had a lot of symptoms so she has gone back to using it but is only using it PRN. Says rather than taking it every single day she just takes it if she starts feeling symptoms and this controls the symptoms very well. 03/19/2016-  Still is using the Prilosec OTC  just as needed.  She had to quit her job in March 2015. Says that she had surgery for torn meniscus. 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 Had significant knee pain. Discussed with her husband--"just couldn't do it any more"--quit  working.    09/24/2016:  She presents for complete physical exam today. She has no complaints or concerns. Hypertension:  she continues to take HCTZ daily. No lightheadedness. No adverse effects. Hyperlipidemia: she continues to take the Zocor as directed. No myalgias or other adverse effects. No other complaints or concerns today.   03/25/2017: She presents for routine follow-up visit. She has no specific concerns to address today. Hypertension:  she continues to take HCTZ daily. No lightheadedness. No adverse effects. Hyperlipidemia: she continues to take the Zocor as directed. No myalgias or other adverse effects. Asked what she does to keep herself busy since she has retired. Sounds like she mostly just watches TV and doesn't really do much.      Review of Systems: All other pertinent ROS negative.   Past Medical History:  Diagnosis Date  . Anxiety   . Arthritis    knees   . GERD (gastroesophageal reflux disease)   . Heart murmur   . Hyperlipidemia   . Hypertension   . Microscopic hematuria   . Obesity      Past Surgical History:  Procedure Laterality Date  . COLONOSCOPY    . KNEE ARTHROSCOPY Left 04/22/2013   Procedure: LEFT KNEE ARTHROSCOPY WITH medial meniscusectomy and chondroplasty;  Surgeon: Gearlean Alf, MD;  Location: [facility];  Service: Orthopedics;  Laterality: Left;  . TONSILLECTOMY    . TUBAL LIGATION      Home Meds:  Current Outpatient Prescriptions on File Prior to Visit  Medication Sig Dispense Refill  . aspirin 81 MG tablet Take 81 mg by mouth at bedtime.     . calcium carbonate (OS-CAL) 600 MG TABS Take 600 mg by mouth daily.    . cholecalciferol (VITAMIN D) 1000 UNITS tablet Take 1,000 Units by mouth daily.     . fish oil-omega-3 fatty acids 1000 MG capsule Take 2 g by mouth daily.     . hydrochlorothiazide (HYDRODIURIL) 25 MG tablet TAKE 1 TABLET BY MOUTH EVERY DAY 90 tablet 1  . ibuprofen (ADVIL,MOTRIN) 200 MG tablet Take 600 mg by mouth every 6  (six) hours as needed. For pain    . Multiple Vitamin (MULTIVITAMIN) tablet Take 1 tablet by mouth daily.      . simvastatin (ZOCOR) 10 MG tablet TAKE 1 TABLET BY MOUTH EVERY DAY AT 6PM 90 tablet 1   No current facility-administered medications on file prior to visit.     Allergies: No Known Allergies  Social History   Social History  . Marital status: Married    Spouse name: N/A  . Number of children: N/A  . Years of education: N/A   Occupational History  . Not on file.   Social  History Main Topics  . Smoking status: Former Research scientist (life sciences)  . Smokeless tobacco: Never Used  . Alcohol use Yes     Comment: rare  . Drug use: No  . Sexual activity: Yes    Birth control/ protection: Surgical   Other Topics Concern  . Not on file   Social History Narrative   Worked in [employer].   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       No other exercise.   Married.       Never smoked.    Family History  Problem Relation Age of Onset  . Heart disease Mother 35       MI at 61 and 10  . Cancer Father 11       Prostate Cancer  . Colon polyps Father   . Stomach cancer Father   . Esophageal cancer Neg Hx   . Rectal cancer Neg Hx     Physical Exam: Blood pressure 124/80, pulse 60, temperature 98 F (36.7 C), temperature source Oral, resp. rate 18, height 5\' 9"  (1.753 m), weight 214 lb (97.07 kg)., Body mass index is 33.24 kg/m. General: Obese WF. Appears  in no acute distress. Neck: Supple. Trachea midline. No thyromegaly. Full ROM. No lymphadenopathy.No Carotid Bruits. Lungs: Clear to auscultation bilaterally without wheezes, rales, or rhonchi. Breathing is of normal effort and unlabored. Cardiovascular: RRR with S1 S2. No murmurs, rubs, or gallops. Distal pulses 2+ symmetrically. No carotid or abdominal bruits. Abdomen: Soft, non-tender, non-distended with normoactive bowel sounds. No hepatosplenomegaly or masses. No  rebound/guarding. No CVA tenderness. No hernias. Musculoskeletal: Strength and tone normal for her age.  Skin: Warm and moist without erythema, ecchymosis, wounds, or rash. Neuro: A+Ox3. CN II-XII grossly intact. Moves all extremities spontaneously. Full sensation throughout. Normal gait. Psych:  Responds to questions appropriately with a normal affect.   Assessment/Plan:  66 y.o. y/o female here for   Hyperlipidemia 03/25/2017: -On Simvastatin 10mg . check labs to monitor.   Hypertension 03/25/2017: Blood pressure at goal. Continue current medication. Check BMET to monitor - hydrochlorothiazide (HYDRODIURIL) 25 MG tablet; Take 1 tablet (25 mg total) by mouth every morning.  Dispense: 90 tablet; Refill: 1  Anxiety Stable off of Lexapro. Discussed trying another SSRI but she says that she feels her symptoms are stable off of medication. She is to follow-up with [place] if she feels that she does need new medication. Use Advil PM for insomnia. If it gets to where this is not working, could use Benzo for short term--- but as long as the Advil PM works for her insomnia, continue this.  Obesity She is aware of need for diet and exercise. However when we recommended this for her cholesterol she was noncompliant.She did join gym 04/2015 and has been going to gym  COLONIC POLYPS, HX OF---See Note above under preventive care section.  H/O Microscopic hematuria--history of evaluation by urology    -----------------------------------THE FOLLOWING IS COPIED FROM HER CPE NOTE 09/2016:---------------------------------------------------------------  Encounter for preventive health examination  A. Screening Labs: She had a full panel of screening labs 09/20/2016  CBC, CMET, TSH FLP. All of these were normal. Reviewed these with her today.  B. Pap:  Last smear was performed 09/08/2014. Normal. Cytology was normal and HPV was also negative.     C. Screening Mammogram:  Received Mammogram  Report---Performed at [facility] 03/26/2016---Negative.   D. DEXA/BMD: Will wait a couple more years to start  obtaining these. She is on Os-Cal and vitamin D. Vitamin D level is within normal limits on current dose.  E. Colorectal Cancer Screening:   She had colonoscopy performed 10/09/2007. Positive diverticulosis. No polyps. Reports stated to repeat 7 years.  She had repeat colonoscopy 11/09/14 by Dr. Olevia Perches.    Patient reports that it did show polyps and she thinks she is supposed to repeat 5 years but not certain.  I reviewed the colonoscopy report but it just says that follow-up will depend on pathology. I looked up the pathology. I do not see what it says about when to repeat colonoscopy.  F. Immunizations:  Influenza:   Given here 07/15/2014, given 07/21/2015, also received flu vaccine here for the 2017 flu season. Tetanus: Given here August 2012 Pneumococcal: Will discuss at age 54 Zostavax:  Insurance covered 100%--received this in January 2015.     She will need followup in 6 months for fasting labs and office visit. Followup sooner if needed.  Signed, [address] [place], [place], [facility] 03/25/2017 11:29 AM

## 2017-03-27 LAB — HM MAMMOGRAPHY

## 2017-03-29 ENCOUNTER — Encounter: Payer: Self-pay | Admitting: *Deleted

## 2017-05-05 ENCOUNTER — Other Ambulatory Visit: Payer: Self-pay | Admitting: Physician Assistant

## 2017-05-06 NOTE — Telephone Encounter (Signed)
Refill appropriate 

## 2017-07-03 ENCOUNTER — Ambulatory Visit (INDEPENDENT_AMBULATORY_CARE_PROVIDER_SITE_OTHER): Payer: BLUE CROSS/BLUE SHIELD | Admitting: Family Medicine

## 2017-07-03 DIAGNOSIS — Z23 Encounter for immunization: Secondary | ICD-10-CM

## 2017-08-07 ENCOUNTER — Encounter: Payer: Self-pay | Admitting: Podiatry

## 2017-08-07 ENCOUNTER — Other Ambulatory Visit: Payer: Self-pay | Admitting: Podiatry

## 2017-08-07 ENCOUNTER — Ambulatory Visit: Payer: BLUE CROSS/BLUE SHIELD | Admitting: Podiatry

## 2017-08-07 ENCOUNTER — Ambulatory Visit (INDEPENDENT_AMBULATORY_CARE_PROVIDER_SITE_OTHER): Payer: BLUE CROSS/BLUE SHIELD

## 2017-08-07 DIAGNOSIS — M79672 Pain in left foot: Secondary | ICD-10-CM | POA: Diagnosis not present

## 2017-08-07 DIAGNOSIS — M7750 Other enthesopathy of unspecified foot: Secondary | ICD-10-CM

## 2017-08-07 DIAGNOSIS — T148XXA Other injury of unspecified body region, initial encounter: Secondary | ICD-10-CM | POA: Diagnosis not present

## 2017-08-07 DIAGNOSIS — M779 Enthesopathy, unspecified: Secondary | ICD-10-CM

## 2017-08-07 DIAGNOSIS — M7662 Achilles tendinitis, left leg: Secondary | ICD-10-CM

## 2017-08-07 NOTE — Progress Notes (Signed)
   Subjective:    Patient ID: [name], female    DOB: Aug 29, 1951, 67 y.o.   MRN: [id number]  HPI    Review of Systems  All other systems reviewed and are negative.      Objective:   Physical Exam        Assessment & Plan:

## 2017-08-09 NOTE — Progress Notes (Signed)
Subjective:    Patient ID: [name], female   DOB: 66 y.o.   MRN: [id number]   HPI patient states she has a numerous year history of pain in the back of the left heel and states that she's already had a looked at by Dr. no she has spurs and it has become intensely discomforting over the last 3 years. States she cannot wear shoe gear and it has enlarged in size and is increasingly difficult for her to be active. Patient does not smoke and likes to be active    Review of Systems  All other systems reviewed and are negative.       Objective:  Physical Exam  Constitutional: She appears well-developed and well-nourished.  Cardiovascular: Intact distal pulses.  Pulmonary/Chest: Effort normal.  Musculoskeletal: Normal range of motion.  Neurological: She is alert.  Skin: Skin is warm.  Nursing note and vitals reviewed.  neurovascular status intact muscle strength adequate range of motion within normal limits with significant posterior inflammation pain of the Achilles at its insertion into the calcaneus. There does not appear to be tendon dysfunction but there is significant equinus condition and it is warm in the area with possibility for disruption of fibers     Assessment:   Significant posterior insertional Achilles tendinitis with possibility of tendon damage with large spur formation      Plan:    H&P condition reviewed at great length. At this point I am sending for MRI due to the significant enlargement of tissue surrounding the area and the inflammation and warmth present. Ultimately this is most likely can require surgery with posterior resection and I did go ahead and I reviewed the case with Dr. March Rummage who I'm going to refer the patient to. Also will most likely require gastroc lengthening  X-rays indicated that there is large posterior spur formation left

## 2017-08-21 ENCOUNTER — Inpatient Hospital Stay: Admission: RE | Admit: 2017-08-21 | Payer: 59 | Source: Ambulatory Visit

## 2017-09-16 ENCOUNTER — Telehealth: Payer: Self-pay | Admitting: *Deleted

## 2017-09-16 NOTE — Telephone Encounter (Signed)
[facility] states pt is scheduled for MRI of left ankle 09/19/2017. [employer] - [facility] states MRI of ankle left without contrast does not meet criteria for approval, requires PEER TO PEER. Information for PEER TO PEER routed to Dr. Paulla Dolly.

## 2017-09-19 ENCOUNTER — Ambulatory Visit
Admission: RE | Admit: 2017-09-19 | Discharge: 2017-09-19 | Disposition: A | Discharge status: Home / Self Care | Payer: BLUE CROSS/BLUE SHIELD | Source: Ambulatory Visit | Attending: Podiatry | Admitting: Podiatry

## 2017-09-25 ENCOUNTER — Encounter: Payer: Self-pay | Admitting: Podiatry

## 2017-09-25 ENCOUNTER — Ambulatory Visit: Payer: BLUE CROSS/BLUE SHIELD | Admitting: Podiatry

## 2017-09-25 DIAGNOSIS — M9262 Juvenile osteochondrosis of tarsus, left ankle: Secondary | ICD-10-CM | POA: Diagnosis not present

## 2017-09-25 DIAGNOSIS — M7662 Achilles tendinitis, left leg: Secondary | ICD-10-CM

## 2017-09-25 NOTE — Patient Instructions (Signed)
Pre-Operative Instructions  Congratulations, you have decided to take an important step towards improving your quality of life.  You can be assured that the doctors and staff at [facility] will be with you every step of the way.  Here are some important things you should know:  1. Plan to be at the surgery center/hospital at least 1 (one) hour prior to your scheduled time, unless otherwise directed by the surgical center/hospital staff.  You must have a responsible adult accompany you, remain during the surgery and drive you home.  Make sure you have directions to the surgical center/hospital to ensure you arrive on time. 2. If you are having surgery at [facility] or [facility], you will need a copy of your medical history and physical form from your family physician within one month prior to the date of surgery. We will give you a form for your primary physician to complete.  3. We make every effort to accommodate the date you request for surgery.  However, there are times where surgery dates or times have to be moved.  We will contact you as soon as possible if a change in schedule is required.   4. No aspirin/ibuprofen for one week before surgery.  If you are on aspirin, any non-steroidal anti-inflammatory medications (Mobic, Aleve, Ibuprofen) should not be taken seven (7) days prior to your surgery.  You make take Tylenol for pain prior to surgery.  5. Medications - If you are taking daily heart and blood pressure medications, seizure, reflux, allergy, asthma, anxiety, pain or diabetes medications, make sure you notify the surgery center/hospital before the day of surgery so they can tell you which medications you should take or avoid the day of surgery. 6. No food or drink after midnight the night before surgery unless directed otherwise by surgical center/hospital staff. 7. No alcoholic beverages 24-hours prior to surgery.  No smoking 24-hours prior or 24-hours after  surgery. 8. Wear loose pants or shorts. They should be loose enough to fit over bandages, boots, and casts. 9. Don't wear slip-on shoes. Sneakers are preferred. 10. Bring your boot with you to the surgery center/hospital.  Also bring crutches or a walker if your physician has prescribed it for you.  If you do not have this equipment, it will be provided for you after surgery. 11. If you have not been contacted by the surgery center/hospital by the day before your surgery, call to confirm the date and time of your surgery. 12. Leave-time from work may vary depending on the type of surgery you have.  Appropriate arrangements should be made prior to surgery with your employer. 13. Prescriptions will be provided immediately following surgery by your doctor.  Fill these as soon as possible after surgery and take the medication as directed. Pain medications will not be refilled on weekends and must be approved by the doctor. 14. Remove nail polish on the operative foot and avoid getting pedicures prior to surgery. 15. Wash the night before surgery.  The night before surgery wash the foot and leg well with water and the antibacterial soap provided. Be sure to pay special attention to beneath the toenails and in between the toes.  Wash for at least three (3) minutes. Rinse thoroughly with water and dry well with a towel.  Perform this wash unless told not to do so by your physician.  Enclosed: 1 Ice pack (please put in freezer the night before surgery)   1 Hibiclens skin cleaner     Pre-op instructions  If you have any questions regarding the instructions, please do not hesitate to call our office.  [place]: [address], [place], [place] [address] -- [phone]  [place]: [address] [place], [place] [address] -- [phone]  [place]: [address]  [place], [place] [address] -- [phone]  [place]: [address], [address], [place], [place] [address] -- [phone]  Website:  [email or web address] 

## 2017-09-25 NOTE — Progress Notes (Signed)
  Subjective:  Patient ID: [name], female    DOB: 10/31/1950,  MRN: [id number]  Chief Complaint  Patient presents with  . Foot Problem    i am here to get the results of my MRI    67 y.o. female returns for the above complaint.  Continues to have pain to the posterior left calcaneus and had MRI performed. Referred to me by Dr. Paulla Dolly for surgical evaluation.  Objective:  There were no vitals filed for this visit. General AA&O x3. Normal mood and affect.  Vascular Pedal pulses palpable.  Neurologic Epicritic sensation grossly intact.  Dermatologic No open lesions. Skin normal texture and turgor.  Orthopedic: Pain to palpation of the left posterior Achilles tendon prominent retrocalcaneal bursa with Haglund deformity.   Assessment & Plan:  Patient was evaluated and treated and all questions answered.  Haglund's Deformity, L -MRI reviewed with patient.  Insertional Achilles tendinopathy with retrocalcaneal bursitis with Haglund's deformity but Achilles tendon intact. -Discussed surgical treatment including exostectomy of the posterior calcaneus with detachment and reattachment of the Achilles tendon.  Would also perform concomitant gastrocnemius recession all risk benefits alternatives explained to patient.  Postoperative course explained at length.  All questions answered.  Consent for procedure reviewed and signed.  Patient wishes to have surgery in March after which she will have Medicare.  15 minutes of face to face time were spent with the patient. >50% of this was spent on counseling and coordination of care. Specifically discussed with patient the above diagnosis and treatment plan.

## 2017-09-26 ENCOUNTER — Encounter: Payer: BLUE CROSS/BLUE SHIELD | Admitting: Family Medicine

## 2017-09-26 ENCOUNTER — Other Ambulatory Visit: Payer: BLUE CROSS/BLUE SHIELD

## 2017-09-26 ENCOUNTER — Encounter: Payer: Self-pay | Admitting: Family Medicine

## 2017-09-26 DIAGNOSIS — I10 Essential (primary) hypertension: Secondary | ICD-10-CM

## 2017-09-26 DIAGNOSIS — Z0000 Encounter for general adult medical examination without abnormal findings: Secondary | ICD-10-CM

## 2017-09-26 DIAGNOSIS — E785 Hyperlipidemia, unspecified: Secondary | ICD-10-CM

## 2017-09-26 DIAGNOSIS — Z1159 Encounter for screening for other viral diseases: Secondary | ICD-10-CM

## 2017-09-27 LAB — CBC WITH DIFFERENTIAL/PLATELET
BASOS ABS: 29 {cells}/uL (ref 0–200)
Basophils Relative: 0.5 %
EOS ABS: 220 {cells}/uL (ref 15–500)
Eosinophils Relative: 3.8 %
HCT: 42.7 % (ref 35.0–45.0)
HEMOGLOBIN: 14 g/dL (ref 11.7–15.5)
Lymphs Abs: 2117 cells/uL (ref 850–3900)
MCH: 28.1 pg (ref 27.0–33.0)
MCHC: 32.8 g/dL (ref 32.0–36.0)
MCV: 85.7 fL (ref 80.0–100.0)
MONOS PCT: 9.9 %
MPV: 9.8 fL (ref 7.5–12.5)
NEUTROS ABS: 2859 {cells}/uL (ref 1500–7800)
NEUTROS PCT: 49.3 %
Platelets: 231 10*3/uL (ref 140–400)
RBC: 4.98 10*6/uL (ref 3.80–5.10)
RDW: 13.2 % (ref 11.0–15.0)
Total Lymphocyte: 36.5 %
WBC mixed population: 574 cells/uL (ref 200–950)
WBC: 5.8 10*3/uL (ref 3.8–10.8)

## 2017-09-27 LAB — COMPREHENSIVE METABOLIC PANEL
AG RATIO: 1.9 (calc) (ref 1.0–2.5)
ALT: 15 U/L (ref 6–29)
AST: 15 U/L (ref 10–35)
Albumin: 4.1 g/dL (ref 3.6–5.1)
Alkaline phosphatase (APISO): 46 U/L (ref 33–130)
BILIRUBIN TOTAL: 0.5 mg/dL (ref 0.2–1.2)
BUN: 15 mg/dL (ref 7–25)
CALCIUM: 9.2 mg/dL (ref 8.6–10.4)
CO2: 27 mmol/L (ref 20–32)
Chloride: 105 mmol/L (ref 98–110)
Creat: 0.69 mg/dL (ref 0.50–0.99)
GLUCOSE: 95 mg/dL (ref 65–99)
Globulin: 2.2 g/dL (calc) (ref 1.9–3.7)
Potassium: 3.7 mmol/L (ref 3.5–5.3)
Sodium: 142 mmol/L (ref 135–146)
Total Protein: 6.3 g/dL (ref 6.1–8.1)

## 2017-09-27 LAB — LIPID PANEL
Cholesterol: 187 mg/dL (ref <200)
HDL: 60 mg/dL (ref >50)
LDL Cholesterol (Calc): 108 mg/dL (calc) — ABNORMAL HIGH
Non-HDL Cholesterol (Calc): 127 mg/dL (calc) (ref <130)
TRIGLYCERIDES: 95 mg/dL (ref <150)
Total CHOL/HDL Ratio: 3.1 (calc) (ref <5.0)

## 2017-09-27 LAB — HEPATITIS C ANTIBODY
Hepatitis C Ab: NONREACTIVE
SIGNAL TO CUT-OFF: 0.03 (ref <1.00)

## 2017-09-27 LAB — TSH: TSH: 2.55 mIU/L (ref 0.40–4.50)

## 2017-09-27 NOTE — Progress Notes (Signed)
This encounter was created in error - please disregard.

## 2017-09-30 ENCOUNTER — Encounter: Payer: BLUE CROSS/BLUE SHIELD | Admitting: Physician Assistant

## 2017-10-23 ENCOUNTER — Encounter: Payer: Self-pay | Admitting: Physician Assistant

## 2017-10-23 ENCOUNTER — Ambulatory Visit (INDEPENDENT_AMBULATORY_CARE_PROVIDER_SITE_OTHER): Payer: BLUE CROSS/BLUE SHIELD | Admitting: Physician Assistant

## 2017-10-23 ENCOUNTER — Telehealth: Payer: Self-pay | Admitting: *Deleted

## 2017-10-23 ENCOUNTER — Other Ambulatory Visit: Payer: Self-pay

## 2017-10-23 VITALS — BP 132/82 | HR 60 | Temp 97.7°F | Resp 14 | Ht 68.25 in | Wt 214.6 lb

## 2017-10-23 DIAGNOSIS — Z0000 Encounter for general adult medical examination without abnormal findings: Secondary | ICD-10-CM

## 2017-10-23 DIAGNOSIS — E785 Hyperlipidemia, unspecified: Secondary | ICD-10-CM | POA: Diagnosis not present

## 2017-10-23 DIAGNOSIS — I10 Essential (primary) hypertension: Secondary | ICD-10-CM

## 2017-10-23 NOTE — Telephone Encounter (Signed)
Per Levada Dy, patient asked if she signed consent forms.  I left her a message that I was returning her call and that yes, all paperwork was signed.  I asked her to give me a call if she had any other questions.

## 2017-10-23 NOTE — Progress Notes (Signed)
Patient ID: [name] MRN: [id number], DOB: 1951-05-09, 67 y.o. Date of Encounter: 10/23/2017,   Chief Complaint: Physical (CPE)  HPI: 67 y.o. y/o female  here for CPE.   She has no specific concerns to address today. States that she has been feeling fine and everything is been stable.  She is taking HCTZ daily for hypertension.  Having no lightheadedness or other adverse effects.  She is taking the simvastatin 10 mg daily for hyperlipidemia.  This is causing no myalgias or other adverse effects.  Taking Pepcid as needed for GERD.  For further details and further history see note dated 03/25/17.  That note did pull forward prior information.   Review of Systems: Consitutional: No fever, chills, fatigue, night sweats, lymphadenopathy. No significant/unexplained weight changes. Eyes: No visual changes, eye redness, or discharge. ENT/Mouth: No ear pain, sore throat, nasal drainage, or sinus pain. Cardiovascular: No chest pressure,heaviness, tightness or squeezing, even with exertion. No increased shortness of breath or dyspnea on exertion.No palpitations, edema, orthopnea, PND. Respiratory: No cough, hemoptysis, SOB, or wheezing. Gastrointestinal: No anorexia, dysphagia, reflux, pain, nausea, vomiting, hematemesis, diarrhea, constipation, BRBPR, or melena. Breast: No mass, nodules, bulging, or retraction. No skin changes or inflammation. No nipple discharge. No lymphadenopathy. Genitourinary: No dysuria, hematuria, incontinence, vaginal discharge, pruritis, burning, abnormal bleeding, or pain. Musculoskeletal: No decreased ROM, No joint pain or swelling. No significant pain in neck, back, or extremities. Skin: No rash, pruritis, or concerning lesions. Neurological: No headache, dizziness, syncope, seizures, tremors, memory loss, coordination problems, or paresthesias. Psychological: No anxiety, depression, hallucinations, SI/HI. Endocrine: No polydipsia, polyphagia, polyuria, or  known diabetes.No increased fatigue. No palpitations/rapid heart rate. No significant/unexplained weight change. All other systems were reviewed and are otherwise negative.  Past Medical History:  Diagnosis Date  . Anxiety   . Arthritis    knees   . GERD (gastroesophageal reflux disease)   . Heart murmur   . Hyperlipidemia   . Hypertension   . Microscopic hematuria   . Obesity      Past Surgical History:  Procedure Laterality Date  . COLONOSCOPY    . KNEE ARTHROSCOPY Left 04/22/2013   Procedure: LEFT KNEE ARTHROSCOPY WITH medial meniscusectomy and chondroplasty;  Surgeon: Gearlean Alf, MD;  Location: [facility];  Service: Orthopedics;  Laterality: Left;  . TONSILLECTOMY    . TUBAL LIGATION      Home Meds:  Outpatient Medications Prior to Visit  Medication Sig Dispense Refill  . aspirin 81 MG tablet Take 81 mg by mouth at bedtime.     . calcium carbonate (OS-CAL) 600 MG TABS Take 600 mg by mouth daily.    . cholecalciferol (VITAMIN D) 1000 UNITS tablet Take 1,000 Units by mouth daily.     . fish oil-omega-3 fatty acids 1000 MG capsule Take 2 g by mouth daily.     . hydrochlorothiazide (HYDRODIURIL) 25 MG tablet TAKE 1 TABLET BY MOUTH EVERY DAY 90 tablet 1  . ibuprofen (ADVIL,MOTRIN) 200 MG tablet Take 600 mg by mouth every 6 (six) hours as needed. For pain    . Multiple Vitamin (MULTIVITAMIN) tablet Take 1 tablet by mouth daily.      . ranitidine (ZANTAC) 150 MG capsule Take 150 mg by mouth daily.    . simvastatin (ZOCOR) 10 MG tablet TAKE 1 TABLET BY MOUTH EVERY DAY AT 6PM 90 tablet 1   No facility-administered medications prior to visit.     Allergies: No Known Allergies  Social History  Socioeconomic History  . Marital status: Married    Spouse name: Not on file  . Number of children: Not on file  . Years of education: Not on file  . Highest education level: Not on file  Social Needs  . Financial resource strain: Not on file  . Food insecurity - worry: Not on file   . Food insecurity - inability: Not on file  . Transportation needs - medical: Not on file  . Transportation needs - non-medical: Not on file  Occupational History  . Not on file  Tobacco Use  . Smoking status: Former Research scientist (life sciences)  . Smokeless tobacco: Never Used  Substance and Sexual Activity  . Alcohol use: Yes    Comment: rare  . Drug use: No  . Sexual activity: Yes    Birth control/protection: Surgical  Other Topics Concern  . Not on file  Social History Narrative   Worked in [employer].   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       No other exercise.   Married.       Never smoked.    Family History  Problem Relation Age of Onset  . Heart disease Mother 62       MI at 31 and 23  . Cancer Father 78       Prostate Cancer  . Colon polyps Father   . Stomach cancer Father   . Esophageal cancer Neg Hx   . Rectal cancer Neg Hx     Physical Exam: Blood pressure 132/82, pulse 60, temperature 97.7 F (36.5 C), temperature source Oral, resp. rate 14, height 5' 8.25" (1.734 m), weight 97.3 kg (214 lb 9.6 oz), SpO2 98 %., Body mass index is 32.39 kg/m. General: Tall, Obese WF. Appears in no acute distress. HEENT: Normocephalic, atraumatic. Conjunctiva pink, sclera non-icteric. Pupils 2 mm constricting to 1 mm, round, regular, and equally reactive to light and accomodation. EOMI. Internal auditory canal clear. TMs with good cone of light and without pathology. Nasal mucosa pink. Nares are without discharge. No sinus tenderness. Oral mucosa pink. Neck: Supple. Trachea midline. No thyromegaly. Full ROM. No lymphadenopathy.No Carotid Bruits. Lungs: Clear to auscultation bilaterally without wheezes, rales, or rhonchi. Breathing is of normal effort and unlabored. Cardiovascular: RRR with S1 S2. No murmurs, rubs, or gallops. Distal pulses 2+ symmetrically. No carotid or abdominal bruits. Breast: Symmetrical. No masses. Nipples without  discharge. Abdomen: Soft, non-tender, non-distended with normoactive bowel sounds. No hepatosplenomegaly or masses. No rebound/guarding. No CVA tenderness. No hernias.  Genitourinary:  External genitalia without lesions. Vaginal mucosa pink.No discharge present. Cervix pink and without discharge. No cervical tenderness.Normal uterus size. No adnexal mass or tenderness.   Musculoskeletal: Full range of motion and 5/5 strength throughout. Without swelling, atrophy, tenderness, crepitus, or warmth. Skin: Warm and moist without erythema, ecchymosis, wounds, or rash. Neuro: A+Ox3. CN II-XII grossly intact. Moves all extremities spontaneously. Full sensation throughout. Normal gait.  Psych:  Responds to questions appropriately with a normal affect.   Assessment/Plan:  67 y.o. y/o female here for CPE  1. Encounter for preventive health examination  A. Screening Labs: Came and had fasting labs on 09/26/2017.  Those results were reviewed today.  All normal.  Lipids at goal on current statin.  B. Pap: Last Pap smear was 09/08/2014.  Normal.  Cytology normal.  HPV also negative. Guidelines are to repeat every 3-5 years so can hold off on repeating this.  C. Screening Mammogram: She has mammograms performed at [facility].  Last was on 03/27/2017.  Negative.  D. DEXA/BMD:  Will start bone density scans at age 74.  She has no risk factors to need to start this prior to that age.  E. Colorectal Cancer Screening: Had colonoscopy 10/09/2007.  Reports stated to repeat 7 years. She had repeat colonoscopy 11/09/2014 by Dr. Olevia Perches.   Patient reports that it did show some polyps and she thinks she is supposed to repeat 5 years but not certain.   I reviewed the colonoscopy report but it just says that follow-up will depend on pathology.  I looked up the pathology.  I do not see what it says about when to repeat colonoscopy.  F. Immunizations:  Influenza: She has already received flu vaccine for this  season. Tetanus: She received Tdap 04/2011.  This is up-to-date. Pneumococcal: She has no indication to require pneumonia vaccine until age 36. Zostavax: She received Zostavax January 2015.    2. Essential hypertension Blood pressure at goal.  Continue current medication.  3. Hyperlipidemia, unspecified hyperlipidemia type Lipids are at goal.  Continue current statin.  Routine visit and labs in 6 months.  CPE in 1 year.  Follow-up sooner if needed.     [address] [place], [place], [facility] 10/23/2017 5:35 PM

## 2017-10-31 ENCOUNTER — Telehealth: Payer: Self-pay | Admitting: *Deleted

## 2017-10-31 NOTE — Telephone Encounter (Signed)
Left message informing pt she will get a surgery boot at the time of her surgery.

## 2017-10-31 NOTE — Telephone Encounter (Signed)
Pt states she did not get a boot for her surgery 11/20/2017, she does have a night splint.

## 2017-11-07 ENCOUNTER — Telehealth: Payer: Self-pay | Admitting: *Deleted

## 2017-11-07 NOTE — Telephone Encounter (Signed)
I called the patient and left her a message to call me regarding her insurance.  She's scheduled for surgery on 11/20/2017.  However her insurance terminates on 11/14/2017.  "I'm returning your call."  I was calling you in regards to your insurance.  We have you scheduled for surgery on 11/20/2017.  Your insurance expires on 11/14/2017.  "Yes, my Medicare kicks in on March 1.  Do you need the insurance number?  I also have a supplement of [facility]."  Yes, please give me the numbers.  "My Medicare number is [id number] and [facility] is [phone].  I called and gave the information to Caren Griffins at [facility]

## 2017-11-15 ENCOUNTER — Other Ambulatory Visit: Payer: Self-pay

## 2017-11-15 MED ORDER — CALCIUM CARBONATE 600 MG PO TABS: 600.0000 mg | ORAL_TABLET | Freq: Every day | ORAL | 1 refills | Status: DC

## 2017-11-15 MED ORDER — ASPIRIN 81 MG PO TABS: 81.0000 mg | ORAL_TABLET | Freq: Every day | ORAL | 1 refills | Status: DC

## 2017-11-15 MED ORDER — SIMVASTATIN 10 MG PO TABS: ORAL_TABLET | ORAL | 1 refills | Status: DC

## 2017-11-15 MED ORDER — RANITIDINE HCL 150 MG PO CAPS: 150.0000 mg | ORAL_CAPSULE | Freq: Every day | ORAL | 1 refills | Status: DC

## 2017-11-15 MED ORDER — ONE-DAILY MULTI VITAMINS PO TABS: 1.0000 | ORAL_TABLET | Freq: Every day | ORAL | 1 refills | Status: DC

## 2017-11-15 MED ORDER — VITAMIN D 1000 UNITS PO TABS: 1000.0000 [IU] | ORAL_TABLET | Freq: Every day | ORAL | 1 refills | Status: DC

## 2017-11-15 NOTE — Telephone Encounter (Signed)
Patient called and left a message requesting all of her medications be sent to [facility] on [address]. Rx change has been made

## 2017-11-18 ENCOUNTER — Other Ambulatory Visit: Payer: Self-pay

## 2017-11-18 MED ORDER — HYDROCHLOROTHIAZIDE 25 MG PO TABS: 25.0000 mg | ORAL_TABLET | Freq: Every day | ORAL | 1 refills | Status: DC

## 2017-11-20 ENCOUNTER — Other Ambulatory Visit: Payer: Self-pay | Admitting: Podiatry

## 2017-11-20 ENCOUNTER — Encounter: Payer: Self-pay | Admitting: Podiatry

## 2017-11-20 DIAGNOSIS — M2022 Hallux rigidus, left foot: Secondary | ICD-10-CM

## 2017-11-20 DIAGNOSIS — M62462 Contracture of muscle, left lower leg: Secondary | ICD-10-CM | POA: Diagnosis not present

## 2017-11-20 DIAGNOSIS — M7662 Achilles tendinitis, left leg: Secondary | ICD-10-CM

## 2017-11-20 DIAGNOSIS — E7800 Pure hypercholesterolemia, unspecified: Secondary | ICD-10-CM | POA: Diagnosis not present

## 2017-11-20 DIAGNOSIS — M7732 Calcaneal spur, left foot: Secondary | ICD-10-CM

## 2017-11-20 DIAGNOSIS — M216X2 Other acquired deformities of left foot: Secondary | ICD-10-CM | POA: Diagnosis not present

## 2017-11-20 MED ORDER — HYDROMORPHONE HCL 2 MG PO TABS: 1.0000 mg | ORAL_TABLET | ORAL | 0 refills | Status: DC | PRN

## 2017-11-20 MED ORDER — CEPHALEXIN 500 MG PO CAPS
500.0000 mg | ORAL_CAPSULE | Freq: Two times a day (BID) | ORAL | 0 refills | Status: DC
Start: 2017-11-20 — End: 2018-04-30

## 2017-11-20 MED ORDER — ONDANSETRON HCL 4 MG PO TABS: 4.0000 mg | ORAL_TABLET | Freq: Three times a day (TID) | ORAL | 0 refills | Status: DC | PRN

## 2017-11-21 ENCOUNTER — Telehealth: Payer: Self-pay | Admitting: Podiatry

## 2017-11-21 NOTE — Telephone Encounter (Signed)
Pt states her toes are swollen, heel feels swollen and can't get the ice to the areas. I told pt to remove the ace wrap, elevate foot for 15 minutes then place foot level with hip and rewrap the ace starting at the toes just a little looser, and to place the ice pack at the ankle during this period of time or behind the knee. Pt states the pain medication does not help broken in 1/2, would like to know if she can take a whole tablet every 6 hours. I told pt I would check with Dr. March Rummage and call again.

## 2017-11-21 NOTE — Telephone Encounter (Signed)
I had surgery yesterday on my heel by Dr. March Rummage. It is in a splint like a cast and it is swelling up in there. There is a lot of pressure in the heel and there is a lot of pressure with like a burning sensation ever since. I don't know how I'm supposed to ice it because the cold won't go through that thing. If you would please call me back at [phone]. Thank you.

## 2017-11-22 ENCOUNTER — Telehealth: Payer: Self-pay

## 2017-11-22 NOTE — Progress Notes (Signed)
Resection of haglund's deformity left calcaneus with detachment/reattachment of Achilles tendon. Gastrocnemiusrecession left.

## 2017-11-27 ENCOUNTER — Ambulatory Visit (INDEPENDENT_AMBULATORY_CARE_PROVIDER_SITE_OTHER): Payer: Self-pay | Admitting: Podiatry

## 2017-11-27 ENCOUNTER — Encounter: Payer: Self-pay | Admitting: Podiatry

## 2017-11-27 VITALS — BP 127/76 | HR 71 | Temp 96.7°F

## 2017-11-27 DIAGNOSIS — M9262 Juvenile osteochondrosis of tarsus, left ankle: Secondary | ICD-10-CM

## 2017-11-27 DIAGNOSIS — M7662 Achilles tendinitis, left leg: Secondary | ICD-10-CM

## 2017-12-04 ENCOUNTER — Ambulatory Visit (INDEPENDENT_AMBULATORY_CARE_PROVIDER_SITE_OTHER): Payer: Self-pay | Admitting: Podiatry

## 2017-12-04 ENCOUNTER — Encounter: Payer: Self-pay | Admitting: Podiatry

## 2017-12-04 DIAGNOSIS — T148XXA Other injury of unspecified body region, initial encounter: Secondary | ICD-10-CM

## 2017-12-04 DIAGNOSIS — M7662 Achilles tendinitis, left leg: Secondary | ICD-10-CM

## 2017-12-04 NOTE — Progress Notes (Signed)
Subjective:   Patient ID: [name], female   DOB: 67 y.o.   MRN: [id number]   HPI Patient presents stating that she is improving with discomfort still if she tries to be active but is continuing to be nonweightbearing on her foot and lower leg   ROS      Objective:  Physical Exam  Neurovascular status was intact negative Homans sign was noted with incision sites intact left gastroc and left posterior heel with staples in place and no indications of drainage or other pathology     Assessment:  Doing well post gastroc recession posterior heel resection     Plan:  H&P condition reviewed and sterile dressing reapplied with instructions for continued nonweightbearing for the foot and lower leg.  Reappoint for Dr. March Rummage to evaluate in the next 2 weeks

## 2017-12-12 ENCOUNTER — Ambulatory Visit (INDEPENDENT_AMBULATORY_CARE_PROVIDER_SITE_OTHER): Payer: Medicare Other | Admitting: Podiatry

## 2017-12-12 ENCOUNTER — Ambulatory Visit (INDEPENDENT_AMBULATORY_CARE_PROVIDER_SITE_OTHER): Payer: Medicare Other

## 2017-12-12 DIAGNOSIS — M9262 Juvenile osteochondrosis of tarsus, left ankle: Secondary | ICD-10-CM

## 2017-12-12 NOTE — Progress Notes (Signed)
  Subjective:  Patient ID: [name], female    DOB: 1950-10-09,  MRN: [id number]  Chief Complaint  Patient presents with  . Routine Post Op    post op #3    DOS: 11/20/2017 Procedure: Left gastrocnemius recession, calcaneus exostectomy with tendon debridement and detachment reattachment of the Achilles tendon with suture anchors  67 y.o. female returns for post-op check. Denies N/V/F/Ch. Pain is controlled with current medications.  Reports compliance with nonweightbearing status.  Pain is much better at this visit.  Denies new issues.    Objective:   General AA&O x3. Normal mood and affect.  Vascular Foot warm and well perfused.  Neurologic Gross sensation intact.  Dermatologic Skin healing well with intact suture and staple material skin edges well coapted.  Orthopedic: Tenderness to palpation noted about the surgical site.  Full course of Achilles tendon palpable without deficit.  Tendon appears intact upon dorsal to plantar flexion of the ankle joint.  No appreciable step-offs.  Negative Homan.    Assessment & Plan:  Patient was evaluated and treated and all questions answered.  S/p left gastric imaging session, Haglund's deformity resection -Progressing as expected post-operatively. -Sutures: Removed today.  Staples left intact.. -Medications refilled: None -Foot redressed. -Start PT X-rays taken reviewed show significant reduction of the posterior calcaneal spurring  Return in about 2 weeks (around 12/26/2017).

## 2017-12-19 ENCOUNTER — Telehealth: Payer: Self-pay | Admitting: *Deleted

## 2017-12-19 DIAGNOSIS — M9262 Juvenile osteochondrosis of tarsus, left ankle: Secondary | ICD-10-CM

## 2017-12-19 NOTE — Telephone Encounter (Signed)
Dr. March Rummage ordered PT with [facility].

## 2017-12-23 ENCOUNTER — Encounter: Payer: Self-pay | Admitting: Podiatry

## 2017-12-23 ENCOUNTER — Ambulatory Visit (INDEPENDENT_AMBULATORY_CARE_PROVIDER_SITE_OTHER): Payer: Medicare Other | Admitting: Podiatry

## 2017-12-23 ENCOUNTER — Ambulatory Visit (INDEPENDENT_AMBULATORY_CARE_PROVIDER_SITE_OTHER): Payer: Medicare Other

## 2017-12-23 DIAGNOSIS — R269 Unspecified abnormalities of gait and mobility: Secondary | ICD-10-CM | POA: Diagnosis not present

## 2017-12-23 DIAGNOSIS — T8130XA Disruption of wound, unspecified, initial encounter: Secondary | ICD-10-CM

## 2017-12-23 DIAGNOSIS — L03116 Cellulitis of left lower limb: Secondary | ICD-10-CM

## 2017-12-23 DIAGNOSIS — M9262 Juvenile osteochondrosis of tarsus, left ankle: Secondary | ICD-10-CM | POA: Diagnosis not present

## 2017-12-23 DIAGNOSIS — M79672 Pain in left foot: Secondary | ICD-10-CM | POA: Diagnosis not present

## 2017-12-23 DIAGNOSIS — M25475 Effusion, left foot: Secondary | ICD-10-CM | POA: Diagnosis not present

## 2017-12-23 DIAGNOSIS — M25472 Effusion, left ankle: Secondary | ICD-10-CM | POA: Diagnosis not present

## 2017-12-23 MED ORDER — CEPHALEXIN 500 MG PO CAPS: 500.0000 mg | ORAL_CAPSULE | Freq: Three times a day (TID) | ORAL | 0 refills | Status: DC

## 2017-12-23 NOTE — Patient Instructions (Signed)

## 2017-12-24 LAB — C-REACTIVE PROTEIN: CRP: 10.9 mg/L — ABNORMAL HIGH (ref <8.0)

## 2017-12-24 LAB — CBC WITH DIFFERENTIAL/PLATELET
BASOS ABS: 43 {cells}/uL (ref 0–200)
Basophils Relative: 0.6 %
EOS ABS: 192 {cells}/uL (ref 15–500)
Eosinophils Relative: 2.7 %
HEMATOCRIT: 43.4 % (ref 35.0–45.0)
HEMOGLOBIN: 14.6 g/dL (ref 11.7–15.5)
Lymphs Abs: 1882 cells/uL (ref 850–3900)
MCH: 28.6 pg (ref 27.0–33.0)
MCHC: 33.6 g/dL (ref 32.0–36.0)
MCV: 85.1 fL (ref 80.0–100.0)
MONOS PCT: 9.7 %
MPV: 10.1 fL (ref 7.5–12.5)
NEUTROS ABS: 4296 {cells}/uL (ref 1500–7800)
Neutrophils Relative %: 60.5 %
Platelets: 261 10*3/uL (ref 140–400)
RBC: 5.1 10*6/uL (ref 3.80–5.10)
RDW: 12.8 % (ref 11.0–15.0)
Total Lymphocyte: 26.5 %
WBC mixed population: 689 cells/uL (ref 200–950)
WBC: 7.1 10*3/uL (ref 3.8–10.8)

## 2017-12-24 LAB — SEDIMENTATION RATE: Sed Rate: 17 mm/h (ref 0–30)

## 2017-12-24 NOTE — Progress Notes (Signed)
Subjective: [name] is a 67 y.o. is seen today in office today as an acute appointment.  She recently had Achilles tendon repair as well as removal of bone spur to the left foot with Dr. Price.  She was in physical therapy today when the physical therapist came over to have me evaluate the wound.  When the therapist was taken off the bandage she noticed pus coming from the incision and the incision was opened.  Because of this I put her on my schedule for evaluation.  Denies any systemic complaints such as fevers, chills, nausea, vomiting. No calf pain, chest pain, shortness of breath.   Objective: General: No acute distress, AAOx3  DP/PT pulses palpable 2/4, CRT < 3 sec to all digits.  Protective sensation intact. Motor function intact.  On the posterior aspect the left heel there is wound dehiscence towards the distal portion of the incision and there is purulence expressed from the wound.  There is erythema extending to the medial lateral aspects of the heel.  Able to probe down to the Achilles tendon towards the distal portion of the incision.  It appears that the tendon is intact given discomfort unable to fully evaluated.  Thompson test is negative however.  There is no fluctuation or crepitation.  There is no ascending cellulitis. No pain with calf compression, swelling, warmth, erythema.   Assessment and Plan:  Status post left Achilles surgery with wound dehiscence and infection  -Treatment options discussed including all alternatives, risks, and complications -X-rays were obtained and reviewed.  There is changes to the calcaneus is likely postsurgical.  No soft tissue emphysema is present. -The wound was cleaned today.  Wound culture was also obtained of the purulence.  I ordered ESR, CRP, CBC.  I discussed with her surgical intervention at this point I recommended surgery for wound debridement, incision and drainage as well as for possible tendon repair if needed.  I discussed the  surgery as well as the postoperative course.  I also called Dr. Price to discuss this and will get her scheduled with Dr. Price on Wednesday for surgery. -The incision placement as well as the postoperative course was discussed with the patient. I discussed risks of the surgery which include, but not limited to, infection, bleeding, pain, swelling, need for further surgery, delayed or nonhealing, painful or ugly scar, numbness or sensation changes, over/under correction, recurrence, transfer lesions, further deformity, hardware failure, DVT/PE, loss of toe/foot. Patient understands these risks and wishes to proceed with surgery. The surgical consent was reviewed with the patient all 3 pages were signed. No promises or guarantees were given to the outcome of the procedure. All questions were answered to the best of my ability. Before the surgery the patient was encouraged to call the office if there is any further questions. The surgery will be performed at the [facility] on an outpatient basis with Dr. Price. -Keflex -Remain nonweightbearing.  -I padded the area thoroughly and she will remain in the cam boot for now -Monitor for any clinical signs or symptoms of infection and directed to call the office immediately should any occur or go to the ER.  Matthew R Wagoner DPM   

## 2017-12-25 DIAGNOSIS — T8131XD Disruption of external operation (surgical) wound, not elsewhere classified, subsequent encounter: Secondary | ICD-10-CM | POA: Diagnosis not present

## 2017-12-25 DIAGNOSIS — T8131XA Disruption of external operation (surgical) wound, not elsewhere classified, initial encounter: Secondary | ICD-10-CM | POA: Diagnosis not present

## 2017-12-25 DIAGNOSIS — I10 Essential (primary) hypertension: Secondary | ICD-10-CM | POA: Diagnosis not present

## 2017-12-26 ENCOUNTER — Encounter: Payer: Medicare Other | Admitting: Podiatry

## 2017-12-26 ENCOUNTER — Other Ambulatory Visit: Payer: Self-pay | Admitting: Podiatry

## 2017-12-26 ENCOUNTER — Telehealth: Payer: Self-pay

## 2017-12-26 LAB — WOUND CULTURE
MICRO NUMBER: 90429783
SPECIMEN QUALITY: ADEQUATE

## 2017-12-26 MED ORDER — DELAFLOXACIN MEGLUMINE 450 MG PO TABS: 450.0000 mg | ORAL_TABLET | Freq: Two times a day (BID) | ORAL | 0 refills | Status: DC

## 2017-12-26 NOTE — Telephone Encounter (Signed)
Spoke to patient. She is doing ok. She is not taking any pain medicine. She will keep her follow up appointment tomorrow

## 2017-12-27 ENCOUNTER — Telehealth: Payer: Self-pay | Admitting: *Deleted

## 2017-12-27 ENCOUNTER — Telehealth: Payer: Self-pay | Admitting: Podiatry

## 2017-12-27 ENCOUNTER — Ambulatory Visit (INDEPENDENT_AMBULATORY_CARE_PROVIDER_SITE_OTHER): Payer: Self-pay | Admitting: Podiatry

## 2017-12-27 DIAGNOSIS — T148XXA Other injury of unspecified body region, initial encounter: Secondary | ICD-10-CM

## 2017-12-27 DIAGNOSIS — T8130XA Disruption of wound, unspecified, initial encounter: Secondary | ICD-10-CM

## 2017-12-27 DIAGNOSIS — M7662 Achilles tendinitis, left leg: Secondary | ICD-10-CM

## 2017-12-27 DIAGNOSIS — M9262 Juvenile osteochondrosis of tarsus, left ankle: Secondary | ICD-10-CM

## 2017-12-27 NOTE — Telephone Encounter (Signed)
Faxed required form, Dr. Leigh Aurora LOV, and wound culture to [facility].

## 2017-12-27 NOTE — Telephone Encounter (Signed)
refaxed [place] with Dr. Eleanora Neighbor LOV 12/27/2017.

## 2017-12-27 NOTE — Telephone Encounter (Signed)
Clinicals done 

## 2017-12-27 NOTE — Progress Notes (Signed)
  Subjective:  Patient ID: [name], female    DOB: 1951-07-01,  MRN: [id number]  Chief Complaint  Patient presents with  . Routine Post Op     pov#1 dos 04.10.2019 Left Heel Wound Debridement Lt; Left Heel I&D; Repair Tendon Lt -Dr. March Rummage    DOS: 11/20/2017 Procedure: Left gastrocnemius recession, calcaneus exostectomy with tendon debridement and detachment reattachment of the Achilles tendon with suture anchors  DOS: 12/25/17 Procedure: Repair of wound dehiscence.  67 y.o. female returns for post-op check. Pain controlled. Presents with intact dressing. Underwent wound debridement and closure due to wound dehiscence. Denis N/V/F/Ch.  Objective:   General AA&O x3. Normal mood and affect.  Vascular Foot warm and well perfused. Edema noted.  Neurologic Gross sensation intact.  Dermatologic Gastrocnemius incision healed. Staples intact. Proximal half of Achilles incision healed. Staples intact. Distal half with skin edges coapted with intact suture material. Active sanguinous drainage. No purulence. No erythema.    Orthopedic: Tenderness to palpation noted about the surgical site. Tendon palpable without deficit.    Assessment & Plan:  Patient was evaluated and treated and all questions answered.  S/p left Gastrocnemius recession, Haglund's deformity resection with RTOR for repair of wound dehiscence. -Wound without signs of active infection today. -Wound culture reviewed. E coli - limited oral options but sensitive to fluoroquinolones. Rx [place]. If not covered would consider Ciprofloxacin however concern for increased risk of tendon rupture. -Refer to [facility]. Wound care consisting of betadine WTD qOD. Home PT to work on passive and active ROM. -Will continue NWB until wound heals. -Betadine WTD placed today. -F/u in 1 week for recheck.   No follow-ups on file.

## 2017-12-27 NOTE — Telephone Encounter (Signed)
I saw Dr. March Rummage today and he prescribed an antibiotic to a pharmacy in [place]. He stated the medication would be covered under Medicare but it is not. My cost would be $767.00 which I cannot afford. Can you get with him on that and see if he wants me to up what I'm taking or do something else locally at [facility] that I can get. Please call me back at [phone]. I appreciate it. Thank you.

## 2017-12-27 NOTE — Telephone Encounter (Signed)
Dr. March Rummage ordered [facility] betadine to left heel cover with wet to dry dressing qod and evaluate and treat for home PT. Faxed required form, orders and demographics with note the clinicals 12/27/2017 will follow when available.

## 2017-12-27 NOTE — Telephone Encounter (Signed)
Rx Baxdela to the pharmacy that works with [employer]. If cost still prohibitive, Rx Cipro 250mg  BID #[id number]

## 2017-12-30 NOTE — Telephone Encounter (Signed)
I informed pt our office had sent rx request to another pharmacy for other programs for cost cutting, to continue the antibiotic and if she could not afford we would put her on another antibiotic.

## 2017-12-31 ENCOUNTER — Telehealth: Payer: Self-pay | Admitting: Podiatry

## 2017-12-31 ENCOUNTER — Telehealth: Payer: Self-pay | Admitting: *Deleted

## 2017-12-31 NOTE — Telephone Encounter (Signed)
Shanon Brow - [facility] states funding is available to assist pt with the Baxdela, if pt would like to be placed in for funding please have her contact him.

## 2017-12-31 NOTE — Telephone Encounter (Signed)
[name] - [facility] states their [place] agency could not accept pt at this time and she has spoken with [name] and he will accept pt and will pull the orders from Epic. Faxed order to [facility]

## 2017-12-31 NOTE — Telephone Encounter (Signed)
I informed pt of Shanon Brow - [facility]'s offer and gave the contact number.

## 2017-12-31 NOTE — Telephone Encounter (Signed)
I'm a pt of Dr. Eleanora Neighbor and I had a call yesterday that Dr. March Rummage was calling in a prescription and that I should be hearing back from the pharmacy. I have not heard back from the pharmacy. I just wanted to give an update on that and see what we need to do next. Thank you.

## 2017-12-31 NOTE — Telephone Encounter (Signed)
[name] - [facility] called for [employer] information.

## 2017-12-31 NOTE — Telephone Encounter (Signed)
[place] states pt's insurance leaves pt with high co-pay for [place], does Dr. March Rummage want to change antibiotic.

## 2017-12-31 NOTE — Telephone Encounter (Signed)
Tiffany - [facility] states pt's insurance is out of network. I spoke with Dawn - [facility] she states they do accept [employer]. Referred to [facility], required form, clinicals and demographics faxed to [facility].

## 2018-01-01 DIAGNOSIS — T8131XD Disruption of external operation (surgical) wound, not elsewhere classified, subsequent encounter: Secondary | ICD-10-CM | POA: Diagnosis not present

## 2018-01-01 DIAGNOSIS — T8141XD Infection following a procedure, superficial incisional surgical site, subsequent encounter: Secondary | ICD-10-CM | POA: Diagnosis not present

## 2018-01-01 NOTE — Progress Notes (Signed)
DOS 12/25/17 Left heel wound debridement Incision and drainage left Repair tendon left

## 2018-01-02 ENCOUNTER — Ambulatory Visit: Payer: Medicare Other | Admitting: Podiatry

## 2018-01-02 DIAGNOSIS — T8131XD Disruption of external operation (surgical) wound, not elsewhere classified, subsequent encounter: Secondary | ICD-10-CM | POA: Diagnosis not present

## 2018-01-02 DIAGNOSIS — Z9889 Other specified postprocedural states: Secondary | ICD-10-CM

## 2018-01-02 DIAGNOSIS — T8141XD Infection following a procedure, superficial incisional surgical site, subsequent encounter: Secondary | ICD-10-CM | POA: Diagnosis not present

## 2018-01-07 DIAGNOSIS — T8141XD Infection following a procedure, superficial incisional surgical site, subsequent encounter: Secondary | ICD-10-CM | POA: Diagnosis not present

## 2018-01-07 DIAGNOSIS — T8131XD Disruption of external operation (surgical) wound, not elsewhere classified, subsequent encounter: Secondary | ICD-10-CM | POA: Diagnosis not present

## 2018-01-09 NOTE — Telephone Encounter (Signed)
[employer] Temporary 30 day supply of [name] has been supplied to pt.

## 2018-01-10 ENCOUNTER — Ambulatory Visit (INDEPENDENT_AMBULATORY_CARE_PROVIDER_SITE_OTHER): Payer: Medicare Other | Admitting: Podiatry

## 2018-01-10 DIAGNOSIS — M9262 Juvenile osteochondrosis of tarsus, left ankle: Secondary | ICD-10-CM

## 2018-01-12 NOTE — Progress Notes (Signed)
  Subjective:  Patient ID: TOLA MEAS, female    DOB: Aug 10, 1951,  MRN: [id number]  No chief complaint on file.   DOS: 11/20/2017 Procedure: Left gastrocnemius recession, calcaneus exostectomy with tendon debridement and detachment reattachment of the Achilles tendon with suture anchors  DOS: 12/25/17 Procedure: Repair of wound dehiscence.  67 y.o. female returns for post-op check. Pain controlled. Denies issues.  Objective:   General AA&O x3. Normal mood and affect.  Vascular Foot warm and well perfused. Edema noted.  Neurologic Gross sensation intact.  Dermatologic Gastrocnemius incision healed. Staples intact. Proximal half of Achilles incision healed. Staples intact.  Distal half with skin edges coapted with intact suture material. No purulence. No erythema.   Orthopedic: Tenderness to palpation noted about the surgical site. Tendon palpable without deficit.    Assessment & Plan:  Patient was evaluated and treated and all questions answered.  S/p left Gastrocnemius recession, Haglund's deformity resection with RTOR for repair of wound dehiscence. -Wound without signs of active infection today. -Continue Baxdela. -Will continue NWB until wound heals. -WTD dressing daily. -F/u in 1 week for recheck.   Return in about 1 week (around 01/09/2018) for Wound Care.

## 2018-01-12 NOTE — Progress Notes (Signed)
  Subjective:  Patient ID: [name], female    DOB: May 22, 1951,  MRN: [id number]  Chief Complaint  Patient presents with  . Routine Post Op     dos 03.06.2019 Gastrocnemius Recess Lt; Calcaneal Ostectomy (Posterior) Lt, " Im doing good, I havent needed any pain medication recently"    DOS: 11/20/17 Procedure: Lt Gastroc Recession, L Calcaneal Osteotomy with detachment/reattachment of Achilles tendon  67 y.o. female returns for post-op check. Denies N/V/F/Ch. Pain is controlled with current medications.  Objective:   General AA&O x3. Normal mood and affect.  Vascular Foot warm and well perfused.  Neurologic Gross sensation intact.  Dermatologic Skin healing well without signs of infection. Skin edges well coapted without signs of infection.  Orthopedic: Tenderness to palpation noted about the surgical site.    Assessment & Plan:  Patient was evaluated and treated and all questions answered.  S/p Gastroc Recession L, L Haglund's recession -Progressing as expected post-operatively. -Sutures: intact. -Medications refilled: none -Foot redressed.  Return in about 1 week (around 12/04/2017) for [name] patient, Post-op.

## 2018-01-12 NOTE — Progress Notes (Signed)
  Subjective:  Patient ID: [name], female    DOB: [birth year],  MRN: [id number]  No chief complaint on file.   DOS: 11/20/2017 Procedure: Left gastrocnemius recession, calcaneus exostectomy with tendon debridement and detachment reattachment of the Achilles tendon with suture anchors  DOS: 12/25/17 Procedure: Repair of wound dehiscence.  67 y.o. female returns for post-op check. Doing well. Believes the wound to be healing. Would like to start WB.  Objective:   General AA&O x3. Normal mood and affect.  Vascular Foot warm and well perfused. Edema noted.  Neurologic Gross sensation intact.  Dermatologic Gastrocnemius incision healed. Staples intact. Proximal half of Achilles incision healed. Staples intact. Distal incision improving. Skin coapted. No signs of continued infeciton.  Orthopedic: Tenderness to palpation noted about the surgical site. Tendon palpable without deficit.    Assessment & Plan:  Patient was evaluated and treated and all questions answered.  S/p left Gastrocnemius recession, Haglund's deformity resection with RTOR for repair of wound dehiscence. -Wound healing well. Staples removed from proximal apsect of incision. -Continue WTD dressings. -Continue PT -Will plan for progression to WB once wound heals.  Return in about 1 week (around 01/17/2018) for Post-op, Wound Care.

## 2018-01-15 DIAGNOSIS — T8131XD Disruption of external operation (surgical) wound, not elsewhere classified, subsequent encounter: Secondary | ICD-10-CM | POA: Diagnosis not present

## 2018-01-15 DIAGNOSIS — T8141XD Infection following a procedure, superficial incisional surgical site, subsequent encounter: Secondary | ICD-10-CM | POA: Diagnosis not present

## 2018-01-16 ENCOUNTER — Ambulatory Visit (INDEPENDENT_AMBULATORY_CARE_PROVIDER_SITE_OTHER): Payer: Medicare Other | Admitting: Podiatry

## 2018-01-16 DIAGNOSIS — Z9889 Other specified postprocedural states: Secondary | ICD-10-CM

## 2018-01-16 NOTE — Progress Notes (Signed)
  Subjective:  Patient ID: [name], female    DOB: 1951-05-08,  MRN: [id number]  Chief Complaint  Patient presents with  . Routine Post Op    check sutures    DOS: 11/20/2017 Procedure: Left gastrocnemius recession, calcaneus exostectomy with tendon debridement and detachment reattachment of the Achilles tendon with suture anchors  DOS: 12/25/17 Procedure: Repair of wound dehiscence.  67 y.o. female returns for post-op check. Doing well. Denies pain. Denies issues today.  Objective:   General AA&O x3. Normal mood and affect.  Vascular Foot warm and well perfused. Edema noted.  Neurologic Gross sensation intact.  Dermatologic Incision appears healed. Sutures intact.    Orthopedic: No tenderness to palpation noted about the surgical site. Tendon palpable without deficit.    Assessment & Plan:  Patient was evaluated and treated and all questions answered.  S/p left Gastrocnemius recession, Haglund's deformity resection with RTOR for repair of wound dehiscence. -Wound appears healed. Will leave sutures in an additional week. -Dry dressing today. Ok to shower and get wet. -Resume PT.  -Start protected WB in boot with walker assist.  Return in about 1 week (around 01/23/2018) for Post-op. Wound check.

## 2018-01-17 DIAGNOSIS — T8131XD Disruption of external operation (surgical) wound, not elsewhere classified, subsequent encounter: Secondary | ICD-10-CM | POA: Diagnosis not present

## 2018-01-17 DIAGNOSIS — T8141XD Infection following a procedure, superficial incisional surgical site, subsequent encounter: Secondary | ICD-10-CM | POA: Diagnosis not present

## 2018-01-22 DIAGNOSIS — T8131XD Disruption of external operation (surgical) wound, not elsewhere classified, subsequent encounter: Secondary | ICD-10-CM | POA: Diagnosis not present

## 2018-01-22 DIAGNOSIS — T8141XD Infection following a procedure, superficial incisional surgical site, subsequent encounter: Secondary | ICD-10-CM | POA: Diagnosis not present

## 2018-01-23 DIAGNOSIS — T8131XD Disruption of external operation (surgical) wound, not elsewhere classified, subsequent encounter: Secondary | ICD-10-CM | POA: Diagnosis not present

## 2018-01-23 DIAGNOSIS — T8141XD Infection following a procedure, superficial incisional surgical site, subsequent encounter: Secondary | ICD-10-CM | POA: Diagnosis not present

## 2018-01-24 ENCOUNTER — Ambulatory Visit (INDEPENDENT_AMBULATORY_CARE_PROVIDER_SITE_OTHER): Payer: Medicare Other | Admitting: Podiatry

## 2018-01-24 DIAGNOSIS — Z9889 Other specified postprocedural states: Secondary | ICD-10-CM

## 2018-01-29 DIAGNOSIS — T8141XD Infection following a procedure, superficial incisional surgical site, subsequent encounter: Secondary | ICD-10-CM | POA: Diagnosis not present

## 2018-01-29 DIAGNOSIS — T8131XD Disruption of external operation (surgical) wound, not elsewhere classified, subsequent encounter: Secondary | ICD-10-CM | POA: Diagnosis not present

## 2018-01-31 NOTE — Progress Notes (Signed)
  Subjective:  Patient ID: [name], female    DOB: 1950-09-18,  MRN: [id number]  Chief Complaint  Patient presents with  . Routine Post Op    wound check left    DOS: 11/20/2017 Procedure: Left gastrocnemius recession, calcaneus exostectomy with tendon debridement and detachment reattachment of the Achilles tendon with suture anchors  DOS: 12/25/17 Procedure: Repair of wound dehiscence.  67 y.o. female returns for post-op check. Doing well. Resumed PT. Ambulating in [place] with walker. Completed abx.  Objective:   General AA&O x3. Normal mood and affect.  Vascular Foot warm and well perfused. Edema noted.  Neurologic Gross sensation intact.  Dermatologic Incision appears healed.  Orthopedic: No tenderness to palpation noted about the surgical site. Tendon palpable without deficit.    Assessment & Plan:  Patient was evaluated and treated and all questions answered.  S/p left Gastrocnemius recession, Haglund's deformity resection with RTOR for repair of wound dehiscence. -Wound appears healed. Will leave sutures in an additional week. -Dry dressing today. Ok to shower and get wet. -Continue PT.  -Continue WBAT in CAM boot with walker assist. Slowly transition out of walker assist. Goal of WBAT in CAM boot only.  Return in about 2 weeks (around 02/07/2018).

## 2018-02-04 DIAGNOSIS — M1712 Unilateral primary osteoarthritis, left knee: Secondary | ICD-10-CM | POA: Diagnosis not present

## 2018-02-05 DIAGNOSIS — T8141XD Infection following a procedure, superficial incisional surgical site, subsequent encounter: Secondary | ICD-10-CM | POA: Diagnosis not present

## 2018-02-05 DIAGNOSIS — T8131XD Disruption of external operation (surgical) wound, not elsewhere classified, subsequent encounter: Secondary | ICD-10-CM | POA: Diagnosis not present

## 2018-02-07 ENCOUNTER — Encounter: Payer: Medicare Other | Admitting: Podiatry

## 2018-02-13 ENCOUNTER — Ambulatory Visit (INDEPENDENT_AMBULATORY_CARE_PROVIDER_SITE_OTHER): Payer: Medicare Other | Admitting: Podiatry

## 2018-02-13 DIAGNOSIS — Z9889 Other specified postprocedural states: Secondary | ICD-10-CM

## 2018-02-13 NOTE — Progress Notes (Signed)
  Subjective:  Patient ID: [name], female    DOB: 08/25/1951,  MRN: [id number]  Chief Complaint  Patient presents with  . Routine Post Op    left foot ulcer looks great -new grease burns lower legs -    DOS: 11/20/2017 Procedure: Left gastrocnemius recession, calcaneus exostectomy with tendon debridement and detachment reattachment of the Achilles tendon with suture anchors  DOS: 12/25/17 Procedure: Repair of wound dehiscence.  67 y.o. female returns for post-op check. Doing well. Completed PT. Ambulating in normal shoegear with minimal cane assist.  Objective:   General AA&O x3. Normal mood and affect.  Vascular Foot warm and well perfused. Edema noted.  Neurologic Gross sensation intact.  Dermatologic Incision appears healed.  Orthopedic: No tenderness to palpation noted about the surgical site. Tendon palpable without deficit. 4/5 Strength noted in PF   Assessment & Plan:  Patient was evaluated and treated and all questions answered.  S/p left Gastrocnemius recession, Haglund's deformity resection with RTOR for repair of wound dehiscence. -Wound healed. No signs of infection. -Continue WBAT in normal shoegear. -Advised to work on double heel raise but not for 2 weeks. Not necessary to perform single heel raise.  No follow-ups on file.

## 2018-03-13 ENCOUNTER — Encounter: Payer: Medicare Other | Admitting: Podiatry

## 2018-03-27 ENCOUNTER — Encounter: Payer: Self-pay | Admitting: Podiatry

## 2018-03-27 ENCOUNTER — Ambulatory Visit (INDEPENDENT_AMBULATORY_CARE_PROVIDER_SITE_OTHER): Payer: Medicare Other | Admitting: Podiatry

## 2018-03-27 DIAGNOSIS — M1711 Unilateral primary osteoarthritis, right knee: Secondary | ICD-10-CM | POA: Diagnosis not present

## 2018-03-27 DIAGNOSIS — M7662 Achilles tendinitis, left leg: Secondary | ICD-10-CM

## 2018-04-02 ENCOUNTER — Telehealth: Payer: Self-pay | Admitting: Physician Assistant

## 2018-04-02 ENCOUNTER — Telehealth: Payer: Self-pay | Admitting: Podiatry

## 2018-04-02 DIAGNOSIS — Z1231 Encounter for screening mammogram for malignant neoplasm of breast: Secondary | ICD-10-CM | POA: Diagnosis not present

## 2018-04-02 LAB — HM MAMMOGRAPHY

## 2018-04-02 MED ORDER — AMOXICILLIN 500 MG PO CAPS: ORAL_CAPSULE | ORAL | 0 refills | Status: DC

## 2018-04-02 NOTE — Telephone Encounter (Signed)
Received a phone call from [facility] stating that patient had a foot procedure in May 2019 and was told that before any additional surgeries post foot surgery patient will need prophylactic treatment. Dental office would like to know if you can call in and antibiotic. Patient has dental appointment today. Please advise?

## 2018-04-02 NOTE — Telephone Encounter (Signed)
I informed [facility], pt had surgery 11/21/2107 and would need to be premedicated for dental procedures for at least 6 months post-op. Ivin Booty requested note to be faxed to their office. Note with above information faxed to [facility].

## 2018-04-02 NOTE — Telephone Encounter (Signed)
Regimen for dosing is---- amoxicillin 2 g p.o. x1 dose to be administered 30 to 60 minutes prior to procedure  Send prescription for amoxicillin 500 mg --- Take 4 for one single dose--- dispense #4+0.

## 2018-04-02 NOTE — Telephone Encounter (Signed)
[facility] and informed them that medications were sent into pharmacy. The stated that they will contact patient and let her know medications were sent to the pharmacy.

## 2018-04-02 NOTE — Telephone Encounter (Signed)
[facility] called to find out if pt. Needs to premedicate before any procedures/ and if she needs to wait. Please call Ivin Booty, [phone]. Pt is currently on the way to the office.

## 2018-04-04 DIAGNOSIS — M1712 Unilateral primary osteoarthritis, left knee: Secondary | ICD-10-CM | POA: Diagnosis not present

## 2018-04-04 DIAGNOSIS — M1711 Unilateral primary osteoarthritis, right knee: Secondary | ICD-10-CM | POA: Diagnosis not present

## 2018-04-08 NOTE — Progress Notes (Signed)
  Subjective:  Patient ID: [name], female    DOB: [birth year],  MRN: [id number]  Chief Complaint  Patient presents with  . Routine Post Op    left foot follow up; pt stated, "doing good, still little sore but scar is healing; no new concerns"   DOS: 11/20/2017 Procedure: Left gastrocnemius recession, calcaneus exostectomy with tendon debridement and detachment reattachment of the Achilles tendon with suture anchors  DOS: 12/25/17 Procedure: Repair of wound dehiscence.  67 y.o. female returns for post-op check.  Doing well and wearing normal shoe gear.  Still has a little soreness but the scar is healing well.  Using a walker but is for her knee  Objective:   General AA&O x3. Normal mood and affect.  Vascular Foot warm and well perfused. Edema noted.  Neurologic Gross sensation intact.  Dermatologic Incision appears healed.  Orthopedic: No tenderness to palpation noted about the surgical site. Tendon palpable without deficit. 4/5 Strength noted in PF   Assessment & Plan:  Patient was evaluated and treated and all questions answered.  S/p left Gastrocnemius recession, Haglund's deformity resection with RTOR for repair of wound dehiscence. -Wound healed. No signs of infection. -Continue WBAT in normal shoegear.  Return in about 3 months (around 06/27/2018).

## 2018-04-10 ENCOUNTER — Other Ambulatory Visit: Payer: Self-pay | Admitting: Physician Assistant

## 2018-04-11 DIAGNOSIS — M1711 Unilateral primary osteoarthritis, right knee: Secondary | ICD-10-CM | POA: Diagnosis not present

## 2018-04-14 ENCOUNTER — Other Ambulatory Visit: Payer: Self-pay

## 2018-04-14 NOTE — Telephone Encounter (Signed)
Patient called and left a message stating she has a cleaning and 1 filling scheduled and she  is asking for a refill. Pls advise

## 2018-04-14 NOTE — Telephone Encounter (Signed)
If the cleaning and the filling are scheduled on separate days--- then  can go ahead and send in prescription for #4+ 1 refill.  That way she can take the dose of 4 on both occasions.

## 2018-04-15 MED ORDER — AMOXICILLIN 500 MG PO CAPS: ORAL_CAPSULE | ORAL | 0 refills | Status: DC

## 2018-04-15 NOTE — Telephone Encounter (Signed)
Call placed to patient she has both appointments scheduled for 8/5. Patient is aware I will send in a prescription for single dose to take before the procedure

## 2018-04-16 ENCOUNTER — Ambulatory Visit: Payer: Medicare Other | Admitting: Family Medicine

## 2018-04-19 IMAGING — MR MR ANKLE*L* W/O CM
5 of 7 series · 31 of 40 positions shown · non-contrast
Comparison: None.

CLINICAL DATA: Posterior left ankle pain. Question Achilles tendon
tear.

EXAM:
MRI OF THE LEFT ANKLE WITHOUT CONTRAST
TECHNIQUE: Multiplanar, multisequence MR imaging of the ankle was performed. No
intravenous contrast was administered.

[Series 4: T2 fat-sat · axial · 3.0mm · 0.53mm/px · z∈[-111,+20]mm · 7 of 35 slices shown (1 of 3)]
[im 1/35]
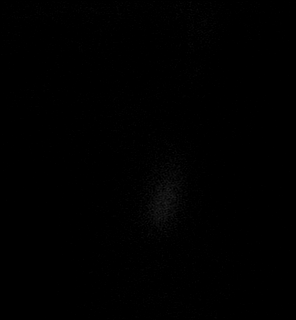
[im 6/35]
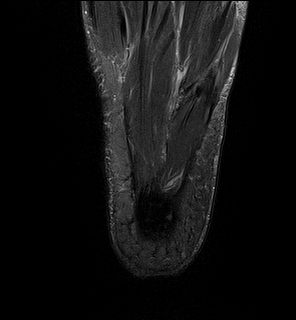
[im 12/35]
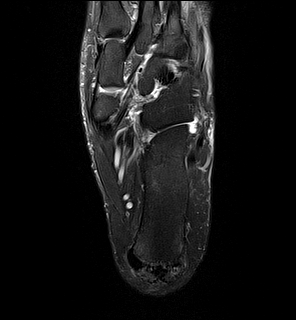
[im 18/35]
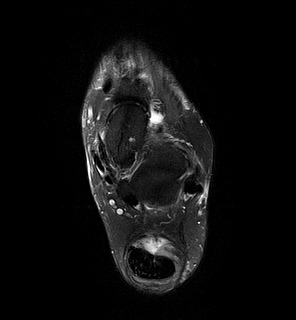
[im 23/35]
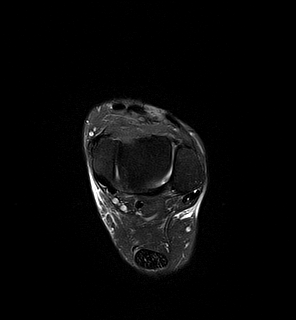
[im 29/35]
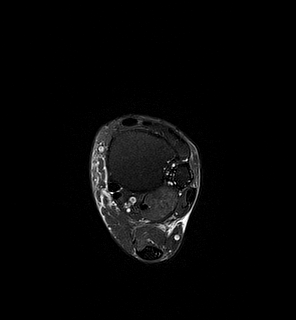
[im 35/35]
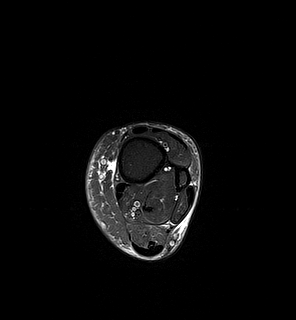

[Series 5: PD fat-sat · axial · 3.0mm · 0.44mm/px · z∈[-111,+21]mm · 7 of 35 slices shown]
[im 1/35]
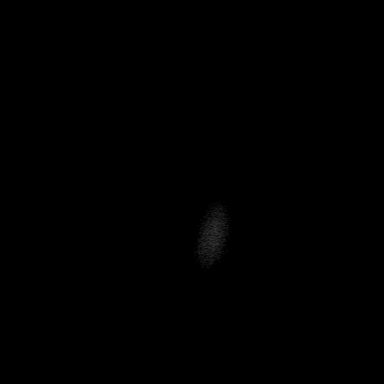
[im 6/35]
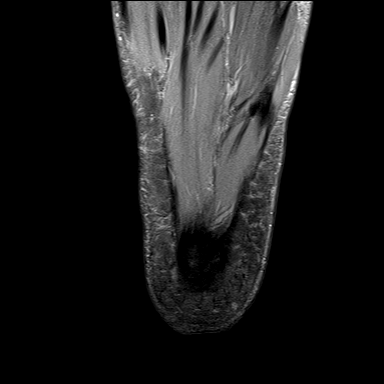
[im 12/35]
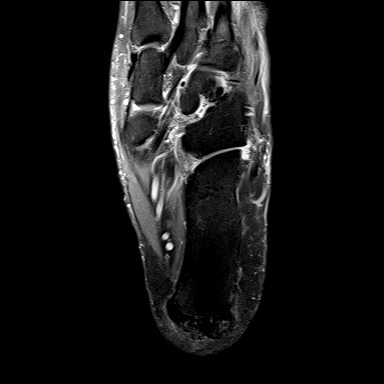
[im 18/35]
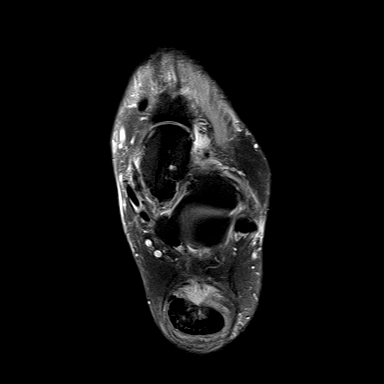
[im 23/35]
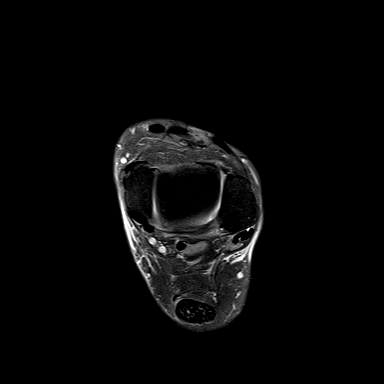
[im 29/35]
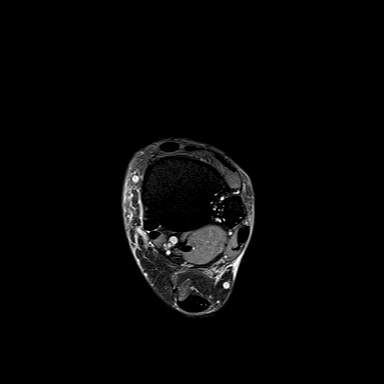
[im 35/35]
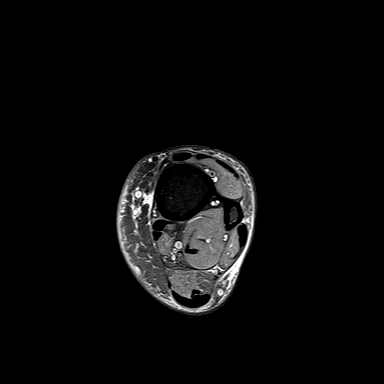

[Series 6: T1 · axial · 3.0mm · 0.27mm/px · z∈[-112,-4]mm · 6 of 35 slices shown]
[im 1/35]
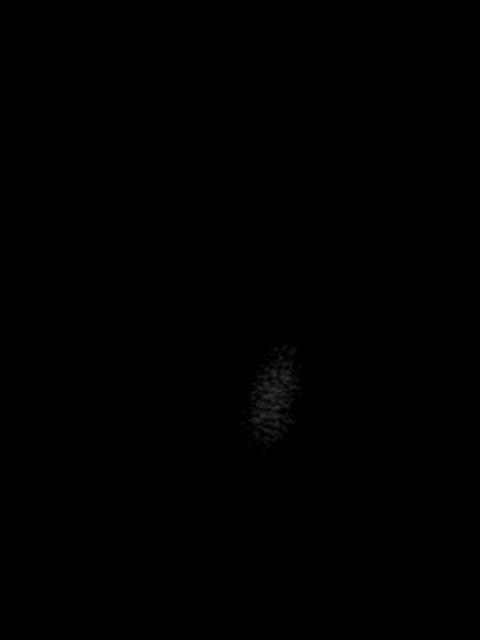
[im 6/35]
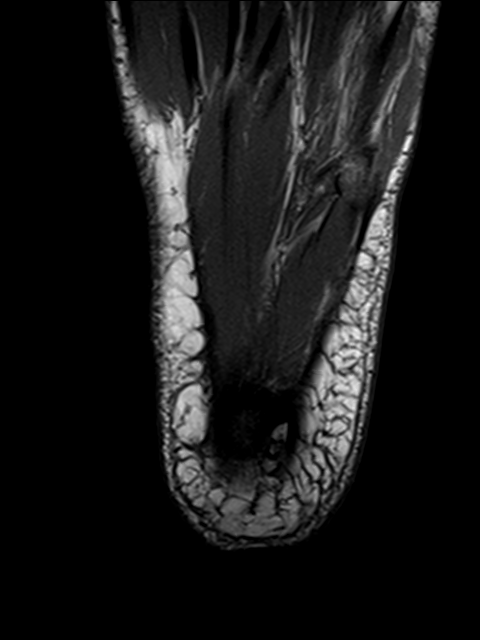
[im 12/35]
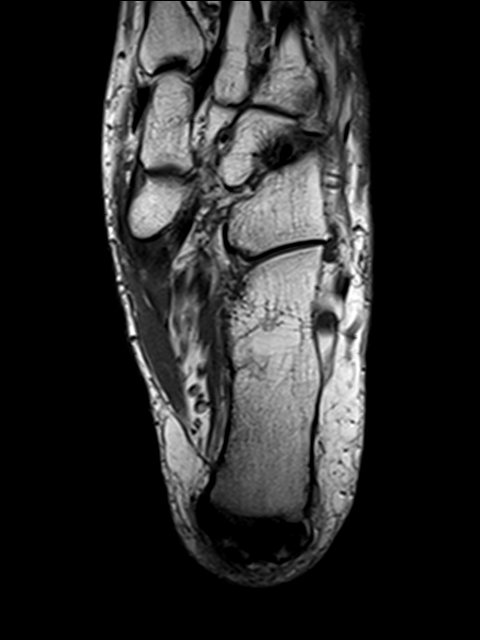
[im 18/35]
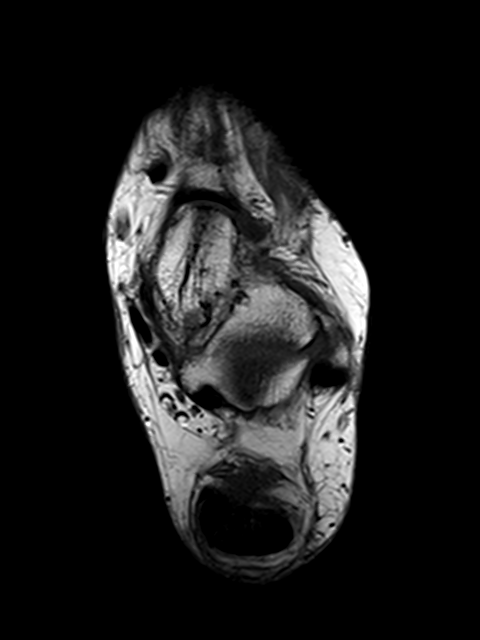
[im 23/35]
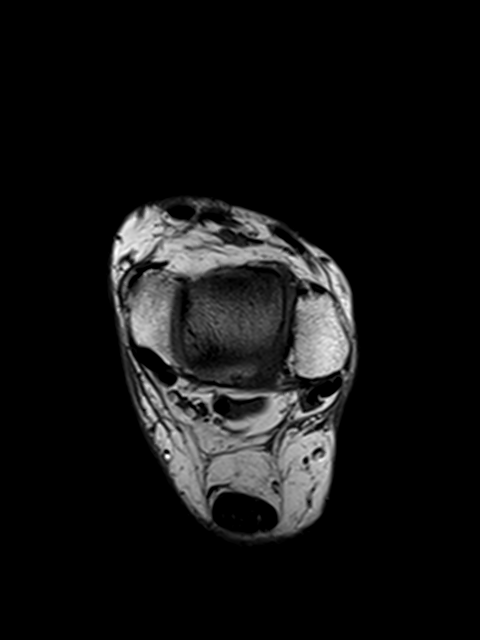
[im 29/35]
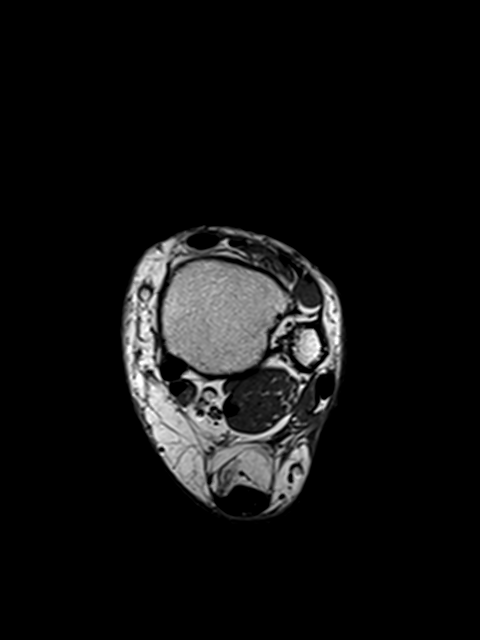

[Series 8: T2 fat-sat · sagittal · 3.0mm · 0.56mm/px · 4 of 23 slices shown (2 of 3)]
[im 1/23]
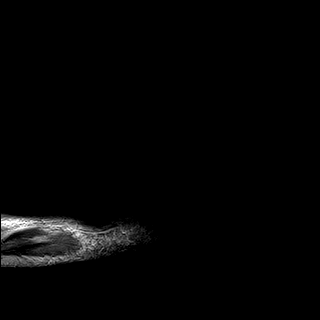
[im 8/23]
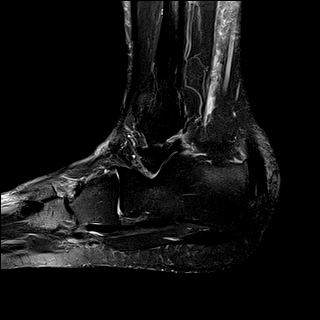
[im 15/23]
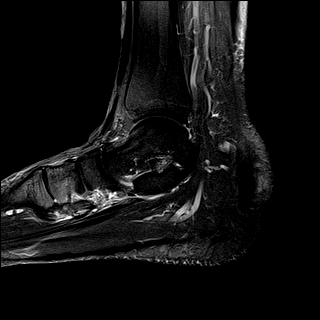
[im 23/23]
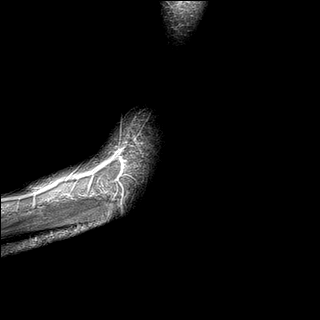

[Series 10: T2 fat-sat · coronal · 3.5mm · 0.47mm/px · 7 of 36 slices shown (3 of 3)]
[im 1/36]
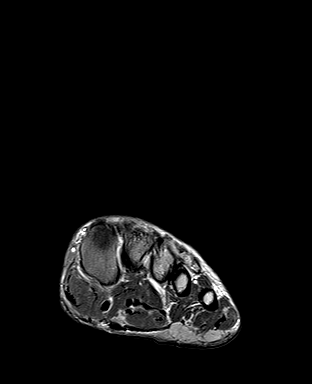
[im 6/36]
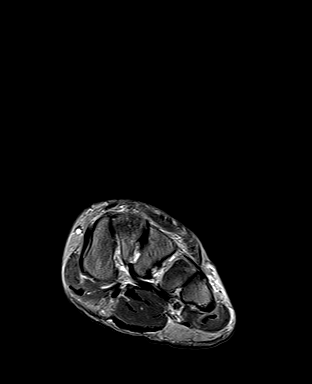
[im 12/36]
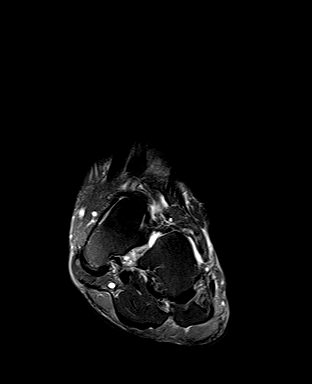
[im 18/36]
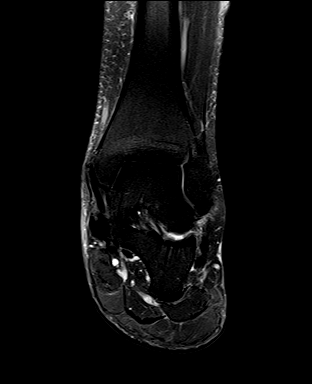
[im 24/36]
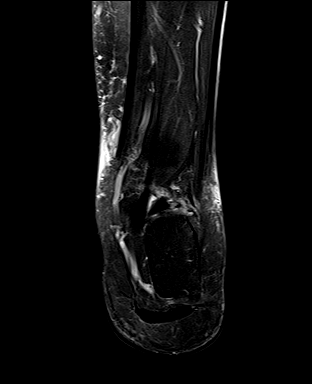
[im 30/36]
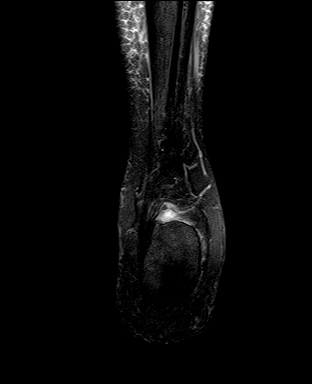
[im 36/36]
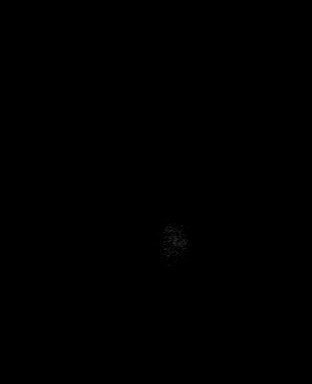

[31 of 40 positions shown; findings below may reference images not displayed]

FINDINGS: TENDONS

Peroneal: Intact.

Posteromedial: Intact.

Anterior: Intact.

Achilles: Intrasubstance increased T2 signal and marked thickening
are seen in the distal 7 cm of the Achilles tendon. Small
calcification in the distal tendon is identified. There is reactive
marrow edema in a prominent dorsal process of the calcaneus and
fluid in the retrocalcaneal bursa. The Achilles is intact.

Plantar Fascia: Intact with normal signal. Small calcifications in
the proximal medial cord noted. Plantar calcaneal spur is seen.

LIGAMENTS

Lateral: Intact.

Medial: Intact.

CARTILAGE

Ankle Joint: Normal.

Subtalar Joints/Sinus Tarsi: Normal.

Bones: No fracture or worrisome lesion.

Other: None.
IMPRESSION: Marked insertional Achilles tendinopathy with associated
retrocalcaneal bursitis and reactive marrow edema in a prominent
dorsal process of the calcaneus all consistent with Haglund's
deformity. The Achilles is intact.

## 2018-04-23 ENCOUNTER — Ambulatory Visit: Payer: BLUE CROSS/BLUE SHIELD | Admitting: Physician Assistant

## 2018-04-28 NOTE — Telephone Encounter (Signed)
spoke to patient, she is doing much better after loosening the ace bandage. Advised ok to take a whole pain pill if needed, every 6 hours per Dr. March Rummage. Reassured patient and advised to please call our office with any questions.     By Wardell Heath, [place]

## 2018-04-30 ENCOUNTER — Encounter: Payer: Self-pay | Admitting: Physician Assistant

## 2018-04-30 ENCOUNTER — Other Ambulatory Visit: Payer: Self-pay

## 2018-04-30 ENCOUNTER — Ambulatory Visit (INDEPENDENT_AMBULATORY_CARE_PROVIDER_SITE_OTHER): Payer: Medicare Other | Admitting: Physician Assistant

## 2018-04-30 VITALS — BP 126/84 | HR 51 | Temp 97.6°F | Resp 18 | Ht 67.5 in | Wt 209.0 lb

## 2018-04-30 DIAGNOSIS — I10 Essential (primary) hypertension: Secondary | ICD-10-CM | POA: Diagnosis not present

## 2018-04-30 DIAGNOSIS — E785 Hyperlipidemia, unspecified: Secondary | ICD-10-CM | POA: Diagnosis not present

## 2018-04-30 DIAGNOSIS — Z0000 Encounter for general adult medical examination without abnormal findings: Secondary | ICD-10-CM | POA: Diagnosis not present

## 2018-04-30 NOTE — Progress Notes (Signed)
Subjective:   Patient presents for Medicare Annual/Subsequent preventive examination.   Review Past Medical/Family/Social: This information is reviewed today.  See epic tabs for information.  Risk Factors  Current exercise habits: She recently had surgery to her left heel.  Also  having right knee pain.  Therefore very limited activity level. Dietary issues discussed: Discussed low sodium, low-cholesterol, low carbohydrate diet.  Cardiac risk factors: Obesity (BMI >= 30 kg/m2).  hypertension, hyperlipidemia.  Also family history mother had MI at age 18.  Depression Screen  (Note: if answer to either of the following is "Yes", a more complete depression screening is indicated)  Over the past two weeks, have you felt down, depressed or hopeless? No Over the past two weeks, have you felt little interest or pleasure in doing things? No Have you lost interest or pleasure in daily life? No Do you often feel hopeless? No Do you cry easily over simple problems? No   Activities of Daily Living  In your present state of health, do you have any difficulty performing the following activities?:  Driving? No  Managing money? No  Feeding yourself? No  Getting from bed to chair? No  Climbing a flight of stairs? Yes---- She is having difficulty climbing a flight of stairs currently because she recently had surgery to her left heel by podiatry.  Also has right knee pain.  She is seeing orthopedics Dr. ------------------------------------Maureen Ralphs and Amber his PA.  They are treating/managing this. Preparing food and eating?: No  Bathing or showering? No  Getting dressed: No  Getting to the toilet? No  Using the toilet:No  Moving around from place to place: yes----- She is having difficulty climbing a flight of stairs currently because she recently had surgery to her left heel by podiatry.  Also has right knee pain.  She is seeing orthopedics Dr. ------------------------------------Maureen Ralphs and Amber his PA.   They are treating/managing this. In the past year have you fallen or had a near fall?:No  Are you sexually active? No  Do you have more than one partner? No   Hearing Difficulties: No  Do you often ask people to speak up or repeat themselves? No  Do you experience ringing or noises in your ears? No Do you have difficulty understanding soft or whispered voices? No  Do you feel that you have a problem with memory? No Do you often misplace items? No  Do you feel safe at home? Yes  Cognitive Testing  Alert? Yes Normal Appearance?Yes  Oriented to person? Yes Place? Yes  Time? Yes  Recall of three objects? Yes  Can perform simple calculations? Yes  Displays appropriate judgment?Yes  Can read the correct time from a watch face?Yes   List the Names of Other Physician/Practitioners you currently use:  Podiatry Dr. Maureen [facility]s at [facility] Kizzie Bane   Indicate any recent Medical Services you may have received from other than [facility] providers in the past year (date may be approximate).  Podiatry Dr. Maureen Ralphs at [facility]ist  Screening Tests / Date  Colonoscopy   Last colonoscopy was with Dr. Maurene Capes 11/09/2014.  Per patient repeat 5 years.                   Zostavax ----she received Zostavax January 2015 Mammogram ----she had mammogram 03/27/2017- Tetanus/tdap--- she received Tdap 04/2011    Assessment:    Annual wellness medicare exam   Plan:    During the course of the visit the patient was  educated and counseled about appropriate screening and preventive services.  Medicare Attestation  I have personally reviewed:  The patient's medical and social history  Their use of alcohol, tobacco or illicit drugs  Their current medications and supplements  The patient's functional ability including ADLs,fall risks, home safety risks, cognitive, and hearing and visual impairment  Diet and physical activities  No evidence for depression or mood disorders  The  patient's weight, height, BMI have been recorded in the chart.    1. Medicare annual wellness visit, subsequent  2. Essential hypertension Blood pressure is at goal/controlled.  Continue current medications.  Check lab to monitor. - COMPLETE METABOLIC PANEL WITH GFR  3. Hyperlipidemia, unspecified hyperlipidemia type She is taking simvastatin.  She is fasting.  Recheck FLP LFT to monitor. - COMPLETE METABOLIC PANEL WITH GFR - Lipid panel    For other preventive care see her CPE note 10/23/2017.

## 2018-05-01 LAB — COMPLETE METABOLIC PANEL WITH GFR
AG RATIO: 2 (calc) (ref 1.0–2.5)
ALKALINE PHOSPHATASE (APISO): 50 U/L (ref 33–130)
ALT: 11 U/L (ref 6–29)
AST: 14 U/L (ref 10–35)
Albumin: 3.9 g/dL (ref 3.6–5.1)
BUN: 10 mg/dL (ref 7–25)
CHLORIDE: 104 mmol/L (ref 98–110)
CO2: 28 mmol/L (ref 20–32)
Calcium: 9.2 mg/dL (ref 8.6–10.4)
Creat: 0.7 mg/dL (ref 0.50–0.99)
GFR, Est African American: 105 mL/min/{1.73_m2} (ref >=60)
GFR, Est Non African American: 91 mL/min/{1.73_m2} (ref >=60)
GLOBULIN: 2 g/dL (ref 1.9–3.7)
Glucose, Bld: 87 mg/dL (ref 65–99)
POTASSIUM: 4.1 mmol/L (ref 3.5–5.3)
SODIUM: 144 mmol/L (ref 135–146)
Total Bilirubin: 0.4 mg/dL (ref 0.2–1.2)
Total Protein: 5.9 g/dL — ABNORMAL LOW (ref 6.1–8.1)

## 2018-05-01 LAB — LIPID PANEL
CHOLESTEROL: 157 mg/dL (ref <200)
HDL: 57 mg/dL (ref >50)
LDL Cholesterol (Calc): 82 mg/dL (calc)
Non-HDL Cholesterol (Calc): 100 mg/dL (calc) (ref <130)
Total CHOL/HDL Ratio: 2.8 (calc) (ref <5.0)
Triglycerides: 87 mg/dL (ref <150)

## 2018-05-10 ENCOUNTER — Other Ambulatory Visit: Payer: Self-pay | Admitting: Physician Assistant

## 2018-06-24 ENCOUNTER — Ambulatory Visit (INDEPENDENT_AMBULATORY_CARE_PROVIDER_SITE_OTHER): Payer: Medicare Other

## 2018-06-24 DIAGNOSIS — Z23 Encounter for immunization: Secondary | ICD-10-CM

## 2018-06-24 NOTE — Progress Notes (Signed)
Patient was in office for flu vaccine.Patient received high dose flu vaccine in her left deltoid.Patient tolerated well

## 2018-06-26 ENCOUNTER — Ambulatory Visit (INDEPENDENT_AMBULATORY_CARE_PROVIDER_SITE_OTHER): Payer: Medicare Other | Admitting: Podiatry

## 2018-06-26 ENCOUNTER — Encounter: Payer: Self-pay | Admitting: Podiatry

## 2018-06-26 VITALS — BP 112/72 | HR 63 | Temp 96.2°F | Resp 15

## 2018-06-26 DIAGNOSIS — L905 Scar conditions and fibrosis of skin: Secondary | ICD-10-CM | POA: Diagnosis not present

## 2018-06-26 DIAGNOSIS — R52 Pain, unspecified: Secondary | ICD-10-CM | POA: Diagnosis not present

## 2018-06-26 DIAGNOSIS — M659 Synovitis and tenosynovitis, unspecified: Secondary | ICD-10-CM | POA: Diagnosis not present

## 2018-06-26 DIAGNOSIS — M7662 Achilles tendinitis, left leg: Secondary | ICD-10-CM | POA: Diagnosis not present

## 2018-06-26 DIAGNOSIS — M722 Plantar fascial fibromatosis: Secondary | ICD-10-CM | POA: Diagnosis not present

## 2018-06-26 DIAGNOSIS — G5762 Lesion of plantar nerve, left lower limb: Secondary | ICD-10-CM | POA: Diagnosis not present

## 2018-06-26 MED ORDER — MELOXICAM 15 MG PO TABS: 15.0000 mg | ORAL_TABLET | Freq: Every day | ORAL | 0 refills | Status: DC

## 2018-06-26 NOTE — Patient Instructions (Signed)

## 2018-06-26 NOTE — Progress Notes (Signed)
Subjective:  Patient ID: [name], female    DOB: [birth year],  MRN: [id number]  Chief Complaint  Patient presents with  . Routine Post Op    Pt. stated," some improvement, but it's still numb, stiff, tight, and painful; 10/10 sharp pain after walking." Tx: icing and ibuprofen -pt denies N/v/F/C/Ch    68 y.o. female presents with the above complaint.  Underwent gastrocnemius recession, Haglund's exostectomy 01/19/6567 complicated by wound dehiscence which was repaired.  Reports numbness in the first and second toes, states the ankle feels stiff and is sometimes painful.  Has more pain on the bottom than pain in the back of the heel.   Review of Systems: Negative except as noted in the HPI. Denies N/V/F/Ch.  Past Medical History:  Diagnosis Date  . Anxiety   . Arthritis    knees   . GERD (gastroesophageal reflux disease)   . Heart murmur   . Hyperlipidemia   . Hypertension   . Microscopic hematuria   . Obesity     Current Outpatient Medications:  .  aspirin 81 MG tablet, Take 1 tablet (81 mg total) by mouth at bedtime., Disp: 30 tablet, Rfl: 1 .  calcium carbonate (OS-CAL) 600 MG TABS tablet, Take 1 tablet (600 mg total) by mouth daily., Disp: 60 tablet, Rfl: 1 .  CALCIUM PO, Take by mouth., Disp: , Rfl:  .  cholecalciferol (VITAMIN D) 1000 units tablet, Take 1 tablet (1,000 Units total) by mouth daily., Disp: 30 tablet, Rfl: 1 .  fish oil-omega-3 fatty acids 1000 MG capsule, Take 2 g by mouth daily. , Disp: , Rfl:  .  hydrochlorothiazide (HYDRODIURIL) 25 MG tablet, TAKE 1 TABLET(25 MG) BY MOUTH DAILY, Disp: 90 tablet, Rfl: 0 .  ibuprofen (ADVIL,MOTRIN) 200 MG tablet, Take 600 mg by mouth every 6 (six) hours as needed. For pain, Disp: , Rfl:  .  Multiple Vitamin (MULTIVITAMIN) tablet, Take 1 tablet by mouth daily., Disp: 30 tablet, Rfl: 1 .  ranitidine (ZANTAC) 150 MG capsule, Take 1 capsule (150 mg total) by mouth daily., Disp: 30 capsule, Rfl: 1 .  simvastatin (ZOCOR) 10 MG  tablet, TAKE 1 TABLET BY MOUTH EVERY DAY AT 6 PM, Disp: 90 tablet, Rfl: 0 .  meloxicam (MOBIC) 15 MG tablet, Take 1 tablet (15 mg total) by mouth daily., Disp: 30 tablet, Rfl: 0  Social History   Tobacco Use  Smoking Status Former Smoker  Smokeless Tobacco Never Used    No Known Allergies Objective:   Vitals:   06/26/18 1403  BP: 112/72  Pulse: 63  Resp: 15  Temp: (!) 96.2 F (35.7 C)   There is no height or weight on file to calculate BMI. Constitutional Well developed. Well nourished.  Vascular Dorsalis pedis pulses palpable bilaterally. Posterior tibial pulses palpable bilaterally. Capillary refill normal to all digits.  No cyanosis or clubbing noted. Pedal hair growth normal.  Neurologic Normal speech. Oriented to person, place, and time. Epicritic sensation to light touch grossly present bilaterally. Paresthesia left first second toes  Dermatologic Nails well groomed and normal in appearance. No open wounds. Slight hypertrophic scar posterior left ankle  Orthopedic: Normal joint ROM without pain or crepitus bilaterally. No visible deformities. Pain palpation posterior calcaneus Achilles tendon insertion Pain palpation of the medial calcaneal tuber Pain to palpation first interspace   Radiographs: None today Assessment:   1. Plantar fasciitis   2. Plantar neuroma, left   3. Tendonitis, Achilles, left   4. Synovitis   5.  Painful scar    Plan:  Patient was evaluated and treated and all questions answered.  Plantar fasciitis left -Injection delivered as below  Procedure: Injection Tendon/Ligament Consent: Verbal consent obtained. Location: Left plantar fascia at the glabrous junction; medial approach. Skin Prep: Alcohol. Injectate: 1 cc 0.5% marcaine plain, 1 cc dexamethasone phosphate, 0.5 cc kenalog 10. Disposition: Patient tolerated procedure well. Injection site dressed with a band-aid.  Neuroma first interspace -Advised this is outside of the  surgical field and likely neuroma -Injection delivered as below  Procedure: Neuroma Injection Location: Left 1st interspace Skin Prep: Alcohol. Injectate: 0.5 cc 0.5% marcaine plain, 0.5 cc dexamethasone phosphate. Disposition: Patient tolerated procedure well. Injection site dressed with a band-aid.  Achilles tendinitis status post Haglund's resection, painful scar -We will consider injection at next visit  Return in about 3 weeks (around 07/17/2018) for Plantar fasciitis, Left.  Needs XR of the left heel including calcaneal axial view

## 2018-07-07 ENCOUNTER — Ambulatory Visit (INDEPENDENT_AMBULATORY_CARE_PROVIDER_SITE_OTHER): Payer: Medicare Other

## 2018-07-07 DIAGNOSIS — Z23 Encounter for immunization: Secondary | ICD-10-CM

## 2018-07-07 NOTE — Progress Notes (Signed)
Patient was in office for prevnar 13 vaccine. Patient received vaccine in her right deltoid.Patient tolerated well

## 2018-07-24 ENCOUNTER — Ambulatory Visit (INDEPENDENT_AMBULATORY_CARE_PROVIDER_SITE_OTHER): Payer: Medicare Other | Admitting: Podiatry

## 2018-07-24 DIAGNOSIS — M659 Synovitis and tenosynovitis, unspecified: Secondary | ICD-10-CM

## 2018-07-24 DIAGNOSIS — M7662 Achilles tendinitis, left leg: Secondary | ICD-10-CM | POA: Diagnosis not present

## 2018-07-24 DIAGNOSIS — M722 Plantar fascial fibromatosis: Secondary | ICD-10-CM | POA: Diagnosis not present

## 2018-07-24 DIAGNOSIS — G5762 Lesion of plantar nerve, left lower limb: Secondary | ICD-10-CM | POA: Diagnosis not present

## 2018-07-24 DIAGNOSIS — R52 Pain, unspecified: Secondary | ICD-10-CM

## 2018-07-24 DIAGNOSIS — L905 Scar conditions and fibrosis of skin: Secondary | ICD-10-CM

## 2018-07-24 MED ORDER — MELOXICAM 15 MG PO TABS: 15.0000 mg | ORAL_TABLET | Freq: Every day | ORAL | 1 refills | Status: DC

## 2018-07-24 NOTE — Progress Notes (Signed)
Subjective:  Patient ID: [name], female    DOB: 1950-11-13,  MRN: [id number]  Chief Complaint  Patient presents with  . Plantar Fasciitis    Left - doing much better    67 y.o. female presents with the above complaint.  Doing much better pain is resolved. Doing heel exercises and not having pain. Still having numbness in the first and second toes however.  Review of Systems: Negative except as noted in the HPI. Denies N/V/F/Ch.  Past Medical History:  Diagnosis Date  . Anxiety   . Arthritis    knees   . GERD (gastroesophageal reflux disease)   . Heart murmur   . Hyperlipidemia   . Hypertension   . Microscopic hematuria   . Obesity     Current Outpatient Medications:  .  aspirin 81 MG tablet, Take 1 tablet (81 mg total) by mouth at bedtime., Disp: 30 tablet, Rfl: 1 .  calcium carbonate (OS-CAL) 600 MG TABS tablet, Take 1 tablet (600 mg total) by mouth daily., Disp: 60 tablet, Rfl: 1 .  CALCIUM PO, Take by mouth., Disp: , Rfl:  .  cholecalciferol (VITAMIN D) 1000 units tablet, Take 1 tablet (1,000 Units total) by mouth daily., Disp: 30 tablet, Rfl: 1 .  fish oil-omega-3 fatty acids 1000 MG capsule, Take 2 g by mouth daily. , Disp: , Rfl:  .  hydrochlorothiazide (HYDRODIURIL) 25 MG tablet, TAKE 1 TABLET(25 MG) BY MOUTH DAILY, Disp: 90 tablet, Rfl: 0 .  ibuprofen (ADVIL,MOTRIN) 200 MG tablet, Take 600 mg by mouth every 6 (six) hours as needed. For pain, Disp: , Rfl:  .  meloxicam (MOBIC) 15 MG tablet, Take 1 tablet (15 mg total) by mouth daily., Disp: 90 tablet, Rfl: 1 .  Multiple Vitamin (MULTIVITAMIN) tablet, Take 1 tablet by mouth daily., Disp: 30 tablet, Rfl: 1 .  ranitidine (ZANTAC) 150 MG capsule, Take 1 capsule (150 mg total) by mouth daily., Disp: 30 capsule, Rfl: 1 .  simvastatin (ZOCOR) 10 MG tablet, TAKE 1 TABLET BY MOUTH EVERY DAY AT 6 PM, Disp: 90 tablet, Rfl: 0  Social History   Tobacco Use  Smoking Status Former Smoker  Smokeless Tobacco Never Used     No Known Allergies Objective:   There were no vitals filed for this visit. There is no height or weight on file to calculate BMI. Constitutional Well developed. Well nourished.  Vascular Dorsalis pedis pulses palpable bilaterally. Posterior tibial pulses palpable bilaterally. Capillary refill normal to all digits.  No cyanosis or clubbing noted. Pedal hair growth normal.  Neurologic Normal speech. Oriented to person, place, and time. Epicritic sensation to light touch grossly present bilaterally. Paresthesia left first second toes  Dermatologic Nails well groomed and normal in appearance. No open wounds. Slight hypertrophic scar posterior left ankle  Orthopedic: Normal joint ROM without pain or crepitus bilaterally. No visible deformities. No pain left posterior heel. Pain to palpation first interspace   Radiographs: None today Assessment:   1. Plantar fasciitis   2. Plantar neuroma, left   3. Tendonitis, Achilles, left   4. Synovitis   5. Painful scar   6. Achilles tendinitis of left lower extremity    Plan:  Patient was evaluated and treated and all questions answered.  Plantar fasciitis left -Improved no pain today. -Continue stretching and icing.  Neuroma first interspace -Advised this is outside of the surgical field and likely neuroma -Injection #2 delivered as below  Procedure: Neuroma Injection Location: Left 2nd interspace Skin Prep: Alcohol.  Injectate: 0.5 cc 0.5% marcaine plain, 0.5 cc dexamethasone phosphate. Disposition: Patient tolerated procedure well. Injection site dressed with a band-aid.  Return if symptoms worsen or fail to improve.

## 2018-08-11 ENCOUNTER — Other Ambulatory Visit: Payer: Self-pay | Admitting: Family Medicine

## 2018-08-11 MED ORDER — SIMVASTATIN 10 MG PO TABS: ORAL_TABLET | ORAL | 1 refills | Status: DC

## 2018-08-11 MED ORDER — HYDROCHLOROTHIAZIDE 25 MG PO TABS: ORAL_TABLET | ORAL | 3 refills | Status: DC

## 2018-08-29 DIAGNOSIS — H2513 Age-related nuclear cataract, bilateral: Secondary | ICD-10-CM | POA: Diagnosis not present

## 2018-10-30 ENCOUNTER — Ambulatory Visit: Payer: Medicare Other | Admitting: Family Medicine

## 2018-10-30 ENCOUNTER — Ambulatory Visit: Payer: Medicare Other | Admitting: Physician Assistant

## 2018-11-13 DIAGNOSIS — M25561 Pain in right knee: Secondary | ICD-10-CM | POA: Diagnosis not present

## 2018-11-13 DIAGNOSIS — M1711 Unilateral primary osteoarthritis, right knee: Secondary | ICD-10-CM | POA: Diagnosis not present

## 2018-11-20 DIAGNOSIS — M25561 Pain in right knee: Secondary | ICD-10-CM | POA: Diagnosis not present

## 2018-11-20 DIAGNOSIS — M1711 Unilateral primary osteoarthritis, right knee: Secondary | ICD-10-CM | POA: Diagnosis not present

## 2018-11-25 ENCOUNTER — Ambulatory Visit (INDEPENDENT_AMBULATORY_CARE_PROVIDER_SITE_OTHER): Payer: Medicare Other | Admitting: Family Medicine

## 2018-11-25 ENCOUNTER — Encounter: Payer: Self-pay | Admitting: Family Medicine

## 2018-11-25 VITALS — BP 136/80 | HR 72 | Temp 98.0°F | Resp 14 | Ht 68.5 in | Wt 217.0 lb

## 2018-11-25 DIAGNOSIS — Z01818 Encounter for other preprocedural examination: Secondary | ICD-10-CM | POA: Diagnosis not present

## 2018-11-25 DIAGNOSIS — I10 Essential (primary) hypertension: Secondary | ICD-10-CM | POA: Diagnosis not present

## 2018-11-25 NOTE — Progress Notes (Signed)
Subjective:    Patient ID: [name], female    DOB: [birth year], 68 y.o.   MRN: [id number]  HPI Patient is a very pleasant 68 year old Caucasian female here today for surgical clearance.  She is planning to have a knee replacement.  She denies any angina.  She denies any chest pain.  She denies any shortness of breath or dyspnea on exertion.  She denies any orthopnea or paroxysmal nocturnal dyspnea.  She has no history of congestive heart failure or renal failure.  She denies any recent bleeding.  She denies any hematemesis or melena or hematochezia.  She is on aspirin and fish oil and I recommended that she stop this 1 week prior to surgery.  Her blood pressure today is well controlled.  She does have a very significant family history of cardiovascular disease in her mother, her brother, and 3 of her maternal uncles. Past Medical History:  Diagnosis Date  . Anxiety   . Arthritis    knees   . GERD (gastroesophageal reflux disease)   . Heart murmur   . Hyperlipidemia   . Hypertension   . Microscopic hematuria   . Obesity    Past Surgical History:  Procedure Laterality Date  . COLONOSCOPY    . KNEE ARTHROSCOPY Left 04/22/2013   Procedure: LEFT KNEE ARTHROSCOPY WITH medial meniscusectomy and chondroplasty;  Surgeon: Gearlean Alf, MD;  Location: [facility];  Service: Orthopedics;  Laterality: Left;  . TONSILLECTOMY    . TUBAL LIGATION     Current Outpatient Medications on File Prior to Visit  Medication Sig Dispense Refill  . aspirin 81 MG tablet Take 1 tablet (81 mg total) by mouth at bedtime. 30 tablet 1  . calcium carbonate (OS-CAL) 600 MG TABS tablet Take 1 tablet (600 mg total) by mouth daily. 60 tablet 1  . CALCIUM PO Take by mouth.    . cholecalciferol (VITAMIN D) 1000 units tablet Take 1 tablet (1,000 Units total) by mouth daily. 30 tablet 1  . fish oil-omega-3 fatty acids 1000 MG capsule Take 2 g by mouth daily.     . hydrochlorothiazide (HYDRODIURIL) 25 MG tablet TAKE 1  TABLET(25 MG) BY MOUTH DAILY 90 tablet 3  . ibuprofen (ADVIL,MOTRIN) 200 MG tablet Take 600 mg by mouth every 6 (six) hours as needed. For pain    . meloxicam (MOBIC) 15 MG tablet Take 1 tablet (15 mg total) by mouth daily. 90 tablet 1  . Multiple Vitamin (MULTIVITAMIN) tablet Take 1 tablet by mouth daily. 30 tablet 1  . ranitidine (ZANTAC) 150 MG capsule Take 1 capsule (150 mg total) by mouth daily. 30 capsule 1  . simvastatin (ZOCOR) 10 MG tablet TAKE 1 TABLET BY MOUTH EVERY DAY AT 6 PM 90 tablet 1   No current facility-administered medications on file prior to visit.    No Known Allergies Social History   Socioeconomic History  . Marital status: Married    Spouse name: Not on file  . Number of children: Not on file  . Years of education: Not on file  . Highest education level: Not on file  Occupational History  . Not on file  Social Needs  . Financial resource strain: Not on file  . Food insecurity:    Worry: Not on file    Inability: Not on file  . Transportation needs:    Medical: Not on file    Non-medical: Not on file  Tobacco Use  . Smoking status: Former Research scientist (life sciences)  .  Smokeless tobacco: Never Used  Substance and Sexual Activity  . Alcohol use: Yes    Comment: rare  . Drug use: No  . Sexual activity: Yes    Birth control/protection: Surgical  Lifestyle  . Physical activity:    Days per week: Not on file    Minutes per session: Not on file  . Stress: Not on file  Relationships  . Social connections:    Talks on phone: Not on file    Gets together: Not on file    Attends religious service: Not on file    Active member of club or organization: Not on file    Attends meetings of clubs or organizations: Not on file    Relationship status: Not on file  . Intimate partner violence:    Fear of current or ex partner: Not on file    Emotionally abused: Not on file    Physically abused: Not on file    Forced sexual activity: Not on file  Other Topics Concern  . Not on  file  Social History Narrative   Worked in [employer].   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       No other exercise.   Married.       Never smoked.   Family History  Problem Relation Age of Onset  . Heart disease Mother 2       MI at 66 and 66  . Cancer Father 31       Prostate Cancer  . Colon polyps Father   . Stomach cancer Father   . Esophageal cancer Neg Hx   . Rectal cancer Neg Hx      Review of Systems  All other systems reviewed and are negative.      Objective:   Physical Exam Vitals signs reviewed.  Constitutional:      General: She is not in acute distress.    Appearance: Normal appearance. She is normal weight. She is not ill-appearing, toxic-appearing or diaphoretic.  Cardiovascular:     Rate and Rhythm: Normal rate and regular rhythm.     Pulses: Normal pulses.     Heart sounds: Normal heart sounds. No murmur. No friction rub. No gallop.   Pulmonary:     Effort: Pulmonary effort is normal. No respiratory distress.     Breath sounds: Normal breath sounds. No stridor. No wheezing, rhonchi or rales.  Chest:     Chest wall: No tenderness.  Abdominal:     General: Bowel sounds are normal. There is no distension.     Palpations: Abdomen is soft.     Tenderness: There is no abdominal tenderness. There is no guarding or rebound.  Musculoskeletal:     Right lower leg: No edema.     Left lower leg: No edema.  Lymphadenopathy:     Cervical: No cervical adenopathy.  Neurological:     General: No focal deficit present.     Mental Status: She is alert and oriented to person, place, and time.     Cranial Nerves: No cranial nerve deficit.     Sensory: No sensory deficit.     Coordination: Coordination normal.           Assessment & Plan:  Essential hypertension - Plan: CBC with Differential/Platelet, COMPLETE METABOLIC PANEL WITH GFR, Lipid panel  Patient's physical exam is normal.  I will  check a CBC to rule out anemia  as well as a CMP to evaluate her kidney and liver test.  Also check a fasting lipid panel while the patient is here.  Blood pressure is excellent.  If lab work is normal, patient is medically cleared to proceed with her upcoming surgery.  We did discuss referral to cardiology for possible cardiac CT given her significant family history after she recovers from her knee replacement.  We will discuss this further in the future.  At the present time I see no medical contraindications to her proceeding with her upcoming surgery

## 2018-11-26 LAB — COMPLETE METABOLIC PANEL WITH GFR
AG Ratio: 1.7 (calc) (ref 1.0–2.5)
ALT: 19 U/L (ref 6–29)
AST: 30 U/L (ref 10–35)
Albumin: 4.1 g/dL (ref 3.6–5.1)
Alkaline phosphatase (APISO): 47 U/L (ref 37–153)
BUN: 14 mg/dL (ref 7–25)
CO2: 31 mmol/L (ref 20–32)
Calcium: 9.8 mg/dL (ref 8.6–10.4)
Chloride: 105 mmol/L (ref 98–110)
Creat: 0.68 mg/dL (ref 0.50–0.99)
GFR, Est African American: 106 mL/min/{1.73_m2} (ref >=60)
GFR, Est Non African American: 91 mL/min/{1.73_m2} (ref >=60)
GLOBULIN: 2.4 g/dL (ref 1.9–3.7)
Glucose, Bld: 97 mg/dL (ref 65–99)
Potassium: 4.7 mmol/L (ref 3.5–5.3)
SODIUM: 143 mmol/L (ref 135–146)
Total Bilirubin: 0.6 mg/dL (ref 0.2–1.2)
Total Protein: 6.5 g/dL (ref 6.1–8.1)

## 2018-11-26 LAB — CBC WITH DIFFERENTIAL/PLATELET
ABSOLUTE MONOCYTES: 626 {cells}/uL (ref 200–950)
Basophils Absolute: 41 cells/uL (ref 0–200)
Basophils Relative: 0.7 %
Eosinophils Absolute: 267 cells/uL (ref 15–500)
Eosinophils Relative: 4.6 %
HCT: 43.5 % (ref 35.0–45.0)
Hemoglobin: 14.4 g/dL (ref 11.7–15.5)
Lymphs Abs: 1653 cells/uL (ref 850–3900)
MCH: 28.6 pg (ref 27.0–33.0)
MCHC: 33.1 g/dL (ref 32.0–36.0)
MCV: 86.3 fL (ref 80.0–100.0)
MPV: 10.3 fL (ref 7.5–12.5)
Monocytes Relative: 10.8 %
Neutro Abs: 3213 cells/uL (ref 1500–7800)
Neutrophils Relative %: 55.4 %
Platelets: 248 10*3/uL (ref 140–400)
RBC: 5.04 10*6/uL (ref 3.80–5.10)
RDW: 13.1 % (ref 11.0–15.0)
Total Lymphocyte: 28.5 %
WBC: 5.8 10*3/uL (ref 3.8–10.8)

## 2018-11-26 LAB — LIPID PANEL
Cholesterol: 193 mg/dL (ref <200)
HDL: 65 mg/dL (ref >=50)
LDL Cholesterol (Calc): 109 mg/dL (calc) — ABNORMAL HIGH
Non-HDL Cholesterol (Calc): 128 mg/dL (calc) (ref <130)
Total CHOL/HDL Ratio: 3 (calc) (ref <5.0)
Triglycerides: 99 mg/dL (ref <150)

## 2018-12-29 ENCOUNTER — Encounter (HOSPITAL_COMMUNITY): Admission: RE | Payer: Self-pay | Source: Home / Self Care

## 2018-12-29 ENCOUNTER — Inpatient Hospital Stay (HOSPITAL_COMMUNITY): Admission: RE | Admit: 2018-12-29 | Payer: Self-pay | Source: Home / Self Care | Admitting: Orthopedic Surgery

## 2018-12-29 SURGERY — ARTHROPLASTY, KNEE, TOTAL
Anesthesia: Choice | Laterality: Right

## 2019-01-19 ENCOUNTER — Other Ambulatory Visit: Payer: Self-pay | Admitting: Family Medicine

## 2019-02-02 NOTE — H&P (Signed)
TOTAL KNEE ADMISSION H&P  Patient is being admitted for right total knee arthroplasty.  Subjective:  Chief Complaint:right knee pain.  HPI: [name], 68 y.o. female, has a history of pain and functional disability in the right knee due to arthritis and has failed non-surgical conservative treatments for greater than 12 weeks to includecorticosteriod injections, viscosupplementation injections and activity modification.  Onset of symptoms was gradual, starting several years ago with gradually worsening course since that time. The patient noted no past surgery on the right knee(s).  Patient currently rates pain in the right knee(s) at 7 out of 10 with activity. Patient has worsening of pain with activity and weight bearing, pain that interferes with activities of daily living, crepitus, joint swelling and instability.  Patient has evidence of bone-on-bone osteoarthritis of the medial and patellofemoral compartments with significant lateral subluxation of the tibia on the right by imaging studies. There is no active infection.  Patient Active Problem List   Diagnosis Date Noted   Hypertension    Acute medial meniscal tear 04/22/2013   Hyperlipidemia    Anxiety    Obesity    DEGENERATIVE JOINT DISEASE, RIGHT KNEE 04/06/2010   HEMATURIA UNSPECIFIED 04/04/2009   OVERWEIGHT 01/02/2008   MICROSCOPIC HEMATURIA 01/02/2008   PERIPHERAL EDEMA 01/02/2008   OSTEOARTHRITIS 06/02/2007   History of colonic polyps 06/02/2007   CHEST PAIN, ATYPICAL, HX OF 05/27/2007   Past Medical History:  Diagnosis Date   Anxiety    Arthritis    knees    GERD (gastroesophageal reflux disease)    Heart murmur    Hyperlipidemia    Hypertension    Microscopic hematuria    Obesity     Past Surgical History:  Procedure Laterality Date   COLONOSCOPY     KNEE ARTHROSCOPY Left 04/22/2013   Procedure: LEFT KNEE ARTHROSCOPY WITH medial meniscusectomy and chondroplasty;  Surgeon: Gearlean Alf, MD;  Location: [facility];  Service: Orthopedics;  Laterality: Left;   TONSILLECTOMY     TUBAL LIGATION      No current facility-administered medications for this encounter.    Current Outpatient Medications  Medication Sig Dispense Refill Last Dose   aspirin 81 MG tablet Take 1 tablet (81 mg total) by mouth at bedtime. 30 tablet 1 Taking   calcium carbonate (OS-CAL) 600 MG TABS tablet Take 1 tablet (600 mg total) by mouth daily. 60 tablet 1 Taking   CALCIUM PO Take by mouth.   Taking   cholecalciferol (VITAMIN D) 1000 units tablet Take 1 tablet (1,000 Units total) by mouth daily. 30 tablet 1 Taking   fish oil-omega-3 fatty acids 1000 MG capsule Take 2 g by mouth daily.    Taking   hydrochlorothiazide (HYDRODIURIL) 25 MG tablet TAKE 1 TABLET(25 MG) BY MOUTH DAILY 90 tablet 3 Taking   ibuprofen (ADVIL,MOTRIN) 200 MG tablet Take 600 mg by mouth every 6 (six) hours as needed. For pain   Taking   meloxicam (MOBIC) 15 MG tablet Take 1 tablet (15 mg total) by mouth daily. 90 tablet 1 Taking   Multiple Vitamin (MULTIVITAMIN) tablet Take 1 tablet by mouth daily. 30 tablet 1 Taking   simvastatin (ZOCOR) 10 MG tablet TAKE 1 TABLET BY MOUTH EVERY DAY AT 6 PM 90 tablet 1    No Known Allergies  Social History   Tobacco Use   Smoking status: Former Smoker   Smokeless tobacco: Never Used  Substance Use Topics   Alcohol use: Yes    Comment: rare  Family History  Problem Relation Age of Onset   Heart disease Mother 53       MI at 1 and 101   Cancer Father 69       Prostate Cancer   Colon polyps Father    Stomach cancer Father    Esophageal cancer Neg Hx    Rectal cancer Neg Hx      Review of Systems  Constitutional: Negative for chills and fever.  HENT: Negative for congestion, sore throat and tinnitus.   Eyes: Negative for double vision, photophobia and pain.  Respiratory: Negative for cough, shortness of breath and wheezing.   Cardiovascular: Negative for  chest pain, palpitations and orthopnea.  Gastrointestinal: Negative for heartburn, nausea and vomiting.  Genitourinary: Negative for dysuria, frequency and urgency.  Musculoskeletal: Positive for joint pain.  Neurological: Negative for dizziness, weakness and headaches.    Objective:  Physical Exam  Well nourished and well developed.  General: Alert and oriented x3, cooperative and pleasant, no acute distress.  Head: normocephalic, atraumatic, neck supple.  Eyes: EOMI.  Respiratory: breath sounds clear in all fields, no wheezing, rales, or rhonchi. Cardiovascular: Regular rate and rhythm, no murmurs, gallops or rubs.  Abdomen: non-tender to palpation and soft, normoactive bowel sounds. Musculoskeletal:  Right Knee Exam:  No effusion. No Swelling. Range of motion is 0-130 degrees.  Marked crepitus on range of motion of the knee.  Positive medial joint line tenderness No lateral joint line tenderness.  Psuedo laxity is present when I correct her from a varus position to a neutral position. No gross instability.   Calves soft and nontender. Motor function intact in LE. Strength 5/5 LE bilaterally. Neuro: Distal pulses 2+. Sensation to light touch intact in LE.  Vital signs in last 24 hours: Blood pressure: 142/100 mmHg Pulse: 60 bpm  Labs:   Estimated body mass index is 32.51 kg/m as calculated from the following:   Height as of 11/25/18: 5' 8.5" (1.74 m).   Weight as of 11/25/18: 98.4 kg.   Imaging Review Plain radiographs demonstrate severe degenerative joint disease of the right knee(s). The overall alignment isneutral. The bone quality appears to be adequate for age and reported activity level.      Assessment/Plan:  End stage arthritis, right knee   The patient history, physical examination, clinical judgment of the provider and imaging studies are consistent with end stage degenerative joint disease of the right knee(s) and total knee arthroplasty is deemed  medically necessary. The treatment options including medical management, injection therapy arthroscopy and arthroplasty were discussed at length. The risks and benefits of total knee arthroplasty were presented and reviewed. The risks due to aseptic loosening, infection, stiffness, patella tracking problems, thromboembolic complications and other imponderables were discussed. The patient acknowledged the explanation, agreed to proceed with the plan and consent was signed. Patient is being admitted for inpatient treatment for surgery, pain control, PT, OT, prophylactic antibiotics, VTE prophylaxis, progressive ambulation and ADL's and discharge planning. The patient is planning to be discharged home.    Anticipated LOS equal to or greater than 2 midnights due to - Age 26 and older with one or more of the following:  - Obesity  - Expected need for hospital services (PT, OT, Nursing) required for safe  discharge  - Anticipated need for postoperative skilled nursing care or inpatient rehab  - Active co-morbidities: None OR   - Unanticipated findings during/Post Surgery: None  - Patient is a high risk of re-admission due to: None  Therapy Plans: outpatient therapy at [facility] Disposition: Home with husband Planned DVT Prophylaxis: Aspirin 325 mg BID DME needed: None PCP: Jenna Luo, MD TXA: IV Allergies: NKDA Anesthesia Concerns: None BMI: 32.2  - Patient was instructed on what medications to stop prior to surgery. - Follow-up visit in 2 weeks with Dr. Wynelle Link - Begin physical therapy following surgery - Pre-operative lab work as pre-surgical testing - Prescriptions will be provided in hospital at time of discharge  Theresa Duty, PA-C Orthopedic Surgery [facility] [place]

## 2019-02-10 ENCOUNTER — Encounter (HOSPITAL_COMMUNITY)
Admission: RE | Admit: 2019-02-10 | Discharge: 2019-02-10 | Disposition: A | Discharge status: Home / Self Care | Payer: Medicare Other | Source: Ambulatory Visit | Attending: Orthopedic Surgery | Admitting: Orthopedic Surgery

## 2019-02-10 ENCOUNTER — Other Ambulatory Visit: Payer: Self-pay

## 2019-02-10 ENCOUNTER — Encounter (HOSPITAL_COMMUNITY): Payer: Self-pay

## 2019-02-10 ENCOUNTER — Other Ambulatory Visit (HOSPITAL_COMMUNITY): Payer: Self-pay | Admitting: *Deleted

## 2019-02-10 DIAGNOSIS — Z01818 Encounter for other preprocedural examination: Secondary | ICD-10-CM | POA: Insufficient documentation

## 2019-02-10 DIAGNOSIS — I10 Essential (primary) hypertension: Secondary | ICD-10-CM | POA: Diagnosis not present

## 2019-02-10 LAB — SURGICAL PCR SCREEN
MRSA, PCR: NEGATIVE
Staphylococcus aureus: NEGATIVE

## 2019-02-10 LAB — COMPREHENSIVE METABOLIC PANEL
ALT: 19 U/L (ref 0–44)
AST: 19 U/L (ref 15–41)
Albumin: 4.1 g/dL (ref 3.5–5.0)
Alkaline Phosphatase: 50 U/L (ref 38–126)
Anion gap: 8 (ref 5–15)
BUN: 13 mg/dL (ref 8–23)
CO2: 29 mmol/L (ref 22–32)
Calcium: 9.5 mg/dL (ref 8.9–10.3)
Chloride: 105 mmol/L (ref 98–111)
Creatinine, Ser: 0.74 mg/dL (ref 0.44–1.00)
GFR calc Af Amer: >60 mL/min (ref >60)
GFR calc non Af Amer: >60 mL/min (ref >60)
Glucose, Bld: 104 mg/dL — ABNORMAL HIGH (ref 70–99)
Potassium: 3.4 mmol/L — ABNORMAL LOW (ref 3.5–5.1)
Sodium: 142 mmol/L (ref 135–145)
Total Bilirubin: 0.7 mg/dL (ref 0.3–1.2)
Total Protein: 7.2 g/dL (ref 6.5–8.1)

## 2019-02-10 LAB — CBC
HCT: 45.5 % (ref 36.0–46.0)
Hemoglobin: 14.4 g/dL (ref 12.0–15.0)
MCH: 28.3 pg (ref 26.0–34.0)
MCHC: 31.6 g/dL (ref 30.0–36.0)
MCV: 89.6 fL (ref 80.0–100.0)
Platelets: 240 10*3/uL (ref 150–400)
RBC: 5.08 MIL/uL (ref 3.87–5.11)
RDW: 13.6 % (ref 11.5–15.5)
WBC: 5.9 10*3/uL (ref 4.0–10.5)
nRBC: 0 % (ref 0.0–0.2)

## 2019-02-10 LAB — PROTIME-INR
INR: 1 (ref 0.8–1.2)
Prothrombin Time: 13 seconds (ref 11.4–15.2)

## 2019-02-10 LAB — APTT: aPTT: 27 seconds (ref 24–36)

## 2019-02-10 NOTE — Patient Instructions (Addendum)
[name]    Your procedure is scheduled on: 02-16-2019  Report to [facility] Main  Entrance  Report to admitting at [facility] TEST ON Thursday 02-12-2019 @ 105 PM, THIS TEST MUST BE DONE BEFORE SURGERY, COME TO [facility]   Call this number if you have problems the morning of surgery [phone]   Remember:  [place] AND RINSE YOUR MOUTH OUT, NO CHEWING GUM CANDY OR MINTS.   NO SOLID FOOD AFTER MIDNIGHT THE NIGHT PRIOR TO SURGERY. NOTHING BY MOUTH EXCEPT CLEAR LIQUIDS UNTIL  430 AM. PLEASE FINISH ENSURE DRINK PER SURGEON ORDER  TIME WHICH NEEDS TO BE COMPLETED AT 430 AM. NOTHING BY MOUTH AFTER 430 AM.     CLEAR LIQUID DIET   Foods Allowed                                                                     Foods Excluded  Coffee and tea, regular and decaf                             liquids that you cannot  Plain Jell-O in any flavor                                             see through such as: Fruit ices (not with fruit pulp)                                     milk, soups, orange juice  Iced Popsicles                                    All solid food Carbonated beverages, regular and diet                                    Cranberry, grape and apple juices Sports drinks like Gatorade Lightly seasoned clear broth or consume(fat free) Sugar, honey syrup  Sample Menu Breakfast                                Lunch                                     Supper Cranberry juice                    Beef broth                            Chicken broth Jell-O  Grape juice                           Apple juice Coffee or tea                        Jell-O                                      Popsicle                                                Coffee or tea                        Coffee or  tea  _____________________________________________________________________    Take these medicines the morning of surgery with A SIP OF WATER:NONE                               You may not have any metal on your body including hair pins and              piercings  Do not wear jewelry, make-up, lotions, powders or perfumes, deodorant             Do not wear nail polish.  Do not shave  48 hours prior to surgery.              Men may shave face and neck.   Do not bring valuables to the hospital. [place].  Contacts, dentures or bridgework may not be worn into surgery.  Leave suitcase in the car. After surgery it may be brought to your room.      _____________________________________________________________________             [facility] - Preparing for Surgery Before surgery, you can play an important role.  Because skin is not sterile, your skin needs to be as free of germs as possible.  You can reduce the number of germs on your skin by washing with CHG (chlorahexidine gluconate) soap before surgery.  CHG is an antiseptic cleaner which kills germs and bonds with the skin to continue killing germs even after washing. Please DO NOT use if you have an allergy to CHG or antibacterial soaps.  If your skin becomes reddened/irritated stop using the CHG and inform your nurse when you arrive at Short Stay. Do not shave (including legs and underarms) for at least 48 hours prior to the first CHG shower.  You may shave your face/neck. Please follow these instructions carefully:  1.  Shower with CHG Soap the night before surgery and the  morning of Surgery.  2.  If you choose to wash your hair, wash your hair first as usual with your  normal  shampoo.  3.  After you shampoo, rinse your hair and body thoroughly to remove the  shampoo.                           4.  Use CHG as you  would any other liquid soap.  You can apply chg directly  to the skin  and wash                       Gently with a scrungie or clean washcloth.  5.  Apply the CHG Soap to your body ONLY FROM THE NECK DOWN.   Do not use on face/ open                           Wound or open sores. Avoid contact with eyes, ears mouth and genitals (private parts).                       Wash face,  Genitals (private parts) with your normal soap.             6.  Wash thoroughly, paying special attention to the area where your surgery  will be performed.  7.  Thoroughly rinse your body with warm water from the neck down.  8.  DO NOT shower/wash with your normal soap after using and rinsing off  the CHG Soap.                9.  Pat yourself dry with a clean towel.            10.  Wear clean pajamas.            11.  Place clean sheets on your bed the night of your first shower and do not  sleep with pets. Day of Surgery : Do not apply any lotions/deodorants the morning of surgery.  Please wear clean clothes to the hospital/surgery center.  FAILURE TO FOLLOW THESE INSTRUCTIONS MAY RESULT IN THE CANCELLATION OF YOUR SURGERY PATIENT SIGNATURE_________________________________  NURSE SIGNATURE__________________________________  ________________________________________________________________________   Adam Phenix  An incentive spirometer is a tool that can help keep your lungs clear and active. This tool measures how well you are filling your lungs with each breath. Taking long deep breaths may help reverse or decrease the chance of developing breathing (pulmonary) problems (especially infection) following:  A long period of time when you are unable to move or be active. BEFORE THE PROCEDURE   If the spirometer includes an indicator to show your best effort, your nurse or respiratory therapist will set it to a desired goal.  If possible, sit up straight or lean slightly forward. Try not to slouch.  Hold the incentive spirometer in an upright position. INSTRUCTIONS FOR USE   1. Sit on the edge of your bed if possible, or sit up as far as you can in bed or on a chair. 2. Hold the incentive spirometer in an upright position. 3. Breathe out normally. 4. Place the mouthpiece in your mouth and seal your lips tightly around it. 5. Breathe in slowly and as deeply as possible, raising the piston or the ball toward the top of the column. 6. Hold your breath for 3-5 seconds or for as long as possible. Allow the piston or ball to fall to the bottom of the column. 7. Remove the mouthpiece from your mouth and breathe out normally. 8. Rest for a few seconds and repeat Steps 1 through 7 at least 10 times every 1-2 hours when you are awake. Take your time and take a few normal breaths between deep breaths. 9. The spirometer may include an indicator to show your best effort. Use the  indicator as a goal to work toward during each repetition. 10. After each set of 10 deep breaths, practice coughing to be sure your lungs are clear. If you have an incision (the cut made at the time of surgery), support your incision when coughing by placing a pillow or rolled up towels firmly against it. Once you are able to get out of bed, walk around indoors and cough well. You may stop using the incentive spirometer when instructed by your caregiver.  RISKS AND COMPLICATIONS  Take your time so you do not get dizzy or light-headed.  If you are in pain, you may need to take or ask for pain medication before doing incentive spirometry. It is harder to take a deep breath if you are having pain. AFTER USE  Rest and breathe slowly and easily.  It can be helpful to keep track of a log of your progress. Your caregiver can provide you with a simple table to help with this. If you are using the spirometer at home, follow these instructions: [place] IF:   You are having difficultly using the spirometer.  You have trouble using the spirometer as often as instructed.  Your pain medication is  not giving enough relief while using the spirometer.  You develop fever of 100.5 F (38.1 C) or higher. SEEK IMMEDIATE MEDICAL CARE IF:   You cough up bloody sputum that had not been present before.  You develop fever of 102 F (38.9 C) or greater.  You develop worsening pain at or near the incision site. MAKE SURE YOU:   Understand these instructions.  Will watch your condition.  Will get help right away if you are not doing well or get worse. Document Released: 01/14/2007 Document Revised: 11/26/2011 Document Reviewed: 03/17/2007 Exit[employer] Patient Information 2014 Exit[employer], [place].   ________________________________________________________________________  WHAT IS A BLOOD TRANSFUSION? Blood Transfusion Information  A transfusion is the replacement of blood or some of its parts. Blood is made up of multiple cells which provide different functions.  Red blood cells carry oxygen and are used for blood loss replacement.  White blood cells fight against infection.  Platelets control bleeding.  Plasma helps clot blood.  Other blood products are available for specialized needs, such as hemophilia or other clotting disorders. BEFORE THE TRANSFUSION  Who gives blood for transfusions?   Healthy volunteers who are fully evaluated to make sure their blood is safe. This is blood bank blood. Transfusion therapy is the safest it has ever been in the practice of medicine. Before blood is taken from a donor, a complete history is taken to make sure that person has no history of diseases nor engages in risky social behavior (examples are intravenous drug use or sexual activity with multiple partners). The donor's travel history is screened to minimize risk of transmitting infections, such as malaria. The donated blood is tested for signs of infectious diseases, such as HIV and hepatitis. The blood is then tested to be sure it is compatible with you in order to minimize the chance of a  transfusion reaction. If you or a relative donates blood, this is often done in anticipation of surgery and is not appropriate for emergency situations. It takes many days to process the donated blood. RISKS AND COMPLICATIONS Although transfusion therapy is very safe and saves many lives, the main dangers of transfusion include:   Getting an infectious disease.  Developing a transfusion reaction. This is an allergic reaction to something in the blood you  were given. Every precaution is taken to prevent this. The decision to have a blood transfusion has been considered carefully by your caregiver before blood is given. Blood is not given unless the benefits outweigh the risks. AFTER THE TRANSFUSION  Right after receiving a blood transfusion, you will usually feel much better and more energetic. This is especially true if your red blood cells have gotten low (anemic). The transfusion raises the level of the red blood cells which carry oxygen, and this usually causes an energy increase.  The nurse administering the transfusion will monitor you carefully for complications. HOME CARE INSTRUCTIONS  No special instructions are needed after a transfusion. You may find your energy is better. Speak with your caregiver about any limitations on activity for underlying diseases you may have. SEEK MEDICAL CARE IF:   Your condition is not improving after your transfusion.  You develop redness or irritation at the intravenous (IV) site. SEEK IMMEDIATE MEDICAL CARE IF:  Any of the following symptoms occur over the next 12 hours:  Shaking chills.  You have a temperature by mouth above 102 F (38.9 C), not controlled by medicine.  Chest, back, or muscle pain.  People around you feel you are not acting correctly or are confused.  Shortness of breath or difficulty breathing.  Dizziness and fainting.  You get a rash or develop hives.  You have a decrease in urine output.  Your urine turns a dark  color or changes to pink, red, or brown. Any of the following symptoms occur over the next 10 days:  You have a temperature by mouth above 102 F (38.9 C), not controlled by medicine.  Shortness of breath.  Weakness after normal activity.  The white part of the eye turns yellow (jaundice).  You have a decrease in the amount of urine or are urinating less often.  Your urine turns a dark color or changes to pink, red, or brown. Document Released: 08/31/2000 Document Revised: 11/26/2011 Document Reviewed: 04/19/2008 [facility] Patient Information 2014 [place], [place].  _______________________________________________________________________

## 2019-02-11 LAB — ABO/RH: ABO/RH(D): A POS

## 2019-02-12 ENCOUNTER — Other Ambulatory Visit (HOSPITAL_COMMUNITY)
Admission: RE | Admit: 2019-02-12 | Discharge: 2019-02-12 | Disposition: A | Discharge status: Home / Self Care | Payer: Medicare Other | Source: Ambulatory Visit | Attending: Orthopedic Surgery | Admitting: Orthopedic Surgery

## 2019-02-12 DIAGNOSIS — I10 Essential (primary) hypertension: Secondary | ICD-10-CM | POA: Diagnosis not present

## 2019-02-12 DIAGNOSIS — Z01818 Encounter for other preprocedural examination: Secondary | ICD-10-CM | POA: Diagnosis not present

## 2019-02-13 LAB — NOVEL CORONAVIRUS, NAA (HOSP ORDER, SEND-OUT TO REF LAB; TAT 18-24 HRS): SARS-CoV-2, NAA: NOT DETECTED

## 2019-02-13 NOTE — Progress Notes (Signed)
SPOKE W/  Patinet: Tyrah Broers  Has seasonal allergies and chronic cough.    SCREENING SYMPTOMS OF COVID 19:  COUGH-- at night (chronic dry cough)  RUNNY NOSE--- no  SORE THROAT---no  NASAL CONGESTION----sinus drainage(seasonal allergy symptoms)  SNEEZING----no  SHORTNESS OF BREATH---no  DIFFICULTY BREATHING---no  TEMP >100.0 -----no  UNEXPLAINED BODY ACHES------no  CHILLS -------- no  HEADACHES --------slight HA maybe allergies-  LOSS OF SMELL/ TASTE --------no   HAVE YOU OR ANY FAMILY MEMBER TRAVELLED PAST 14 DAYS OUT OF THE   COUNTY---([place] to get pickup food) STATE----no COUNTRY----no  HAVE YOU OR ANY FAMILY MEMBER BEEN EXPOSED TO ANYONE WITH COVID 19?   no

## 2019-02-15 MED ORDER — BUPIVACAINE LIPOSOME 1.3 % IJ SUSP
20.0000 mL | INTRAMUSCULAR | Status: DC
  Filled 2019-02-15: qty 20

## 2019-02-15 NOTE — Anesthesia Preprocedure Evaluation (Addendum)
Anesthesia Evaluation  Patient identified by MRN, date of birth, ID band Patient awake    Reviewed: Allergy & Precautions, NPO status , Patient's Chart, lab work & pertinent test results  History of Anesthesia Complications (+) PSEUDOCHOLINESTERASE DEFICIENCY  Airway Mallampati: II  TM Distance: >3 FB Neck ROM: Full    Dental no notable dental hx. (+) Teeth Intact   Pulmonary former smoker,    Pulmonary exam normal breath sounds clear to auscultation       Cardiovascular hypertension, Pt. on medications Normal cardiovascular exam Rhythm:Regular Rate:Normal  ekg SB   Neuro/Psych negative neurological ROS  negative psych ROS   GI/Hepatic Neg liver ROS, GERD  ,  Endo/Other  negative endocrine ROS  Renal/GU negative Renal ROS     Musculoskeletal  (+) Arthritis ,   Abdominal   Peds  Hematology negative hematology ROS (+)   Anesthesia Other Findings   Reproductive/Obstetrics                            Lab Results  Component Value Date   CREATININE 0.74 02/10/2019   BUN 13 02/10/2019   NA 142 02/10/2019   K 3.4 (L) 02/10/2019   CL 105 02/10/2019   CO2 29 02/10/2019   Lab Results  Component Value Date   WBC 5.9 02/10/2019   HGB 14.4 02/10/2019   HCT 45.5 02/10/2019   MCV 89.6 02/10/2019   PLT 240 02/10/2019    Anesthesia Physical Anesthesia Plan  ASA: II  Anesthesia Plan: Spinal   Post-op Pain Management:  Regional for Post-op pain   Induction: Intravenous  PONV Risk Score and Plan: 2 and Treatment may vary due to age or medical condition, Ondansetron and Dexamethasone  Airway Management Planned: Natural Airway and Simple Face Mask  Additional Equipment:   Intra-op Plan:   Post-operative Plan:   Informed Consent: I have reviewed the patients History and Physical, chart, labs and discussed the procedure including the risks, benefits and alternatives for the proposed  anesthesia with the patient or authorized representative who has indicated his/her understanding and acceptance.     Dental advisory given  Plan Discussed with: CRNA  Anesthesia Plan Comments: (R TKA w adductor canal under spinal)       Anesthesia Quick Evaluation

## 2019-02-16 ENCOUNTER — Encounter (HOSPITAL_COMMUNITY): Payer: Self-pay

## 2019-02-16 ENCOUNTER — Other Ambulatory Visit: Payer: Self-pay

## 2019-02-16 ENCOUNTER — Inpatient Hospital Stay (HOSPITAL_COMMUNITY): Payer: Medicare Other | Admitting: Physician Assistant

## 2019-02-16 ENCOUNTER — Inpatient Hospital Stay (HOSPITAL_COMMUNITY)
Admission: RE | Admit: 2019-02-16 | Discharge: 2019-02-18 | DRG: 470 | Disposition: A | Discharge status: Home / Self Care | Payer: Medicare Other | Attending: Orthopedic Surgery | Admitting: Orthopedic Surgery

## 2019-02-16 ENCOUNTER — Inpatient Hospital Stay (HOSPITAL_COMMUNITY): Payer: Medicare Other | Admitting: Anesthesiology

## 2019-02-16 ENCOUNTER — Encounter (HOSPITAL_COMMUNITY)
Admission: RE | Disposition: A | Discharge status: Home / Self Care | Payer: Self-pay | Source: Home / Self Care | Attending: Orthopedic Surgery

## 2019-02-16 DIAGNOSIS — Z8249 Family history of ischemic heart disease and other diseases of the circulatory system: Secondary | ICD-10-CM

## 2019-02-16 DIAGNOSIS — Z800 Family history of malignant neoplasm of digestive organs: Secondary | ICD-10-CM

## 2019-02-16 DIAGNOSIS — Z7982 Long term (current) use of aspirin: Secondary | ICD-10-CM

## 2019-02-16 DIAGNOSIS — Z87891 Personal history of nicotine dependence: Secondary | ICD-10-CM | POA: Diagnosis not present

## 2019-02-16 DIAGNOSIS — I10 Essential (primary) hypertension: Secondary | ICD-10-CM | POA: Diagnosis present

## 2019-02-16 DIAGNOSIS — Z79899 Other long term (current) drug therapy: Secondary | ICD-10-CM

## 2019-02-16 DIAGNOSIS — E669 Obesity, unspecified: Secondary | ICD-10-CM | POA: Diagnosis present

## 2019-02-16 DIAGNOSIS — Z791 Long term (current) use of non-steroidal anti-inflammatories (NSAID): Secondary | ICD-10-CM | POA: Diagnosis not present

## 2019-02-16 DIAGNOSIS — G8918 Other acute postprocedural pain: Secondary | ICD-10-CM | POA: Diagnosis not present

## 2019-02-16 DIAGNOSIS — M659 Synovitis and tenosynovitis, unspecified: Secondary | ICD-10-CM | POA: Diagnosis present

## 2019-02-16 DIAGNOSIS — E785 Hyperlipidemia, unspecified: Secondary | ICD-10-CM | POA: Diagnosis not present

## 2019-02-16 DIAGNOSIS — M1711 Unilateral primary osteoarthritis, right knee: Secondary | ICD-10-CM | POA: Diagnosis not present

## 2019-02-16 DIAGNOSIS — Z9851 Tubal ligation status: Secondary | ICD-10-CM | POA: Diagnosis not present

## 2019-02-16 DIAGNOSIS — M25761 Osteophyte, right knee: Secondary | ICD-10-CM | POA: Diagnosis not present

## 2019-02-16 DIAGNOSIS — Z6831 Body mass index (BMI) 31.0-31.9, adult: Secondary | ICD-10-CM

## 2019-02-16 DIAGNOSIS — M179 Osteoarthritis of knee, unspecified: Secondary | ICD-10-CM

## 2019-02-16 DIAGNOSIS — M1710 Unilateral primary osteoarthritis, unspecified knee: Secondary | ICD-10-CM | POA: Diagnosis present

## 2019-02-16 HISTORY — PX: TOTAL KNEE ARTHROPLASTY: SHX125

## 2019-02-16 LAB — TYPE AND SCREEN
ABO/RH(D): A POS
Antibody Screen: NEGATIVE

## 2019-02-16 SURGERY — ARTHROPLASTY, KNEE, TOTAL
Anesthesia: Spinal | Laterality: Right

## 2019-02-16 MED ORDER — METHOCARBAMOL 500 MG PO TABS
500.0000 mg | ORAL_TABLET | Freq: Four times a day (QID) | ORAL | Status: DC | PRN
  Administered 2019-02-16 – 2019-02-17 (×2): 500 mg via ORAL
  Filled 2019-02-16 (×2): qty 1

## 2019-02-16 MED ORDER — METHOCARBAMOL 500 MG IVPB - SIMPLE MED
INTRAVENOUS | Status: AC
  Administered 2019-02-16: 500 mg
  Filled 2019-02-16: qty 50

## 2019-02-16 MED ORDER — POLYETHYLENE GLYCOL 3350 17 G PO PACK: 17.0000 g | PACK | Freq: Every day | ORAL | Status: DC | PRN

## 2019-02-16 MED ORDER — PHENOL 1.4 % MT LIQD: 1.0000 | OROMUCOSAL | Status: DC | PRN

## 2019-02-16 MED ORDER — DOCUSATE SODIUM 100 MG PO CAPS
100.0000 mg | ORAL_CAPSULE | Freq: Two times a day (BID) | ORAL | Status: DC
  Administered 2019-02-16 – 2019-02-18 (×4): 100 mg via ORAL
  Filled 2019-02-16 (×4): qty 1

## 2019-02-16 MED ORDER — ACETAMINOPHEN 10 MG/ML IV SOLN: 1000.0000 mg | Freq: Once | INTRAVENOUS | Status: DC | PRN

## 2019-02-16 MED ORDER — STERILE WATER FOR IRRIGATION IR SOLN
Status: DC | PRN
  Administered 2019-02-16: 2000 mL

## 2019-02-16 MED ORDER — CEFAZOLIN SODIUM-DEXTROSE 2-4 GM/100ML-% IV SOLN
2.0000 g | INTRAVENOUS | Status: AC
  Administered 2019-02-16: 2 g via INTRAVENOUS
  Filled 2019-02-16: qty 100

## 2019-02-16 MED ORDER — BUPIVACAINE IN DEXTROSE 0.75-8.25 % IT SOLN
INTRATHECAL | Status: DC | PRN
  Administered 2019-02-16: 1.6 mL via INTRATHECAL

## 2019-02-16 MED ORDER — TRANEXAMIC ACID-NACL 1000-0.7 MG/100ML-% IV SOLN
1000.0000 mg | INTRAVENOUS | Status: AC
  Administered 2019-02-16: 1000 mg via INTRAVENOUS
  Filled 2019-02-16: qty 100

## 2019-02-16 MED ORDER — DEXAMETHASONE SODIUM PHOSPHATE 10 MG/ML IJ SOLN
10.0000 mg | Freq: Once | INTRAMUSCULAR | Status: AC
  Administered 2019-02-17: 10 mg via INTRAVENOUS
  Filled 2019-02-16: qty 1

## 2019-02-16 MED ORDER — ACETAMINOPHEN 10 MG/ML IV SOLN
1000.0000 mg | Freq: Four times a day (QID) | INTRAVENOUS | Status: DC
  Administered 2019-02-16: 1000 mg via INTRAVENOUS
  Filled 2019-02-16: qty 100

## 2019-02-16 MED ORDER — HYDROCHLOROTHIAZIDE 25 MG PO TABS
25.0000 mg | ORAL_TABLET | Freq: Every day | ORAL | Status: DC
  Administered 2019-02-17 – 2019-02-18 (×2): 25 mg via ORAL
  Filled 2019-02-16 (×2): qty 1

## 2019-02-16 MED ORDER — DEXAMETHASONE SODIUM PHOSPHATE 10 MG/ML IJ SOLN
INTRAMUSCULAR | Status: AC
  Filled 2019-02-16: qty 1

## 2019-02-16 MED ORDER — TRANEXAMIC ACID-NACL 1000-0.7 MG/100ML-% IV SOLN
1000.0000 mg | Freq: Once | INTRAVENOUS | Status: AC
  Administered 2019-02-16: 1000 mg via INTRAVENOUS
  Filled 2019-02-16: qty 100

## 2019-02-16 MED ORDER — PROPOFOL 500 MG/50ML IV EMUL
INTRAVENOUS | Status: DC | PRN
  Administered 2019-02-16: 75 ug/kg/min via INTRAVENOUS

## 2019-02-16 MED ORDER — SODIUM CHLORIDE (PF) 0.9 % IJ SOLN
INTRAMUSCULAR | Status: DC | PRN
  Administered 2019-02-16: 60 mL

## 2019-02-16 MED ORDER — PROPOFOL 10 MG/ML IV BOLUS
INTRAVENOUS | Status: AC
  Filled 2019-02-16: qty 20

## 2019-02-16 MED ORDER — OMEPRAZOLE MAGNESIUM 20 MG PO TBEC: 20.0000 mg | DELAYED_RELEASE_TABLET | Freq: Every day | ORAL | Status: DC

## 2019-02-16 MED ORDER — SODIUM CHLORIDE (PF) 0.9 % IJ SOLN
INTRAMUSCULAR | Status: AC
  Filled 2019-02-16: qty 50

## 2019-02-16 MED ORDER — SIMVASTATIN 20 MG PO TABS
10.0000 mg | ORAL_TABLET | Freq: Every day | ORAL | Status: DC
  Administered 2019-02-16 – 2019-02-17 (×2): 10 mg via ORAL
  Filled 2019-02-16 (×2): qty 1

## 2019-02-16 MED ORDER — SODIUM CHLORIDE (PF) 0.9 % IJ SOLN
INTRAMUSCULAR | Status: AC
  Filled 2019-02-16: qty 10

## 2019-02-16 MED ORDER — PROPOFOL 10 MG/ML IV BOLUS
INTRAVENOUS | Status: AC
  Filled 2019-02-16: qty 40

## 2019-02-16 MED ORDER — ONDANSETRON HCL 4 MG/2ML IJ SOLN
INTRAMUSCULAR | Status: DC | PRN
  Administered 2019-02-16: 4 mg via INTRAVENOUS

## 2019-02-16 MED ORDER — OMEPRAZOLE 20 MG PO CPDR
20.0000 mg | DELAYED_RELEASE_CAPSULE | Freq: Every day | ORAL | Status: DC
  Administered 2019-02-16 – 2019-02-17 (×2): 20 mg via ORAL
  Filled 2019-02-16 (×2): qty 1

## 2019-02-16 MED ORDER — CEFAZOLIN SODIUM-DEXTROSE 2-4 GM/100ML-% IV SOLN
2.0000 g | Freq: Four times a day (QID) | INTRAVENOUS | Status: AC
  Administered 2019-02-16 – 2019-02-17 (×2): 2 g via INTRAVENOUS
  Filled 2019-02-16 (×2): qty 100

## 2019-02-16 MED ORDER — MIDAZOLAM HCL 2 MG/2ML IJ SOLN
1.0000 mg | INTRAMUSCULAR | Status: DC
  Administered 2019-02-16: 1 mg via INTRAVENOUS
  Filled 2019-02-16: qty 2

## 2019-02-16 MED ORDER — OXYCODONE HCL 5 MG PO TABS: 5.0000 mg | ORAL_TABLET | Freq: Once | ORAL | Status: DC | PRN

## 2019-02-16 MED ORDER — ROPIVACAINE HCL 5 MG/ML IJ SOLN
INTRAMUSCULAR | Status: DC | PRN
  Administered 2019-02-16: 30 mL via PERINEURAL

## 2019-02-16 MED ORDER — DEXAMETHASONE SODIUM PHOSPHATE 10 MG/ML IJ SOLN
8.0000 mg | Freq: Once | INTRAMUSCULAR | Status: AC
  Administered 2019-02-16: 8 mg via INTRAVENOUS

## 2019-02-16 MED ORDER — FENTANYL CITRATE (PF) 100 MCG/2ML IJ SOLN
25.0000 ug | INTRAMUSCULAR | Status: DC | PRN
  Administered 2019-02-16 (×2): 50 ug via INTRAVENOUS

## 2019-02-16 MED ORDER — OXYCODONE HCL 5 MG/5ML PO SOLN: 5.0000 mg | Freq: Once | ORAL | Status: DC | PRN

## 2019-02-16 MED ORDER — MORPHINE SULFATE (PF) 2 MG/ML IV SOLN: 0.5000 mg | INTRAVENOUS | Status: DC | PRN

## 2019-02-16 MED ORDER — FLEET ENEMA 7-19 GM/118ML RE ENEM: 1.0000 | ENEMA | Freq: Once | RECTAL | Status: DC | PRN

## 2019-02-16 MED ORDER — ONDANSETRON HCL 4 MG PO TABS: 4.0000 mg | ORAL_TABLET | Freq: Four times a day (QID) | ORAL | Status: DC | PRN

## 2019-02-16 MED ORDER — METHOCARBAMOL 500 MG IVPB - SIMPLE MED
500.0000 mg | Freq: Four times a day (QID) | INTRAVENOUS | Status: DC | PRN
  Filled 2019-02-16: qty 50

## 2019-02-16 MED ORDER — ACETAMINOPHEN 500 MG PO TABS
1000.0000 mg | ORAL_TABLET | Freq: Four times a day (QID) | ORAL | Status: AC
  Administered 2019-02-16: 1000 mg via ORAL
  Administered 2019-02-17: 500 mg via ORAL
  Administered 2019-02-17 (×2): 1000 mg via ORAL
  Filled 2019-02-16 (×4): qty 2

## 2019-02-16 MED ORDER — BISACODYL 10 MG RE SUPP: 10.0000 mg | Freq: Every day | RECTAL | Status: DC | PRN

## 2019-02-16 MED ORDER — TRAMADOL HCL 50 MG PO TABS
50.0000 mg | ORAL_TABLET | Freq: Four times a day (QID) | ORAL | Status: DC | PRN
  Administered 2019-02-16 – 2019-02-17 (×2): 100 mg via ORAL
  Filled 2019-02-16 (×2): qty 2

## 2019-02-16 MED ORDER — ASPIRIN EC 325 MG PO TBEC
325.0000 mg | DELAYED_RELEASE_TABLET | Freq: Two times a day (BID) | ORAL | Status: DC
  Administered 2019-02-17 – 2019-02-18 (×3): 325 mg via ORAL
  Filled 2019-02-16 (×3): qty 1

## 2019-02-16 MED ORDER — SODIUM CHLORIDE 0.9 % IR SOLN
Status: DC | PRN
  Administered 2019-02-16: 1000 mL

## 2019-02-16 MED ORDER — DIPHENHYDRAMINE HCL 12.5 MG/5ML PO ELIX: 12.5000 mg | ORAL_SOLUTION | ORAL | Status: DC | PRN

## 2019-02-16 MED ORDER — MIDAZOLAM HCL 2 MG/2ML IJ SOLN
INTRAMUSCULAR | Status: AC
  Filled 2019-02-16: qty 2

## 2019-02-16 MED ORDER — MENTHOL 3 MG MT LOZG: 1.0000 | LOZENGE | OROMUCOSAL | Status: DC | PRN

## 2019-02-16 MED ORDER — BUPIVACAINE LIPOSOME 1.3 % IJ SUSP
INTRAMUSCULAR | Status: DC | PRN
  Administered 2019-02-16: 20 mL

## 2019-02-16 MED ORDER — LACTATED RINGERS IV SOLN
INTRAVENOUS | Status: DC
  Administered 2019-02-16 (×2): via INTRAVENOUS

## 2019-02-16 MED ORDER — SODIUM CHLORIDE 0.9 % IV SOLN
INTRAVENOUS | Status: DC
  Administered 2019-02-16 – 2019-02-17 (×2): via INTRAVENOUS

## 2019-02-16 MED ORDER — POVIDONE-IODINE 10 % EX SWAB: 2.0000 "application " | Freq: Once | CUTANEOUS | Status: DC

## 2019-02-16 MED ORDER — METOCLOPRAMIDE HCL 5 MG PO TABS: 5.0000 mg | ORAL_TABLET | Freq: Three times a day (TID) | ORAL | Status: DC | PRN

## 2019-02-16 MED ORDER — CHLORHEXIDINE GLUCONATE 4 % EX LIQD: 60.0000 mL | Freq: Once | CUTANEOUS | Status: DC

## 2019-02-16 MED ORDER — ONDANSETRON HCL 4 MG/2ML IJ SOLN: 4.0000 mg | Freq: Four times a day (QID) | INTRAMUSCULAR | Status: DC | PRN

## 2019-02-16 MED ORDER — ONDANSETRON HCL 4 MG/2ML IJ SOLN
INTRAMUSCULAR | Status: AC
  Filled 2019-02-16: qty 2

## 2019-02-16 MED ORDER — 0.9 % SODIUM CHLORIDE (POUR BTL) OPTIME
TOPICAL | Status: DC | PRN
  Administered 2019-02-16: 1000 mL

## 2019-02-16 MED ORDER — METOCLOPRAMIDE HCL 5 MG/ML IJ SOLN: 5.0000 mg | Freq: Three times a day (TID) | INTRAMUSCULAR | Status: DC | PRN

## 2019-02-16 MED ORDER — ONDANSETRON HCL 4 MG/2ML IJ SOLN: 4.0000 mg | Freq: Once | INTRAMUSCULAR | Status: DC | PRN

## 2019-02-16 MED ORDER — MIDAZOLAM HCL 5 MG/5ML IJ SOLN
INTRAMUSCULAR | Status: DC | PRN
  Administered 2019-02-16 (×2): 1 mg via INTRAVENOUS

## 2019-02-16 MED ORDER — FENTANYL CITRATE (PF) 100 MCG/2ML IJ SOLN
50.0000 ug | INTRAMUSCULAR | Status: DC
  Administered 2019-02-16: 50 ug via INTRAVENOUS
  Filled 2019-02-16: qty 2

## 2019-02-16 MED ORDER — GABAPENTIN 300 MG PO CAPS
300.0000 mg | ORAL_CAPSULE | Freq: Three times a day (TID) | ORAL | Status: DC
  Administered 2019-02-16 – 2019-02-18 (×6): 300 mg via ORAL
  Filled 2019-02-16 (×6): qty 1

## 2019-02-16 MED ORDER — CLONIDINE HCL (ANALGESIA) 100 MCG/ML EP SOLN
EPIDURAL | Status: DC | PRN
  Administered 2019-02-16: 100 ug

## 2019-02-16 MED ORDER — FENTANYL CITRATE (PF) 100 MCG/2ML IJ SOLN
INTRAMUSCULAR | Status: AC
  Administered 2019-02-16: 50 ug via INTRAVENOUS
  Filled 2019-02-16: qty 2

## 2019-02-16 MED ORDER — OXYCODONE HCL 5 MG PO TABS
5.0000 mg | ORAL_TABLET | ORAL | Status: DC | PRN
  Administered 2019-02-16: 10 mg via ORAL
  Filled 2019-02-16: qty 2

## 2019-02-16 SURGICAL SUPPLY — 62 items
ATTUNE PS FEM RT SZ 5 CEM KNEE (Femur) ×1 IMPLANT
ATTUNE PSRP INSR SZ 5 10M KNEE (Insert) ×1 IMPLANT
BAG SPEC THK2 15X12 ZIP CLS (MISCELLANEOUS) ×1
BAG ZIPLOCK 12X15 (MISCELLANEOUS) ×2 IMPLANT
BANDAGE ACE 6X5 VEL STRL LF (GAUZE/BANDAGES/DRESSINGS) ×3 IMPLANT
BASEPLATE TIBIAL ROTATING SZ 4 (Knees) ×1 IMPLANT
BLADE SAG 18X100X1.27 (BLADE) ×2 IMPLANT
BLADE SAW SGTL 11.0X1.19X90.0M (BLADE) ×2 IMPLANT
BLADE SURG SZ10 CARB STEEL (BLADE) ×4 IMPLANT
BNDG CMPR 82X61 PLY HI ABS (GAUZE/BANDAGES/DRESSINGS) ×2
BNDG CONFORM 6X.82 1P STRL (GAUZE/BANDAGES/DRESSINGS) ×2 IMPLANT
BOWL SMART MIX CTS (DISPOSABLE) ×2 IMPLANT
BSPLAT TIB 4 CMNT ROT PLAT STR (Knees) ×1 IMPLANT
CEMENT HV SMART SET (Cement) ×4 IMPLANT
COVER SURGICAL LIGHT HANDLE (MISCELLANEOUS) ×2 IMPLANT
COVER WAND RF STERILE (DRAPES) IMPLANT
CUFF TOURN SGL QUICK 34 (TOURNIQUET CUFF) ×2
CUFF TRNQT CYL 34X4.125X (TOURNIQUET CUFF) ×1 IMPLANT
DECANTER SPIKE VIAL GLASS SM (MISCELLANEOUS) ×2 IMPLANT
DRAPE U-SHAPE 47X51 STRL (DRAPES) ×2 IMPLANT
DRESSING VAC ATS ABD (GAUZE/BANDAGES/DRESSINGS) ×1 IMPLANT
DRSG ADAPTIC 3X8 NADH LF (GAUZE/BANDAGES/DRESSINGS) ×2 IMPLANT
DRSG PAD ABDOMINAL 8X10 ST (GAUZE/BANDAGES/DRESSINGS) ×2 IMPLANT
DURAPREP 26ML APPLICATOR (WOUND CARE) ×2 IMPLANT
ELECT REM PT RETURN 15FT ADLT (MISCELLANEOUS) ×2 IMPLANT
EVACUATOR 1/8 PVC DRAIN (DRAIN) ×2 IMPLANT
GAUZE SPONGE 4X4 12PLY STRL (GAUZE/BANDAGES/DRESSINGS) ×2 IMPLANT
GLOVE BIO SURGEON STRL SZ7 (GLOVE) ×2 IMPLANT
GLOVE BIO SURGEON STRL SZ8 (GLOVE) ×2 IMPLANT
GLOVE BIOGEL PI IND STRL 6.5 (GLOVE) ×1 IMPLANT
GLOVE BIOGEL PI IND STRL 7.0 (GLOVE) ×1 IMPLANT
GLOVE BIOGEL PI IND STRL 8 (GLOVE) ×1 IMPLANT
GLOVE BIOGEL PI INDICATOR 6.5 (GLOVE) ×1
GLOVE BIOGEL PI INDICATOR 7.0 (GLOVE) ×1
GLOVE BIOGEL PI INDICATOR 8 (GLOVE) ×1
GLOVE SURG SS PI 6.5 STRL IVOR (GLOVE) ×2 IMPLANT
GOWN STRL REUS W/TWL LRG LVL3 (GOWN DISPOSABLE) ×6 IMPLANT
HANDPIECE INTERPULSE COAX TIP (DISPOSABLE) ×2
HOLDER FOLEY CATH W/STRAP (MISCELLANEOUS) ×1 IMPLANT
IMMOBILIZER KNEE 20 (SOFTGOODS) ×2
IMMOBILIZER KNEE 20 THIGH 36 (SOFTGOODS) ×1 IMPLANT
KIT TURNOVER KIT A (KITS) ×1 IMPLANT
MANIFOLD NEPTUNE II (INSTRUMENTS) ×2 IMPLANT
NS IRRIG 1000ML POUR BTL (IV SOLUTION) ×2 IMPLANT
PACK TOTAL KNEE CUSTOM (KITS) ×2 IMPLANT
PADDING CAST COTTON 6X4 STRL (CAST SUPPLIES) ×6 IMPLANT
PATELLA MEDIAL ATTUN 35MM KNEE (Knees) ×1 IMPLANT
PIN STEINMAN FIXATION KNEE (PIN) ×1 IMPLANT
PIN THREADED HEADED SIGMA (PIN) ×1 IMPLANT
PROTECTOR NERVE ULNAR (MISCELLANEOUS) ×2 IMPLANT
SET HNDPC FAN SPRY TIP SCT (DISPOSABLE) ×1 IMPLANT
STRIP CLOSURE SKIN 1/2X4 (GAUZE/BANDAGES/DRESSINGS) ×4 IMPLANT
SUT MNCRL AB 4-0 PS2 18 (SUTURE) ×2 IMPLANT
SUT STRATAFIX 0 PDS 27 VIOLET (SUTURE) ×2
SUT VIC AB 2-0 CT1 27 (SUTURE) ×6
SUT VIC AB 2-0 CT1 TAPERPNT 27 (SUTURE) ×3 IMPLANT
SUTURE STRATFX 0 PDS 27 VIOLET (SUTURE) ×1 IMPLANT
TAPE STRIPS DRAPE STRL (GAUZE/BANDAGES/DRESSINGS) ×1 IMPLANT
TRAY FOLEY MTR SLVR 16FR STAT (SET/KITS/TRAYS/PACK) ×2 IMPLANT
WATER STERILE IRR 1000ML POUR (IV SOLUTION) ×4 IMPLANT
WRAP KNEE MAXI GEL POST OP (GAUZE/BANDAGES/DRESSINGS) ×2 IMPLANT
YANKAUER SUCT BULB TIP 10FT TU (MISCELLANEOUS) ×2 IMPLANT

## 2019-02-16 NOTE — Anesthesia Procedure Notes (Signed)
Anesthesia Regional Block: Adductor canal block   Pre-Anesthetic Checklist: ,, timeout performed, Correct Patient, Correct Site, Correct Laterality, Correct Procedure, Correct Position, site marked, Risks and benefits discussed,  Surgical consent,  Pre-op evaluation,  At surgeon's request and post-op pain management  Laterality: Lower and Right  Prep: chloraprep       Needles:  Injection technique: Single-shot  Needle Type: Echogenic Needle     Needle Length: 9cm  Needle Gauge: 22     Additional Needles:   Procedures:,,,, ultrasound used (permanent image in chart),,,,  Narrative:  Start time: 02/16/2019 9:48 AM End time: 02/16/2019 9:56 AM Injection made incrementally with aspirations every 5 mL.  Performed by: Personally  Anesthesiologist: Barnet Glasgow, MD  Additional Notes: Block assessed prior to surgery. Pt tolerated procedure well.

## 2019-02-16 NOTE — Discharge Instructions (Signed)
° °Dr. Frank Pierce °Total Joint Specialist °[facility] °[address] °[place], [place] [address] °[phone] ° °TOTAL KNEE REPLACEMENT POSTOPERATIVE DIRECTIONS ° °Knee Rehabilitation, Guidelines Following Surgery  °Results after knee surgery are often greatly improved when you follow the exercise, range of motion and muscle strengthening exercises prescribed by your doctor. Safety measures are also important to protect the knee from further injury. Any time any of these exercises cause you to have increased pain or swelling in your knee joint, decrease the amount until you are comfortable again and slowly increase them. If you have problems or questions, call your caregiver or physical therapist for advice.  ° °HOME CARE INSTRUCTIONS  °• Remove items at home which could result in a fall. This includes throw rugs or furniture in walking pathways.  °· ICE to the affected knee every three hours for 30 minutes at a time and then as needed for pain and swelling.  Continue to use ice on the knee for pain and swelling from surgery. You may notice swelling that will progress down to the foot and ankle.  This is normal after surgery.  Elevate the leg when you are not up walking on it.   °· Continue to use the breathing machine which will help keep your temperature down.  It is common for your temperature to cycle up and down following surgery, especially at night when you are not up moving around and exerting yourself.  The breathing machine keeps your lungs expanded and your temperature down. °· Do not place pillow under knee, focus on keeping the knee straight while resting ° °DIET °You may resume your previous home diet once your are discharged from the hospital. ° °DRESSING / WOUND CARE / SHOWERING °You may change your dressing 3-5 days after surgery.  Then change the dressing every day with sterile gauze.  Please use good hand washing techniques before changing the dressing.  Do not use any lotions  or creams on the incision until instructed by your surgeon. °You may start showering once you are discharged home but do not submerge the incision under water. Just pat the incision dry and apply a dry gauze dressing on daily. °Change the surgical dressing daily and reapply a dry dressing each time. ° °ACTIVITY °Walk with your walker as instructed. °Use walker as long as suggested by your caregivers. °Avoid periods of inactivity such as sitting longer than an hour when not asleep. This helps prevent blood clots.  °You may resume a sexual relationship in one month or when given the OK by your doctor.  °You may return to work once you are cleared by your doctor.  °Do not drive a car for 6 weeks or until released by you surgeon.  °Do not drive while taking narcotics. ° °WEIGHT BEARING °Weight bearing as tolerated with assist device (walker, cane, etc) as directed, use it as long as suggested by your surgeon or therapist, typically at least 4-6 weeks. ° °POSTOPERATIVE CONSTIPATION PROTOCOL °Constipation - defined medically as fewer than three stools per week and severe constipation as less than one stool per week. ° °One of the most common issues patients have following surgery is constipation.  Even if you have a regular bowel pattern at home, your normal regimen is likely to be disrupted due to multiple reasons following surgery.  Combination of anesthesia, postoperative narcotics, change in appetite and fluid intake all can affect your bowels.  In order to avoid complications following surgery, here are some   recommendations in order to help you during your recovery period. ° °Colace (docusate) - Pick up an over-the-counter form of Colace or another stool softener and take twice a day as long as you are requiring postoperative pain medications.  Take with a full glass of water daily.  If you experience loose stools or diarrhea, hold the colace until you stool forms back up.  If your symptoms do not get better within 1  week or if they get worse, check with your doctor. ° °Dulcolax (bisacodyl) - Pick up over-the-counter and take as directed by the product packaging as needed to assist with the movement of your bowels.  Take with a full glass of water.  Use this product as needed if not relieved by Colace only.  ° °MiraLax (polyethylene glycol) - Pick up over-the-counter to have on hand.  MiraLax is a solution that will increase the amount of water in your bowels to assist with bowel movements.  Take as directed and can mix with a glass of water, juice, soda, coffee, or tea.  Take if you go more than two days without a movement. °Do not use MiraLax more than once per day. Call your doctor if you are still constipated or irregular after using this medication for 7 days in a row. ° °If you continue to have problems with postoperative constipation, please contact the office for further assistance and recommendations.  If you experience "the worst abdominal pain ever" or develop nausea or vomiting, please contact the office immediatly for further recommendations for treatment. ° °ITCHING °If you experience itching with your medications, try taking only a single pain pill, or even half a pain pill at a time.  You can also use Benadryl over the counter for itching or also to help with sleep.  ° °TED HOSE STOCKINGS °Wear the elastic stockings on both legs for three weeks following surgery during the day but you may remove then at night for sleeping. ° °MEDICATIONS °See your medication summary on the “After Visit Summary” that the nursing staff will review with you prior to discharge.  You may have some home medications which will be placed on hold until you complete the course of blood thinner medication.  It is important for you to complete the blood thinner medication as prescribed by your surgeon.  Continue your approved medications as instructed at time of discharge. ° °PRECAUTIONS °If you experience chest pain or shortness of breath -  call 911 immediately for transfer to the hospital emergency department.  °If you develop a fever greater that 101 F, purulent drainage from wound, increased redness or drainage from wound, foul odor from the wound/dressing, or calf pain - CONTACT YOUR SURGEON.   °                                                °FOLLOW-UP APPOINTMENTS °Make sure you keep all of your appointments after your operation with your surgeon and caregivers. You should call the office at the above phone number and make an appointment for approximately two weeks after the date of your surgery or on the date instructed by your surgeon outlined in the "After Visit Summary". ° °RANGE OF MOTION AND STRENGTHENING EXERCISES  °Rehabilitation of the knee is important following a knee injury or an operation. After just a few days of immobilization, the muscles of   the thigh which control the knee become weakened and shrink (atrophy). Knee exercises are designed to build up the tone and strength of the thigh muscles and to improve knee motion. Often times heat used for twenty to thirty minutes before working out will loosen up your tissues and help with improving the range of motion but do not use heat for the first two weeks following surgery. These exercises can be done on a training (exercise) mat, on the floor, on a table or on a bed. Use what ever works the best and is most comfortable for you Knee exercises include:  °• Leg Lifts - While your knee is still immobilized in a splint or cast, you can do straight leg raises. Lift the leg to 60 degrees, hold for 3 sec, and slowly lower the leg. Repeat 10-20 times 2-3 times daily. Perform this exercise against resistance later as your knee gets better.  °• Quad and Hamstring Sets - Tighten up the muscle on the front of the thigh (Quad) and hold for 5-10 sec. Repeat this 10-20 times hourly. Hamstring sets are done by pushing the foot backward against an object and holding for 5-10 sec. Repeat as with quad  sets.  °· Leg Slides: Lying on your back, slowly slide your foot toward your buttocks, bending your knee up off the floor (only go as far as is comfortable). Then slowly slide your foot back down until your leg is flat on the floor again. °· Angel Wings: Lying on your back spread your legs to the side as far apart as you can without causing discomfort.  °A rehabilitation program following serious knee injuries can speed recovery and prevent re-injury in the future due to weakened muscles. Contact your doctor or a physical therapist for more information on knee rehabilitation.  ° °IF YOU ARE TRANSFERRED TO A SKILLED REHAB FACILITY °If the patient is transferred to a skilled rehab facility following release from the hospital, a list of the current medications will be sent to the facility for the patient to continue.  When discharged from the skilled rehab facility, please have the facility set up the patient's Home Health Physical Therapy prior to being released. Also, the skilled facility will be responsible for providing the patient with their medications at time of release from the facility to include their pain medication, the muscle relaxants, and their blood thinner medication. If the patient is still at the rehab facility at time of the two week follow up appointment, the skilled rehab facility will also need to assist the patient in arranging follow up appointment in our office and any transportation needs. ° °MAKE SURE YOU:  °• Understand these instructions.  °• Get help right away if you are not doing well or get worse.  ° ° °Pick up stool softner and laxative for home use following surgery while on pain medications. °Do not submerge incision under water. °Please use good hand washing techniques while changing dressing each day. °May shower starting three days after surgery. °Please use a clean towel to pat the incision dry following showers. °Continue to use ice for pain and swelling after surgery. °Do not  use any lotions or creams on the incision until instructed by your surgeon. ° °

## 2019-02-16 NOTE — Anesthesia Procedure Notes (Signed)
Date/Time: 02/16/2019 11:07 AM Performed by: Glory Buff, CRNA Oxygen Delivery Method: Nasal cannula

## 2019-02-16 NOTE — Evaluation (Signed)
Physical Therapy Evaluation Patient Details Name: [name] MRN: [id number] DOB: 03-21-1951 Today's Date: 02/16/2019   History of Present Illness  68 yo female s/p R TKR on 02/16/19. PMH includes HTN, HLD, anxiety, OA, L knee arthroscopy.   Clinical Impression  Pt presents with R knee pain, decreased R knee ROM, increased time and effort to perform mobility tasks, and decreased activity tolerance. Pt to benefit from acute PT to address deficits. Pt ambulated hallway distance with RW with min guard assist, verbal cuing for form and safety provided. Pt educated on ankle pumps (20/hour) to perform this afternoon/evening to increase circulation, to pt's tolerance and limited by pain. PT to progress mobility as tolerated, and will continue to follow acutely.        Follow Up Recommendations Follow surgeon's recommendation for DC plan and follow-up therapies;Supervision for mobility/OOB(OPPT)    Equipment Recommendations  None recommended by PT    Recommendations for Other Services       Precautions / Restrictions Precautions Precautions: Fall Required Braces or Orthoses: Knee Immobilizer - Right Knee Immobilizer - Right: On when out of bed or walking;Discontinue once straight leg raise with < 10 degree lag Restrictions Weight Bearing Restrictions: No Other Position/Activity Restrictions: WBAT      Mobility  Bed Mobility Overal bed mobility: Needs Assistance Bed Mobility: Supine to Sit     Supine to sit: Min assist;HOB elevated     General bed mobility comments: Min assist for RLE management, increased time and effort to come to sitting.   Transfers Overall transfer level: Needs assistance Equipment used: Rolling walker (2 wheeled) Transfers: Sit to/from Stand Sit to Stand: Min assist         General transfer comment: Min assist for power up, pt with self-steadying upon standing. Verbal cuing for hand placement when rising.   Ambulation/Gait Ambulation/Gait  assistance: Min guard;+2 safety/equipment Gait Distance (Feet): 120 Feet Assistive device: Rolling walker (2 wheeled) Gait Pattern/deviations: Step-to pattern;Step-through pattern;Decreased stride length;Trunk flexed Gait velocity: decr    General Gait Details: Min guard for safety, verbal cuing for upright posture, sequencing although pt quickly progressing to step-through gait.   Stairs            Wheelchair Mobility    Modified Rankin (Stroke Patients Only)       Balance Overall balance assessment: Mild deficits observed, not formally tested                                           Pertinent Vitals/Pain Pain Assessment: 0-10 Pain Score: 1  Pain Location: R knee  Pain Descriptors / Indicators: Sore Pain Intervention(s): Repositioned;Monitored during session;Limited activity within patient's tolerance;Ice applied    Home Living Family/patient expects to be discharged to:: Private residence Living Arrangements: Spouse/significant other Available Help at Discharge: Family;Available 24 hours/day Type of Home: House Home Access: Stairs to enter Entrance Stairs-Rails: Left Entrance Stairs-Number of Steps: 3 Home Layout: One level Home Equipment: Bedside commode;Walker - 2 wheels;Cane - single point      Prior Function Level of Independence: Independent               Hand Dominance   Dominant Hand: Right    Extremity/Trunk Assessment   Upper Extremity Assessment Upper Extremity Assessment: Overall WFL for tasks assessed    Lower Extremity Assessment Lower Extremity Assessment: Overall WFL for tasks assessed;RLE deficits/detail RLE Deficits /  Details: suspected post-surgical weakness; able to perform ankle pumps, quad set, heel slide to 70*, SLR with quad lag >10* RLE Sensation: WNL    Cervical / Trunk Assessment Cervical / Trunk Assessment: Normal  Communication   Communication: No difficulties  Cognition Arousal/Alertness:  Awake/alert Behavior During Therapy: WFL for tasks assessed/performed Overall Cognitive Status: Within Functional Limits for tasks assessed                                        General Comments      Exercises     Assessment/Plan    PT Assessment Patient needs continued PT services  PT Problem List Decreased strength;Decreased mobility;Decreased safety awareness;Decreased range of motion;Decreased activity tolerance;Decreased knowledge of use of DME;Decreased balance;Pain       PT Treatment Interventions DME instruction;Functional mobility training;Balance training;Patient/family education;Gait training;Therapeutic activities;Stair training;Therapeutic exercise    PT Goals (Current goals can be found in the Care Plan section)  Acute Rehab PT Goals Patient Stated Goal: go home ASAP PT Goal Formulation: With patient Time For Goal Achievement: 02/23/19 Potential to Achieve Goals: Good    Frequency 7X/week   Barriers to discharge        Co-evaluation               AM-PAC PT "6 Clicks" Mobility  Outcome Measure Help needed turning from your back to your side while in a flat bed without using bedrails?: A Little Help needed moving from lying on your back to sitting on the side of a flat bed without using bedrails?: A Little Help needed moving to and from a bed to a chair (including a wheelchair)?: A Little Help needed standing up from a chair using your arms (e.g., wheelchair or bedside chair)?: A Little Help needed to walk in hospital room?: A Little Help needed climbing 3-5 steps with a railing? : A Little 6 Click Score: 18    End of Session Equipment Utilized During Treatment: Gait belt;Right knee immobilizer Activity Tolerance: Patient tolerated treatment well Patient left: in chair;with chair alarm set;with call bell/phone within reach;with family/visitor present;with SCD's reapplied Nurse Communication: Mobility status PT Visit Diagnosis:  Other abnormalities of gait and mobility (R26.89);Difficulty in walking, not elsewhere classified (R26.2)    Time: 1930-1950 PT Time Calculation (min) (ACUTE ONLY): 20 min   Charges:   PT Evaluation $PT Eval Low Complexity: 1 Low          Amine Adelson Conception Chancy, PT Acute Rehabilitation Services Pager [phone]  Office [phone]   Leatha Rohner D Elonda Husky 02/16/2019, 7:55 PM

## 2019-02-16 NOTE — Anesthesia Procedure Notes (Signed)
Spinal  Patient location during procedure: OR Start time: 02/16/2019 11:12 AM End time: 02/16/2019 11:16 AM Staffing Anesthesiologist: Barnet Glasgow, MD Resident/CRNA: Glory Buff, CRNA Performed: resident/CRNA  Preanesthetic Checklist Completed: patient identified, site marked, surgical consent, pre-op evaluation, timeout performed, IV checked, risks and benefits discussed and monitors and equipment checked Spinal Block Patient position: sitting Prep: DuraPrep Patient monitoring: heart rate, cardiac monitor, continuous pulse ox and blood pressure Approach: midline Location: L2-3 Injection technique: single-shot Needle Needle type: Pencan  Needle gauge: 24 G Needle length: 9 cm Assessment Sensory level: T4 Additional Notes Kit expiration date checked and verified.  Sterile prep and drape, skin local with 1% lidocaine, - paraesthesia, - heme, +CSF pre and post injection, patient tolerated well.

## 2019-02-16 NOTE — Transfer of Care (Signed)
Immediate Anesthesia Transfer of Care Note  Patient: [name]  Procedure(s) Performed: TOTAL KNEE ARTHROPLASTY (Right )  Patient Location: PACU  Anesthesia Type:MAC and Spinal  Level of Consciousness: awake, alert  and oriented  Airway & Oxygen Therapy: Patient Spontanous Breathing and Patient connected to nasal cannula oxygen  Post-op Assessment: Report given to RN and Post -op Vital signs reviewed and stable  Post vital signs: Reviewed and stable  Last Vitals:  Vitals Value Taken Time  BP    Temp    Pulse 77 02/16/2019 12:35 PM  Resp 16 02/16/2019 12:35 PM  SpO2 98 % 02/16/2019 12:35 PM  Vitals shown include unvalidated device data.  Last Pain:  Vitals:   02/16/19 0926  TempSrc:   PainSc: 3       Patients Stated Pain Goal: 4 (87/[id number]/43 [id number])  Complications: No apparent anesthesia complications

## 2019-02-16 NOTE — Progress Notes (Signed)
AssistedDr. Houser with right, ultrasound guided, adductor canal block. Side rails up, monitors on throughout procedure. See vital signs in flow sheet. Tolerated Procedure well.  

## 2019-02-16 NOTE — Interval H&P Note (Signed)
History and Physical Interval Note:  02/16/2019 9:11 AM  [name]  has presented today for surgery, with the diagnosis of right knee osteoarthritis.  The various methods of treatment have been discussed with the patient and family. After consideration of risks, benefits and other options for treatment, the patient has consented to  Procedure(s) with comments: TOTAL KNEE ARTHROPLASTY (Right) - 35min as a surgical intervention.  The patient's history has been reviewed, patient examined, no change in status, stable for surgery.  I have reviewed the patient's chart and labs.  Questions were answered to the patient's satisfaction.     Pilar Plate Starr Engel

## 2019-02-16 NOTE — Op Note (Signed)
OPERATIVE REPORT-TOTAL KNEE ARTHROPLASTY   Pre-operative diagnosis- Osteoarthritis  Right knee(s)  Post-operative diagnosis- Osteoarthritis Right knee(s)  Procedure-  Right  Total Knee Arthroplasty  Surgeon- Andrea Plover. Apolo Cutshaw, MD  Assistant- Ardeen Jourdain, PA-C   Anesthesia-  Adductor canal block and spinal  EBL-25 mL   Drains Hemovac  Tourniquet time-  Total Tourniquet Time Documented: Thigh (Right) - 35 minutes Total: Thigh (Right) - 35 minutes     Complications- None  Condition-PACU - hemodynamically stable.   Brief Clinical Note  [name] is a 68 y.o. year old female with end stage OA of her right knee with progressively worsening pain and dysfunction. She has constant pain, with activity and at rest and significant functional deficits with difficulties even with ADLs. She has had extensive non-op management including analgesics, injections of cortisone and viscosupplements, and home exercise program, but remains in significant pain with significant dysfunction.Radiographs show bone on bone arthritis medial and patellofemoral. She presents now for right Total Knee Arthroplasty.    Procedure in detail---   The patient is brought into the operating room and positioned supine on the operating table. After successful administration of  Adductor canal block and spinal,   a tourniquet is placed high on the  Right thigh(s) and the lower extremity is prepped and draped in the usual sterile fashion. Time out is performed by the operating team and then the  Right lower extremity is wrapped in Esmarch, knee flexed and the tourniquet inflated to 300 mmHg.       A midline incision is made with a ten blade through the subcutaneous tissue to the level of the extensor mechanism. A fresh blade is used to make a medial parapatellar arthrotomy. Soft tissue over the proximal medial tibia is subperiosteally elevated to the joint line with a knife and into the semimembranosus bursa with a  Cobb elevator. Soft tissue over the proximal lateral tibia is elevated with attention being paid to avoiding the patellar tendon on the tibial tubercle. The patella is everted, knee flexed 90 degrees and the ACL and PCL are removed. Findings are bone on bone all 3 compartments with massive osteophytes and significant synovitis.        The drill is used to create a starting hole in the distal femur and the canal is thoroughly irrigated with sterile saline to remove the fatty contents. The 5 degree Right  valgus alignment guide is placed into the femoral canal and the distal femoral cutting block is pinned to remove 9 mm off the distal femur. Resection is made with an oscillating saw.      The tibia is subluxed forward and the menisci are removed. The extramedullary alignment guide is placed referencing proximally at the medial aspect of the tibial tubercle and distally along the second metatarsal axis and tibial crest. The block is pinned to remove 73mm off the more deficient medial  side. Resection is made with an oscillating saw. Size 4is the most appropriate size for the tibia and the proximal tibia is prepared with the modular drill and keel punch for that size.      The femoral sizing guide is placed and size 5 is most appropriate. Rotation is marked off the epicondylar axis and confirmed by creating a rectangular flexion gap at 90 degrees. The size 5 cutting block is pinned in this rotation and the anterior, posterior and chamfer cuts are made with the oscillating saw. The intercondylar block is then placed and that cut is made.  Trial size 4 tibial component, trial size 5 posterior stabilized femur and a 10  mm posterior stabilized rotating platform insert trial is placed. Full extension is achieved with excellent varus/valgus and anterior/posterior balance throughout full range of motion. The patella is everted and thickness measured to be 22  mm. Free hand resection is taken to 12 mm, a 35 template is  placed, lug holes are drilled, trial patella is placed, and it tracks normally. Osteophytes are removed off the posterior femur with the trial in place. All trials are removed and the cut bone surfaces prepared with pulsatile lavage. Cement is mixed and once ready for implantation, the size 4 tibial implant, size  5 posterior stabilized femoral component, and the size 35 patella are cemented in place and the patella is held with the clamp. The trial insert is placed and the knee held in full extension. The Exparel (20 ml mixed with 60 ml saline) is injected into the extensor mechanism, posterior capsule, medial and lateral gutters and subcutaneous tissues.  All extruded cement is removed and once the cement is hard the permanent 10 mm posterior stabilized rotating platform insert is placed into the tibial tray.      The wound is copiously irrigated with saline solution and the extensor mechanism closed over a hemovac drain with #1 V-loc suture. The tourniquet is released for a total tourniquet time of 35  minutes. Flexion against gravity is 140 degrees and the patella tracks normally. Subcutaneous tissue is closed with 2.0 vicryl and subcuticular with running 4.0 Monocryl. The incision is cleaned and dried and steri-strips and a bulky sterile dressing are applied. The limb is placed into a knee immobilizer and the patient is awakened and transported to recovery in stable condition.      Please note that a surgical assistant was a medical necessity for this procedure in order to perform it in a safe and expeditious manner. Surgical assistant was necessary to retract the ligaments and vital neurovascular structures to prevent injury to them and also necessary for proper positioning of the limb to allow for anatomic placement of the prosthesis.   Andrea Plover Andrea Almanzar, MD    02/16/2019, 12:19 PM

## 2019-02-16 NOTE — Anesthesia Postprocedure Evaluation (Signed)
Anesthesia Post Note  Patient: [name]  Procedure(s) Performed: TOTAL KNEE ARTHROPLASTY (Right )     Patient location during evaluation: Nursing Unit Anesthesia Type: Spinal Level of consciousness: oriented and awake and alert Pain management: pain level controlled Vital Signs Assessment: post-procedure vital signs reviewed and stable Respiratory status: spontaneous breathing and respiratory function stable Cardiovascular status: blood pressure returned to baseline and stable Postop Assessment: no headache, no backache, no apparent nausea or vomiting and patient able to bend at knees Anesthetic complications: no    Last Vitals:  Vitals:   02/16/19 1330 02/16/19 1345  BP: 96/66 120/70  Pulse: (!) 42 (!) 48  Resp: 10 10  Temp:    SpO2: 100% 100%    Last Pain:  Vitals:   02/16/19 1245  TempSrc:   PainSc: 10-Worst pain ever                 Barnet Glasgow

## 2019-02-17 ENCOUNTER — Encounter (HOSPITAL_COMMUNITY): Payer: Self-pay | Admitting: Orthopedic Surgery

## 2019-02-17 DIAGNOSIS — Z9851 Tubal ligation status: Secondary | ICD-10-CM | POA: Diagnosis not present

## 2019-02-17 DIAGNOSIS — E785 Hyperlipidemia, unspecified: Secondary | ICD-10-CM | POA: Diagnosis present

## 2019-02-17 DIAGNOSIS — Z6831 Body mass index (BMI) 31.0-31.9, adult: Secondary | ICD-10-CM | POA: Diagnosis not present

## 2019-02-17 DIAGNOSIS — Z8249 Family history of ischemic heart disease and other diseases of the circulatory system: Secondary | ICD-10-CM | POA: Diagnosis not present

## 2019-02-17 DIAGNOSIS — Z791 Long term (current) use of non-steroidal anti-inflammatories (NSAID): Secondary | ICD-10-CM | POA: Diagnosis not present

## 2019-02-17 DIAGNOSIS — M1711 Unilateral primary osteoarthritis, right knee: Secondary | ICD-10-CM | POA: Diagnosis present

## 2019-02-17 DIAGNOSIS — M659 Synovitis and tenosynovitis, unspecified: Secondary | ICD-10-CM | POA: Diagnosis present

## 2019-02-17 DIAGNOSIS — I10 Essential (primary) hypertension: Secondary | ICD-10-CM | POA: Diagnosis present

## 2019-02-17 DIAGNOSIS — M25761 Osteophyte, right knee: Secondary | ICD-10-CM | POA: Diagnosis present

## 2019-02-17 DIAGNOSIS — Z87891 Personal history of nicotine dependence: Secondary | ICD-10-CM | POA: Diagnosis not present

## 2019-02-17 DIAGNOSIS — Z7982 Long term (current) use of aspirin: Secondary | ICD-10-CM | POA: Diagnosis not present

## 2019-02-17 DIAGNOSIS — Z800 Family history of malignant neoplasm of digestive organs: Secondary | ICD-10-CM | POA: Diagnosis not present

## 2019-02-17 DIAGNOSIS — E669 Obesity, unspecified: Secondary | ICD-10-CM | POA: Diagnosis present

## 2019-02-17 DIAGNOSIS — Z79899 Other long term (current) drug therapy: Secondary | ICD-10-CM | POA: Diagnosis not present

## 2019-02-17 LAB — BASIC METABOLIC PANEL
Anion gap: 7 (ref 5–15)
BUN: 12 mg/dL (ref 8–23)
CO2: 26 mmol/L (ref 22–32)
Calcium: 8.4 mg/dL — ABNORMAL LOW (ref 8.9–10.3)
Chloride: 105 mmol/L (ref 98–111)
Creatinine, Ser: 0.56 mg/dL (ref 0.44–1.00)
GFR calc Af Amer: >60 mL/min (ref >60)
GFR calc non Af Amer: >60 mL/min (ref >60)
Glucose, Bld: 131 mg/dL — ABNORMAL HIGH (ref 70–99)
Potassium: 3.7 mmol/L (ref 3.5–5.1)
Sodium: 138 mmol/L (ref 135–145)

## 2019-02-17 LAB — CBC
HCT: 38.2 % (ref 36.0–46.0)
Hemoglobin: 12 g/dL (ref 12.0–15.0)
MCH: 28.6 pg (ref 26.0–34.0)
MCHC: 31.4 g/dL (ref 30.0–36.0)
MCV: 91.2 fL (ref 80.0–100.0)
Platelets: 234 10*3/uL (ref 150–400)
RBC: 4.19 MIL/uL (ref 3.87–5.11)
RDW: 13.4 % (ref 11.5–15.5)
WBC: 11.9 10*3/uL — ABNORMAL HIGH (ref 4.0–10.5)
nRBC: 0 % (ref 0.0–0.2)

## 2019-02-17 MED ORDER — TRAMADOL HCL 50 MG PO TABS: 50.0000 mg | ORAL_TABLET | Freq: Four times a day (QID) | ORAL | 0 refills | Status: DC | PRN

## 2019-02-17 MED ORDER — GABAPENTIN 300 MG PO CAPS: 300.0000 mg | ORAL_CAPSULE | Freq: Three times a day (TID) | ORAL | 0 refills | Status: DC

## 2019-02-17 MED ORDER — METHOCARBAMOL 500 MG PO TABS: 500.0000 mg | ORAL_TABLET | Freq: Four times a day (QID) | ORAL | 0 refills | Status: DC | PRN

## 2019-02-17 MED ORDER — HYDROCODONE-ACETAMINOPHEN 5-325 MG PO TABS: 1.0000 | ORAL_TABLET | Freq: Four times a day (QID) | ORAL | 0 refills | Status: DC | PRN

## 2019-02-17 MED ORDER — HYDROCODONE-ACETAMINOPHEN 5-325 MG PO TABS
1.0000 | ORAL_TABLET | ORAL | Status: DC | PRN
  Administered 2019-02-17 (×2): 2 via ORAL
  Administered 2019-02-17 (×2): 1 via ORAL
  Administered 2019-02-18: 2 via ORAL
  Filled 2019-02-17: qty 2
  Filled 2019-02-17 (×2): qty 1
  Filled 2019-02-17 (×2): qty 2

## 2019-02-17 MED ORDER — ASPIRIN 325 MG PO TBEC: 325.0000 mg | DELAYED_RELEASE_TABLET | Freq: Two times a day (BID) | ORAL | 0 refills | Status: AC

## 2019-02-17 NOTE — Progress Notes (Signed)
Physical Therapy Treatment Patient Details Name: [name] MRN: [id number] DOB: [birth year] Today's Date: 02/17/2019    History of Present Illness 68 yo female s/p R TKR on 02/16/19. PMH includes HTN, HLD, anxiety, OA, L knee arthroscopy.     PT Comments    Pt limited by pain this session, RN aware and pre-medicated to assist.  Pt able to increased cadence and demonstrate good heel strike and equal stride length with gait, used RW for support with min guard no LOB.  Reviewed exercises to complete at home, pt able to demonstrate without cueing required.  Improved ROM with  AROM 10-76* Rt knee.  EOS pt left in bed with knee immobilizer and ice on knee.  Reports ice is helping to reduce pain.    Follow Up Recommendations  Follow surgeon's recommendation for DC plan and follow-up therapies;Supervision for mobility/OOB(OPPT)     Equipment Recommendations  None recommended by PT    Recommendations for Other Services       Precautions / Restrictions Precautions Precautions: Fall Required Braces or Orthoses: Knee Immobilizer - Right Knee Immobilizer - Right: On when out of bed or walking;Discontinue once straight leg raise with < 10 degree lag Restrictions Weight Bearing Restrictions: No Other Position/Activity Restrictions: WBAT    Mobility  Bed Mobility Overal bed mobility: Modified Independent Bed Mobility: Sit to Supine           General bed mobility comments: Increased time, AAROM with opposite LE to assist with return to bed  Transfers Overall transfer level: Modified independent Equipment used: Rolling walker (2 wheeled) Transfers: Sit to/from Stand Sit to Stand: Min guard         General transfer comment: Min guard for safety.  Pt able to demonstrate appropriate mechanics for safe sit to stand  Ambulation/Gait Ambulation/Gait assistance: Min guard Gait Distance (Feet): 200 Feet Assistive device: Rolling walker (2 wheeled) Gait Pattern/deviations: Step-to  pattern;Step-through pattern;Decreased stride length;Trunk flexed Gait velocity: decr    General Gait Details: Min guard for safety.  Min cueing for equal stride length and to push RW further, safe mechanics wiht step-through gait   Stairs Stairs: Yes Stairs assistance: Min assist Stair Management: One rail Right Number of Stairs: 3(6in step height, 2 sets) General stair comments: step to pattern, able to verbalize and demonstrate safe mechanics   Wheelchair Mobility    Modified Rankin (Stroke Patients Only)       Balance                                            Cognition Arousal/Alertness: Awake/alert Behavior During Therapy: WFL for tasks assessed/performed Overall Cognitive Status: Within Functional Limits for tasks assessed                                        Exercises Total Joint Exercises Ankle Circles/Pumps: Both;20 reps;Seated Quad Sets: 10 reps;Right;Supine Short Arc QuadSinclair Ship;Right;5 reps;Supine(towel rolled under knee for exercise, 2 sets) Heel Slides: Right;10 reps;Seated Hip ABduction/ADduction: 10 reps;Both;Strengthening;Supine(recliner extended) Long Arc Quad: Right;5 reps;Strengthening;AROM Goniometric ROM: AROM 10-76* Rt knee    General Comments        Pertinent Vitals/Pain Pain Assessment: 0-10 Pain Score: 8  Pain Location: R knee  Pain Descriptors / Indicators: Aching;Sore Pain Intervention(s): Premedicated before session;Monitored during  session;Limited activity within patient's tolerance;Ice applied    Home Living                      Prior Function            PT Goals (current goals can now be found in the care plan section)      Frequency    7X/week      PT Plan Current plan remains appropriate    Co-evaluation              AM-PAC PT "6 Clicks" Mobility   Outcome Measure  Help needed turning from your back to your side while in a flat bed without using bedrails?:  A Little Help needed moving from lying on your back to sitting on the side of a flat bed without using bedrails?: A Little Help needed moving to and from a bed to a chair (including a wheelchair)?: A Little Help needed standing up from a chair using your arms (e.g., wheelchair or bedside chair)?: A Little Help needed to walk in hospital room?: A Little Help needed climbing 3-5 steps with a railing? : A Little 6 Click Score: 18    End of Session Equipment Utilized During Treatment: Gait belt;Right knee immobilizer Activity Tolerance: Patient tolerated treatment well Patient left: in bed;with call bell/phone within reach(RN aware of status, ice applied to knee for pain control) Nurse Communication: Mobility status PT Visit Diagnosis: Other abnormalities of gait and mobility (R26.89);Difficulty in walking, not elsewhere classified (R26.2)     Time: 2957-4734 PT Time Calculation (min) (ACUTE ONLY): 38 min  Charges:  $Gait Training: 8-22 mins $Therapeutic Exercise: 23-37 mins                     [address] [place]; [place]  Aldona Lento 02/17/2019, 2:43 PM

## 2019-02-17 NOTE — Progress Notes (Signed)
hemovac site draining through ace wrap. Dressing changed had been previously reinforced while working with PT  D Mateo Flow RN

## 2019-02-17 NOTE — Progress Notes (Signed)
   Subjective: 1 Day Post-Op Procedure(s) (LRB): TOTAL KNEE ARTHROPLASTY (Right) Patient reports pain as mild.   Patient seen in rounds by Dr. Wynelle Link. Patient is well, and has had no acute complaints or problems. States knee is feeling well this AM. Denies chest pain, SOB, or calf pain. Foley catheter removed this AM. No issues overnight.  We will continue therapy today.   Objective: Vital signs in last 24 hours: Temp:  [97.4 F (36.3 C)-98.3 F (36.8 C)] 97.5 F (36.4 C) (06/02 0518) Pulse Rate:  [42-78] 53 (06/02 0518) Resp:  [10-18] 16 (06/02 0518) BP: (93-150)/(62-89) 115/76 (06/02 0518) SpO2:  [96 %-100 %] 98 % (06/02 0518) Weight:  [96.2 kg] 96.2 kg (06/01 0926)  Intake/Output from previous day:  Intake/Output Summary (Last 24 hours) at 02/17/2019 0722 Last data filed at 02/17/2019 0520 Gross per 24 hour  Intake 3326.58 ml  Output 1060 ml  Net 2266.58 ml    Labs: Recent Labs    02/17/19 0413  HGB 12.0   Recent Labs    02/17/19 0413  WBC 11.9*  RBC 4.19  HCT 38.2  PLT 234   Recent Labs    02/17/19 0413  NA 138  K 3.7  CL 105  CO2 26  BUN 12  CREATININE 0.56  GLUCOSE 131*  CALCIUM 8.4*   Exam: General - Patient is Alert and Oriented Extremity - Neurologically intact Neurovascular intact Sensation intact distally Dorsiflexion/Plantar flexion intact Dressing - dressing C/D/I Motor Function - intact, moving foot and toes well on exam.   Past Medical History:  Diagnosis Date  . Anxiety   . Arthritis    knees   . GERD (gastroesophageal reflux disease)   . Heart murmur    hx of in past  . Hyperlipidemia   . Hypertension   . Microscopic hematuria   . Obesity     Assessment/Plan: 1 Day Post-Op Procedure(s) (LRB): TOTAL KNEE ARTHROPLASTY (Right) Principal Problem:   OA (osteoarthritis) of knee Active Problems:   Osteoarthritis of right knee  Estimated body mass index is 31.31 kg/m as calculated from the following:   Height as of this  encounter: 5\' 9"  (1.753 m).   Weight as of this encounter: 96.2 kg. Advance diet Up with therapy D/C IV fluids  Anticipated LOS equal to or greater than 2 midnights due to - Age 68 and older with one or more of the following:  - Obesity  - Expected need for hospital services (PT, OT, Nursing) required for safe  discharge  - Anticipated need for postoperative skilled nursing care or inpatient rehab  - Active co-morbidities: None OR   - Unanticipated findings during/Post Surgery: None  - Patient is a high risk of re-admission due to: None    DVT Prophylaxis - Aspirin Weight bearing as tolerated. D/C O2 and pulse ox and try on room air. Hemovac pulled without difficulty, will continue therapy today.  Only requiring tramadol for pain at this point, anticipate increase in pain and need for medication once anesthetic block wears off. Pt states she had an intolerance to oxycodone in the past, switched oral pain medication to hydrocodone.   Plan is to go Home after hospital stay. Plan for discharge later today if pain is well controlled and she is meeting her goals with physical therapy. Scheduled for outpatient PT at [facility] Follow-up in the office in 2 weeks.   Theresa Duty, PA-C Orthopedic Surgery 02/17/2019, 7:22 AM

## 2019-02-17 NOTE — Care Management CC44 (Signed)
Condition Code 44 Documentation Completed  Patient Details  Name: DEIRA SHIMER MRN: [id number] Date of Birth: 01/20/1951   Condition Code 44 given:  Yes Patient signature on Condition Code 44 notice:  Yes Documentation of 2 MD's agreement:  Yes Code 44 added to claim:  Yes    Leeroy Cha, RN 02/17/2019, 10:13 AM

## 2019-02-17 NOTE — Progress Notes (Signed)
Patient was a possible discharge for this afternoon, however she is having difficulty managing pain with oral medications per Butler Denmark, RN. Will keep overnight and reassess in the AM.

## 2019-02-17 NOTE — Progress Notes (Signed)
Physical Therapy Treatment Patient Details Name: [name] MRN: [id number] DOB: 10-13-1950 Today's Date: 02/17/2019    History of Present Illness 68 yo female s/p R TKR on 02/16/19. PMH includes HTN, HLD, anxiety, OA, L knee arthroscopy.     PT Comments    Pt found in chair, reports more comfortable to sleep last night  Pt friendly and willing to participate with therapy.  Pain scale at 4/10, premedicated for pain control.  Reviewed importance of exercises for maximal benefits with mobility and strengthening.  HEP printout given, pt able to demonstrate and verbalize appropriate mechanics with all exercises without difficulty.   Gait training complete with RW, min cueing initially for equal stride length and heel strike to improve mechanics with min guard, no LOB episodes.  Stair training complete, pt able to demonstrate safe mechanics with step to pattern ascending and descending with one hand rail to simulate return home.  EOS pt left in chair with recliner extended and ice applied for pain.  Pt reports increased pain following session, RN aware.   Follow Up Recommendations  Follow surgeon's recommendation for DC plan and follow-up therapies;Supervision for mobility/OOB(OPPT)     Equipment Recommendations  None recommended by PT    Recommendations for Other Services       Precautions / Restrictions Precautions Precautions: Fall Required Braces or Orthoses: Knee Immobilizer - Right Knee Immobilizer - Right: On when out of bed or walking;Discontinue once straight leg raise with < 10 degree lag Restrictions Weight Bearing Restrictions: No Other Position/Activity Restrictions: WBAT    Mobility  Bed Mobility               General bed mobility comments: Sitting in chair upon therapist arrival  Transfers Overall transfer level: Needs assistance Equipment used: Rolling walker (2 wheeled) Transfers: Sit to/from Stand Sit to Stand: Min assist         General transfer  comment: Min assist for power up, pt with self-steadying upon standing. Verbal cuing for hand placement when rising.   Ambulation/Gait Ambulation/Gait assistance: Min guard Gait Distance (Feet): 150 Feet Assistive device: Rolling walker (2 wheeled) Gait Pattern/deviations: Step-to pattern;Step-through pattern;Decreased stride length;Trunk flexed Gait velocity: decr    General Gait Details: Min guard for safety.  Min cueing for equal stride length and to push RW further, safe mechanics wiht step-through gait   Stairs Stairs: Yes Stairs assistance: Min assist Stair Management: One rail Right Number of Stairs: 3(6in step height, 2 sets) General stair comments: step to pattern, able to verbalize and demonstrate safe mechanics   Wheelchair Mobility    Modified Rankin (Stroke Patients Only)       Balance                                            Cognition Arousal/Alertness: Awake/alert Behavior During Therapy: WFL for tasks assessed/performed Overall Cognitive Status: Within Functional Limits for tasks assessed                                        Exercises Total Joint Exercises Ankle Circles/Pumps: Both;20 reps;Seated Quad Sets: 10 reps;Right;Supine(with recliner extended and towel under knee) Short Arc Quad: AAROM;Right;5 reps;Seated(2 sets with recliner extensed and towel rolled under knee) Heel Slides: Right;10 reps;Seated Hip ABduction/ADduction: 10 reps;Both;Strengthening;Supine(recliner extended) Long  Arc Quad: Right;5 reps;Strengthening;AROM Goniometric ROM: AROM 12-67*  Rt knee    General Comments        Pertinent Vitals/Pain Pain Assessment: 0-10 Pain Score: 4  Pain Location: R knee  Pain Descriptors / Indicators: Aching;Sore Pain Intervention(s): Premedicated before session;Monitored during session;Limited activity within patient's tolerance;Ice applied    Home Living                      Prior Function             PT Goals (current goals can now be found in the care plan section)      Frequency    7X/week      PT Plan Current plan remains appropriate    Co-evaluation              AM-PAC PT "6 Clicks" Mobility   Outcome Measure  Help needed turning from your back to your side while in a flat bed without using bedrails?: A Little Help needed moving from lying on your back to sitting on the side of a flat bed without using bedrails?: A Little Help needed moving to and from a bed to a chair (including a wheelchair)?: A Little Help needed standing up from a chair using your arms (e.g., wheelchair or bedside chair)?: A Little Help needed to walk in hospital room?: A Little Help needed climbing 3-5 steps with a railing? : A Little 6 Click Score: 18    End of Session Equipment Utilized During Treatment: Gait belt;Right knee immobilizer Activity Tolerance: Patient tolerated treatment well Patient left: in chair;with call bell/phone within reach(with recliner extended and ice applied to knee) Nurse Communication: Mobility status PT Visit Diagnosis: Other abnormalities of gait and mobility (R26.89);Difficulty in walking, not elsewhere classified (R26.2)     Time: 2951-8841 PT Time Calculation (min) (ACUTE ONLY): 55 min  Charges:  $Gait Training: 8-22 mins $Therapeutic Exercise: 23-37 mins                     [address] LPTA; [place]  Aldona Lento 02/17/2019, 2:35 PM

## 2019-02-18 LAB — CBC
HCT: 36.5 % (ref 36.0–46.0)
Hemoglobin: 11.6 g/dL — ABNORMAL LOW (ref 12.0–15.0)
MCH: 28.9 pg (ref 26.0–34.0)
MCHC: 31.8 g/dL (ref 30.0–36.0)
MCV: 91 fL (ref 80.0–100.0)
Platelets: 217 10*3/uL (ref 150–400)
RBC: 4.01 MIL/uL (ref 3.87–5.11)
RDW: 13.6 % (ref 11.5–15.5)
WBC: 11.2 10*3/uL — ABNORMAL HIGH (ref 4.0–10.5)
nRBC: 0 % (ref 0.0–0.2)

## 2019-02-18 LAB — BASIC METABOLIC PANEL
Anion gap: 8 (ref 5–15)
BUN: 15 mg/dL (ref 8–23)
CO2: 26 mmol/L (ref 22–32)
Calcium: 8.6 mg/dL — ABNORMAL LOW (ref 8.9–10.3)
Chloride: 106 mmol/L (ref 98–111)
Creatinine, Ser: 0.63 mg/dL (ref 0.44–1.00)
GFR calc Af Amer: >60 mL/min (ref >60)
GFR calc non Af Amer: >60 mL/min (ref >60)
Glucose, Bld: 119 mg/dL — ABNORMAL HIGH (ref 70–99)
Potassium: 3.1 mmol/L — ABNORMAL LOW (ref 3.5–5.1)
Sodium: 140 mmol/L (ref 135–145)

## 2019-02-18 MED ORDER — POTASSIUM CHLORIDE CRYS ER 20 MEQ PO TBCR
40.0000 meq | EXTENDED_RELEASE_TABLET | ORAL | Status: DC
  Administered 2019-02-18: 40 meq via ORAL
  Filled 2019-02-18: qty 2

## 2019-02-18 NOTE — Progress Notes (Signed)
Physical Therapy Treatment Patient Details Name: [name] MRN: [id number] DOB: 06-17-1951 Today's Date: 02/18/2019    History of Present Illness 68 yo female s/p R TKR on 02/16/19. PMH includes HTN, HLD, anxiety, OA, L knee arthroscopy.     PT Comments    Pt sitting in chair upon therapist entrance and reports pain has reduced significantly over night, pre-medicated this morning with no pain at entrance.  Pt able to recall and demonstrate appropriate mechanics with therex, did require more assistance with SAQ/SLR due to soreness.  Safe gait mechanics with use of RW, good reciprocal pattern and equal stride length.  Pt able to verbalize appropriate mechanics with step to pattern on stairs safely, just slow and controlled.  EOS pt left in chair with ice applied to knee elevated.  Reports of minimal increased pain by 1 grade at EOS.     Follow Up Recommendations        Equipment Recommendations       Recommendations for Other Services       Precautions / Restrictions Precautions Precautions: Fall Required Braces or Orthoses: Knee Immobilizer - Right Knee Immobilizer - Right: On when out of bed or walking;Discontinue once straight leg raise with < 10 degree lag Restrictions Weight Bearing Restrictions: No Other Position/Activity Restrictions: WBAT    Mobility  Bed Mobility               General bed mobility comments: Pt sitting in chair upon therapist entrance  Transfers Overall transfer level: Modified independent Equipment used: Rolling walker (2 wheeled) Transfers: Sit to/from Stand Sit to Stand: Min guard         General transfer comment: Min guard for safety.  Pt able to demonstrate appropriate mechanics for safe sit to stand  Ambulation/Gait Ambulation/Gait assistance: Min guard Gait Distance (Feet): 200 Feet Assistive device: Rolling walker (2 wheeled) Gait Pattern/deviations: Step-to pattern;Step-through pattern;Decreased stride length;Trunk flexed Gait  velocity: decr    General Gait Details: Min guard for safety,  Good mechanics with step through gait, no LOB episodes   Stairs Stairs: Yes Stairs assistance: Min assist Stair Management: One rail Right Number of Stairs: 3 General stair comments: step to pattern, pt able to verbalize and demonstrate safe mechanics   Wheelchair Mobility    Modified Rankin (Stroke Patients Only)       Balance                                            Cognition Arousal/Alertness: Awake/alert Behavior During Therapy: WFL for tasks assessed/performed Overall Cognitive Status: Within Functional Limits for tasks assessed                                        Exercises Total Joint Exercises Ankle Circles/Pumps: Both;20 reps;Seated Quad Sets: 10 reps;Right;Supine(Long sitting in recliner) Short Arc Quad: AAROM;Right;5 reps;Supine(Long sitting in recliner) Heel Slides: Right;10 reps;Seated(Long sitting in recliner) Hip ABduction/ADduction: 10 reps;Both;Strengthening;Supine(Long sitting in recliner) Straight Leg Raises: Right;10 reps;Seated;Strengthening(Long sitting in recliner) Long Arc Quad: Right;Strengthening;AROM;10 reps Goniometric ROM: AROM: 10-84* Rt knee    General Comments        Pertinent Vitals/Pain Pain Assessment: 0-10 Pain Score: 2  Pain Location: R knee  Pain Descriptors / Indicators: Aching;Sore Pain Intervention(s): Premedicated before session;Monitored during session;Limited activity  within patient's tolerance;Ice applied    Home Living                      Prior Function            PT Goals (current goals can now be found in the care plan section)      Frequency           PT Plan      Co-evaluation              AM-PAC PT "6 Clicks" Mobility   Outcome Measure                   End of Session Equipment Utilized During Treatment: Gait belt;Right knee immobilizer Activity Tolerance: Patient  tolerated treatment well Patient left: in chair;with call bell/phone within reach(ice on knee)         Time: 0940-1010 PT Time Calculation (min) (ACUTE ONLY): 30 min  Charges:  $Gait Training: 8-22 mins $Therapeutic Exercise: 8-22 mins                     [address] [place]; [place]  Aldona Lento 02/18/2019, 12:05 PM

## 2019-02-18 NOTE — Progress Notes (Signed)
   Subjective: 2 Days Post-Op Procedure(s) (LRB): TOTAL KNEE ARTHROPLASTY (Right) Patient reports pain as mild.   Patient seen in rounds for Dr. Wynelle Link. Patient is well, and has had no acute complaints or problems other than pain in the right knee. No acute events overnight. Voiding without difficulty, positive flatus. Potassium of 3.1 this morning.  Plan is to go Home after hospital stay.  Objective: Vital signs in last 24 hours: Temp:  [98 F (36.7 C)-98.6 F (37 C)] 98.4 F (36.9 C) (06/03 0658) Pulse Rate:  [53-64] 64 (06/03 0705) Resp:  [14-20] 18 (06/03 0705) BP: (124-180)/(63-96) 180/96 (06/03 0705) SpO2:  [95 %-100 %] 100 % (06/03 0705)  Intake/Output from previous day:  Intake/Output Summary (Last 24 hours) at 02/18/2019 0809 Last data filed at 02/18/2019 0200 Gross per 24 hour  Intake 1167.38 ml  Output 300 ml  Net 867.38 ml    Intake/Output this shift: No intake/output data recorded.  Labs: Recent Labs    02/17/19 0413 02/18/19 0334  HGB 12.0 11.6*   Recent Labs    02/17/19 0413 02/18/19 0334  WBC 11.9* 11.2*  RBC 4.19 4.01  HCT 38.2 36.5  PLT 234 217   Recent Labs    02/17/19 0413 02/18/19 0334  NA 138 140  K 3.7 3.1*  CL 105 106  CO2 26 26  BUN 12 15  CREATININE 0.56 0.63  GLUCOSE 131* 119*  CALCIUM 8.4* 8.6*   No results for input(s): LABPT, INR in the last 72 hours.  Exam: General - Patient is Alert and Oriented Extremity - Neurologically intact Sensation intact distally Intact pulses distally Dorsiflexion/Plantar flexion intact Dressing/Incision - clean, no drainage Motor Function - intact, moving foot and toes well on exam.   Past Medical History:  Diagnosis Date  . Anxiety   . Arthritis    knees   . GERD (gastroesophageal reflux disease)   . Heart murmur    hx of in past  . Hyperlipidemia   . Hypertension   . Microscopic hematuria   . Obesity     Assessment/Plan: 2 Days Post-Op Procedure(s) (LRB): TOTAL KNEE  ARTHROPLASTY (Right) Principal Problem:   OA (osteoarthritis) of knee Active Problems:   Osteoarthritis of right knee  Estimated body mass index is 31.31 kg/m as calculated from the following:   Height as of this encounter: 5\' 9"  (1.753 m).   Weight as of this encounter: 96.2 kg. Advance diet Up with therapy D/C IV fluids  DVT Prophylaxis - Aspirin Weight-bearing as tolerated  Plan for discharge home today as long as she is meeting her goals with therapy and pain is manageable. She will continue working with therapy today for 1-2 sessions. Her potassium was 3.1 this morning, so we will give 2 doses of potassium chloride 40 mEq today before she goes home. She will follow up in the office in 2 weeks.  Griffith Citron, PA-C Orthopedic Surgery 02/18/2019, 8:09 AM

## 2019-02-18 NOTE — Progress Notes (Signed)
Discharge instructions given to pt and all questions were answered. Pt taken down via wheelchair and was picked up by her husband.  

## 2019-02-19 NOTE — Discharge Summary (Signed)
Physician Discharge Summary   Patient ID: [name] MRN: [id number] DOB/AGE: 66/15/1952 66 y.o.  Admit date: 02/16/2019 Discharge date: 02/18/2019  Primary Diagnosis: Osteoarthritis, right knee  Admission Diagnoses:  Past Medical History:  Diagnosis Date  . Anxiety   . Arthritis    knees   . GERD (gastroesophageal reflux disease)   . Heart murmur    hx of in past  . Hyperlipidemia   . Hypertension   . Microscopic hematuria   . Obesity    Discharge Diagnoses:   Principal Problem:   OA (osteoarthritis) of knee Active Problems:   Osteoarthritis of right knee  Estimated body mass index is 31.31 kg/m as calculated from the following:   Height as of this encounter: 5\' 9"  (1.753 m).   Weight as of this encounter: 96.2 kg.  Procedure:  Procedure(s) (LRB): TOTAL KNEE ARTHROPLASTY (Right)   Consults: None  HPI:  [name] is a 68 y.o. year old female with end stage OA of her right knee with progressively worsening pain and dysfunction. She has constant pain, with activity and at rest and significant functional deficits with difficulties even with ADLs. She has had extensive non-op management including analgesics, injections of cortisone and viscosupplements, and home exercise program, but remains in significant pain with significant dysfunction.Radiographs show bone on bone arthritis medial and patellofemoral. She presents now for right Total Knee Arthroplasty.    Laboratory Data: Admission on 02/16/2019, Discharged on 02/18/2019  Component Date Value Ref Range Status  . WBC 02/17/2019 11.9* 4.0 - 10.5 K/uL Final  . RBC 02/17/2019 4.19  3.87 - 5.11 MIL/uL Final  . Hemoglobin 02/17/2019 12.0  12.0 - 15.0 g/dL Final  . HCT 02/17/2019 38.2  36.0 - 46.0 % Final  . MCV 02/17/2019 91.2  80.0 - 100.0 fL Final  . MCH 02/17/2019 28.6  26.0 - 34.0 pg Final  . MCHC 02/17/2019 31.4  30.0 - 36.0 g/dL Final  . RDW 02/17/2019 13.4  11.5 - 15.5 % Final  . Platelets 02/17/2019 234   150 - 400 K/uL Final  . nRBC 02/17/2019 0.0  0.0 - 0.2 % Final   Performed at [facility], [place] [address], [place], [place] [address]  . Sodium 02/17/2019 138  135 - 145 mmol/L Final  . Potassium 02/17/2019 3.7  3.5 - 5.1 mmol/L Final  . Chloride 02/17/2019 105  98 - 111 mmol/L Final  . CO2 02/17/2019 26  22 - 32 mmol/L Final  . Glucose, Bld 02/17/2019 131* 70 - 99 mg/dL Final  . BUN 02/17/2019 12  8 - 23 mg/dL Final  . Creatinine, Ser 02/17/2019 0.56  0.44 - 1.00 mg/dL Final  . Calcium 02/17/2019 8.4* 8.9 - 10.3 mg/dL Final  . GFR calc non Af Amer 02/17/2019 >60  >60 mL/min Final  . GFR calc Af Amer 02/17/2019 >60  >60 mL/min Final  . Anion gap 02/17/2019 7  5 - 15 Final   Performed at [facility], [place] [address] [place], [place] [address]  . WBC 02/18/2019 11.2* 4.0 - 10.5 K/uL Final  . RBC 02/18/2019 4.01  3.87 - 5.11 MIL/uL Final  . Hemoglobin 02/18/2019 11.6* 12.0 - 15.0 g/dL Final  . HCT 02/18/2019 36.5  36.0 - 46.0 % Final  . MCV 02/18/2019 91.0  80.0 - 100.0 fL Final  . MCH 02/18/2019 28.9  26.0 - 34.0 pg Final  . MCHC 02/18/2019 31.8  30.0 - 36.0 g/dL Final  . RDW  02/18/2019 13.6  11.5 - 15.5 % Final  . Platelets 02/18/2019 217  150 - 400 K/uL Final  . nRBC 02/18/2019 0.0  0.0 - 0.2 % Final   Performed at [facility], [place] [address] [place], [place] [address]  . Sodium 02/18/2019 140  135 - 145 mmol/L Final  . Potassium 02/18/2019 3.1* 3.5 - 5.1 mmol/L Final  . Chloride 02/18/2019 106  98 - 111 mmol/L Final  . CO2 02/18/2019 26  22 - 32 mmol/L Final  . Glucose, Bld 02/18/2019 119* 70 - 99 mg/dL Final  . BUN 02/18/2019 15  8 - 23 mg/dL Final  . Creatinine, Ser 02/18/2019 0.63  0.44 - 1.00 mg/dL Final  . Calcium 02/18/2019 8.6* 8.9 - 10.3 mg/dL Final  . GFR calc non Af Amer 02/18/2019 >60  >60 mL/min Final  . GFR calc Af Amer 02/18/2019 >60  >60 mL/min Final  . Anion gap 02/18/2019 8  5 - 15 Final   Performed at  [facility], [place] [address], [place], [place] [address]  Hospital Outpatient Visit on 02/12/2019  Component Date Value Ref Range Status  . SARS-CoV-2, NAA 02/12/2019 NOT DETECTED  NOT DETECTED Final   Comment: (NOTE) This test was developed and its performance characteristics determined by [employer]. This test has not been FDA cleared or approved. This test has been authorized by FDA under an Emergency Use Authorization (EUA). This test is only authorized for the duration of time the declaration that circumstances exist justifying the authorization of the emergency use of in vitro diagnostic tests for detection of SARS-CoV-2 virus and/or diagnosis of COVID-19 infection under section 564(b)(1) of the Act, 21 U.S.C. 518ACZ-6(S)(0), unless the authorization is terminated or revoked sooner. When diagnostic testing is negative, the possibility of a false negative result should be considered in the context of a patient's recent exposures and the presence of clinical signs and symptoms consistent with COVID-19. An individual without symptoms of COVID-19 and who is not shedding SARS-CoV-2 virus would expect to have a negative (not detected) result in this assay. Performed                           At: [facility] [place], [place] [id number] Rush Farmer MD NA:[id number]   . Coronavirus Source 02/12/2019 NASOPHARYNGEAL   Final   Performed at [facility], [place] [address], [place], [place] [address]  Hospital Outpatient Visit on 02/10/2019  Component Date Value Ref Range Status  . aPTT 02/10/2019 27  24 - 36 seconds Final   Performed at [facility], [place] [address] [place], [place] [address]  . WBC 02/10/2019 5.9  4.0 - 10.5 K/uL Final  . RBC 02/10/2019 5.08  3.87 - 5.11 MIL/uL Final  . Hemoglobin 02/10/2019 14.4  12.0 - 15.0 g/dL Final  . HCT 02/10/2019 45.5  36.0 - 46.0 % Final  . MCV 02/10/2019 89.6  80.0 -  100.0 fL Final  . MCH 02/10/2019 28.3  26.0 - 34.0 pg Final  . MCHC 02/10/2019 31.6  30.0 - 36.0 g/dL Final  . RDW 02/10/2019 13.6  11.5 - 15.5 % Final  . Platelets 02/10/2019 240  150 - 400 K/uL Final  . nRBC 02/10/2019 0.0  0.0 - 0.2 % Final   Performed at [facility], [place] [address], [place], [place] [address]  . Sodium 02/10/2019 142  135 - 145 mmol/L  Final  . Potassium 02/10/2019 3.4* 3.5 - 5.1 mmol/L Final  . Chloride 02/10/2019 105  98 - 111 mmol/L Final  . CO2 02/10/2019 29  22 - 32 mmol/L Final  . Glucose, Bld 02/10/2019 104* 70 - 99 mg/dL Final  . BUN 02/10/2019 13  8 - 23 mg/dL Final  . Creatinine, Ser 02/10/2019 0.74  0.44 - 1.00 mg/dL Final  . Calcium 02/10/2019 9.5  8.9 - 10.3 mg/dL Final  . Total Protein 02/10/2019 7.2  6.5 - 8.1 g/dL Final  . Albumin 02/10/2019 4.1  3.5 - 5.0 g/dL Final  . AST 02/10/2019 19  15 - 41 U/L Final  . ALT 02/10/2019 19  0 - 44 U/L Final  . Alkaline Phosphatase 02/10/2019 50  38 - 126 U/L Final  . Total Bilirubin 02/10/2019 0.7  0.3 - 1.2 mg/dL Final  . GFR calc non Af Amer 02/10/2019 >60  >60 mL/min Final  . GFR calc Af Amer 02/10/2019 >60  >60 mL/min Final  . Anion gap 02/10/2019 8  5 - 15 Final   Performed at [facility], [place] [address] [place], [place] [address]  . Prothrombin Time 02/10/2019 13.0  11.4 - 15.2 seconds Final  . INR 02/10/2019 1.0  0.8 - 1.2 Final   Comment: (NOTE) INR goal varies based on device and disease states. Performed at [facility], [place] [address], [place], [place] [address]   . ABO/RH(D) 02/10/2019 A POS   Final  . Antibody Screen 02/10/2019 NEG   Final  . Sample Expiration 02/10/2019 02/19/2019,2359   Final  . Extend sample reason 02/10/2019    Final                   Value:NO TRANSFUSIONS OR PREGNANCY IN THE PAST 3 MONTHS Performed at [place] [address] [place], [place] [address]   . MRSA, PCR 02/10/2019 NEGATIVE   NEGATIVE Final  . Staphylococcus aureus 02/10/2019 NEGATIVE  NEGATIVE Final   Comment: (NOTE) The Xpert SA Assay (FDA approved for NASAL specimens in patients 30 years of age and older), is one component of a comprehensive surveillance program. It is not intended to diagnose infection nor to guide or monitor treatment. Performed at [facility], [place] [address] [place], [place] [address]   . ABO/RH(D) 02/10/2019    Final                   Value:A POS Performed at [facility], [place] [address] [place], [place] [address]      X-Rays:No results found.  EKG: Orders placed or performed during the hospital encounter of 02/10/19  . EKG 12 lead  . EKG 12 lead     Hospital Course: [name] is a 68 y.o. who was admitted to [facility]. They were brought to the operating room on 02/16/2019 and underwent Procedure(s): TOTAL KNEE ARTHROPLASTY.  Patient tolerated the procedure well and was later transferred to the recovery room and then to the orthopaedic floor for postoperative care. They were given PO and IV analgesics for pain control following their surgery. They were given 24 hours of postoperative antibiotics of  Anti-infectives (From admission, onward)   Start     Dose/Rate Route Frequency Ordered Stop   02/16/19 1730  ceFAZolin (ANCEF) IVPB 2g/100 mL premix     2 g 200 mL/hr over 30 Minutes Intravenous Every 6 hours 02/16/19 1443 02/17/19 0200  02/16/19 0915  ceFAZolin (ANCEF) IVPB 2g/100 mL premix     2 g 200 mL/hr over 30 Minutes Intravenous On call to O.R. 02/16/19 0902 02/16/19 1147     and started on DVT prophylaxis in the form of Aspirin.   PT and OT were ordered for total joint protocol. Discharge planning consulted to help with postop disposition and equipment needs. Patient had a good night on the evening of surgery. They started to get up OOB with therapy on POD #0. Hemovac drain was pulled without difficulty on day one. She  was having difficulty managing pain with oral medications, so she was kept overnight to reassess the next morning. Continued to work with therapy into POD #2. Pt was seen during rounds on day two and was ready to go home pending progress with therapy. Dressing was changed and the incision was clean and dry. Pt worked with therapy for one additional session and was meeting their goals. She was discharged to home later that day in stable condition.  Diet: Regular diet Activity: WBAT Follow-up: in 2 weeks Disposition: Home Discharged Condition: good   Discharge Instructions    Call MD / Call 911   Complete by:  As directed    If you experience chest pain or shortness of breath, CALL 911 and be transported to the hospital emergency room.  If you develope a fever above 101 F, pus (white drainage) or increased drainage or redness at the wound, or calf pain, call your surgeon's office.   Change dressing   Complete by:  As directed    Change dressing on Wednesday, then change the dressing daily with sterile 4 x 4 inch gauze dressing and apply TED hose.   Constipation Prevention   Complete by:  As directed    Drink plenty of fluids.  Prune juice may be helpful.  You may use a stool softener, such as Colace (over the counter) 100 mg twice a day.  Use MiraLax (over the counter) for constipation as needed.   Diet - low sodium heart healthy   Complete by:  As directed    Discharge instructions   Complete by:  As directed    Dr. Gaynelle Arabian Total Joint Specialist [facility] [address] [place], [place] [address] [phone]  TOTAL KNEE REPLACEMENT POSTOPERATIVE DIRECTIONS  Knee Rehabilitation, Guidelines Following Surgery  Results after knee surgery are often greatly improved when you follow the exercise, range of motion and muscle strengthening exercises prescribed by your doctor. Safety measures are also important to protect the knee from further injury. Any time any of these  exercises cause you to have increased pain or swelling in your knee joint, decrease the amount until you are comfortable again and slowly increase them. If you have problems or questions, call your caregiver or physical therapist for advice.   HOME CARE INSTRUCTIONS  Remove items at home which could result in a fall. This includes throw rugs or furniture in walking pathways.  ICE to the affected knee every three hours for 30 minutes at a time and then as needed for pain and swelling.  Continue to use ice on the knee for pain and swelling from surgery. You may notice swelling that will progress down to the foot and ankle.  This is normal after surgery.  Elevate the leg when you are not up walking on it.   Continue to use the breathing machine which will help keep your temperature down.  It is common for  your temperature to cycle up and down following surgery, especially at night when you are not up moving around and exerting yourself.  The breathing machine keeps your lungs expanded and your temperature down. Do not place pillow under knee, focus on keeping the knee straight while resting   DIET You may resume your previous home diet once your are discharged from the hospital.  DRESSING / WOUND CARE / SHOWERING You may change your dressing 3-5 days after surgery.  Then change the dressing every day with sterile gauze.  Please use good hand washing techniques before changing the dressing.  Do not use any lotions or creams on the incision until instructed by your surgeon. You may start showering once you are discharged home but do not submerge the incision under water. Just pat the incision dry and apply a dry gauze dressing on daily. Change the surgical dressing daily and reapply a dry dressing each time.  ACTIVITY Walk with your walker as instructed. Use walker as long as suggested by your caregivers. Avoid periods of inactivity such as sitting longer than an hour when not asleep. This helps  prevent blood clots.  You may resume a sexual relationship in one month or when given the OK by your doctor.  You may return to work once you are cleared by your doctor.  Do not drive a car for 6 weeks or until released by you surgeon.  Do not drive while taking narcotics.  WEIGHT BEARING Weight bearing as tolerated with assist device (walker, cane, etc) as directed, use it as long as suggested by your surgeon or therapist, typically at least 4-6 weeks.  POSTOPERATIVE CONSTIPATION PROTOCOL Constipation - defined medically as fewer than three stools per week and severe constipation as less than one stool per week.  One of the most common issues patients have following surgery is constipation.  Even if you have a regular bowel pattern at home, your normal regimen is likely to be disrupted due to multiple reasons following surgery.  Combination of anesthesia, postoperative narcotics, change in appetite and fluid intake all can affect your bowels.  In order to avoid complications following surgery, here are some recommendations in order to help you during your recovery period.  Colace (docusate) - Pick up an over-the-counter form of Colace or another stool softener and take twice a day as long as you are requiring postoperative pain medications.  Take with a full glass of water daily.  If you experience loose stools or diarrhea, hold the colace until you stool forms back up.  If your symptoms do not get better within 1 week or if they get worse, check with your doctor.  Dulcolax (bisacodyl) - Pick up over-the-counter and take as directed by the product packaging as needed to assist with the movement of your bowels.  Take with a full glass of water.  Use this product as needed if not relieved by Colace only.   MiraLax (polyethylene glycol) - Pick up over-the-counter to have on hand.  MiraLax is a solution that will increase the amount of water in your bowels to assist with bowel movements.  Take as  directed and can mix with a glass of water, juice, soda, coffee, or tea.  Take if you go more than two days without a movement. Do not use MiraLax more than once per day. Call your doctor if you are still constipated or irregular after using this medication for 7 days in a row.  If you continue to have  problems with postoperative constipation, please contact the office for further assistance and recommendations.  If you experience "the worst abdominal pain ever" or develop nausea or vomiting, please contact the office immediatly for further recommendations for treatment.  ITCHING  If you experience itching with your medications, try taking only a single pain pill, or even half a pain pill at a time.  You can also use Benadryl over the counter for itching or also to help with sleep.   TED HOSE STOCKINGS Wear the elastic stockings on both legs for three weeks following surgery during the day but you may remove then at night for sleeping.  MEDICATIONS See your medication summary on the "After Visit Summary" that the nursing staff will review with you prior to discharge.  You may have some home medications which will be placed on hold until you complete the course of blood thinner medication.  It is important for you to complete the blood thinner medication as prescribed by your surgeon.  Continue your approved medications as instructed at time of discharge.  PRECAUTIONS If you experience chest pain or shortness of breath - call 911 immediately for transfer to the hospital emergency department.  If you develop a fever greater that 101 F, purulent drainage from wound, increased redness or drainage from wound, foul odor from the wound/dressing, or calf pain - CONTACT YOUR SURGEON.                                                   FOLLOW-UP APPOINTMENTS Make sure you keep all of your appointments after your operation with your surgeon and caregivers. You should call the office at the above phone number  and make an appointment for approximately two weeks after the date of your surgery or on the date instructed by your surgeon outlined in the "After Visit Summary".   RANGE OF MOTION AND STRENGTHENING EXERCISES  Rehabilitation of the knee is important following a knee injury or an operation. After just a few days of immobilization, the muscles of the thigh which control the knee become weakened and shrink (atrophy). Knee exercises are designed to build up the tone and strength of the thigh muscles and to improve knee motion. Often times heat used for twenty to thirty minutes before working out will loosen up your tissues and help with improving the range of motion but do not use heat for the first two weeks following surgery. These exercises can be done on a training (exercise) mat, on the floor, on a table or on a bed. Use what ever works the best and is most comfortable for you Knee exercises include:  Leg Lifts - While your knee is still immobilized in a splint or cast, you can do straight leg raises. Lift the leg to 60 degrees, hold for 3 sec, and slowly lower the leg. Repeat 10-20 times 2-3 times daily. Perform this exercise against resistance later as your knee gets better.  Quad and Hamstring Sets - Tighten up the muscle on the front of the thigh (Quad) and hold for 5-10 sec. Repeat this 10-20 times hourly. Hamstring sets are done by pushing the foot backward against an object and holding for 5-10 sec. Repeat as with quad sets.  Leg Slides: Lying on your back, slowly slide your foot toward your buttocks, bending your knee up off the floor (  only go as far as is comfortable). Then slowly slide your foot back down until your leg is flat on the floor again. Angel Wings: Lying on your back spread your legs to the side as far apart as you can without causing discomfort.  A rehabilitation program following serious knee injuries can speed recovery and prevent re-injury in the future due to weakened muscles.  Contact your doctor or a physical therapist for more information on knee rehabilitation.   IF YOU ARE TRANSFERRED TO A SKILLED REHAB FACILITY If the patient is transferred to a skilled rehab facility following release from the hospital, a list of the current medications will be sent to the facility for the patient to continue.  When discharged from the skilled rehab facility, please have the facility set up the patient's [place] prior to being released. Also, the skilled facility will be responsible for providing the patient with their medications at time of release from the facility to include their pain medication, the muscle relaxants, and their blood thinner medication. If the patient is still at the rehab facility at time of the two week follow up appointment, the skilled rehab facility will also need to assist the patient in arranging follow up appointment in our office and any transportation needs.  MAKE SURE YOU:  Understand these instructions.  Get help right away if you are not doing well or get worse.    Pick up stool softner and laxative for home use following surgery while on pain medications. Do not submerge incision under water. Please use good hand washing techniques while changing dressing each day. May shower starting three days after surgery. Please use a clean towel to pat the incision dry following showers. Continue to use ice for pain and swelling after surgery. Do not use any lotions or creams on the incision until instructed by your surgeon.   Do not put a pillow under the knee. Place it under the heel.   Complete by:  As directed    Driving restrictions   Complete by:  As directed    No driving for two weeks   TED hose   Complete by:  As directed    Use stockings (TED hose) for three weeks on both leg(s).  You may remove them at night for sleeping.   Weight bearing as tolerated   Complete by:  As directed      Allergies as of 02/18/2019   No  Known Allergies     Medication List    STOP taking these medications   aspirin 81 MG tablet Replaced by:  aspirin 325 MG EC tablet   ibuprofen 200 MG tablet Commonly known as:  ADVIL   meloxicam 15 MG tablet Commonly known as:  MOBIC     TAKE these medications   aspirin 325 MG EC tablet Take 1 tablet (325 mg total) by mouth 2 (two) times daily for 20 days. Then resume one 81 mg aspirin once a day. Replaces:  aspirin 81 MG tablet   calcium carbonate 600 MG Tabs tablet Commonly known as:  OS-CAL Take 1 tablet (600 mg total) by mouth daily.   cholecalciferol 1000 units tablet Commonly known as:  VITAMIN D Take 1 tablet (1,000 Units total) by mouth daily.   fish oil-omega-3 fatty acids 1000 MG capsule Take 2 g by mouth daily.   gabapentin 300 MG capsule Commonly known as:  NEURONTIN Take 1 capsule (300 mg total) by mouth 3 (three) times daily. Take  a 300 mg capsule three times a day for two weeks following surgery.Then take a 300 mg capsule two times a day for two weeks. Then take a 300 mg capsule once a day for two weeks. Then discontinue.   hydrochlorothiazide 25 MG tablet Commonly known as:  HYDRODIURIL TAKE 1 TABLET(25 MG) BY MOUTH DAILY   HYDROcodone-acetaminophen 5-325 MG tablet Commonly known as:  NORCO/VICODIN Take 1-2 tablets by mouth every 6 (six) hours as needed for severe pain.   methocarbamol 500 MG tablet Commonly known as:  ROBAXIN Take 1 tablet (500 mg total) by mouth every 6 (six) hours as needed for muscle spasms.   multivitamin tablet Take 1 tablet by mouth daily.   omeprazole 20 MG tablet Commonly known as:  PRILOSEC OTC Take 20 mg by mouth at bedtime.   simvastatin 10 MG tablet Commonly known as:  ZOCOR TAKE 1 TABLET BY MOUTH EVERY DAY AT 6 PM   traMADol 50 MG tablet Commonly known as:  ULTRAM Take 1-2 tablets (50-100 mg total) by mouth every 6 (six) hours as needed for moderate pain.   vitamin C 1000 MG tablet Take 1,000 mg by mouth  daily.            Discharge Care Instructions  (From admission, onward)         Start     Ordered   02/17/19 0000  Weight bearing as tolerated     02/17/19 0727   02/17/19 0000  Change dressing    Comments:  Change dressing on Wednesday, then change the dressing daily with sterile 4 x 4 inch gauze dressing and apply TED hose.   02/17/19 5329         Follow-up Information    Gaynelle Arabian, MD. Schedule an appointment as soon as possible for a visit on 03/03/2019.   Specialty:  Orthopedic Surgery Contact information: [address] [place] [address] [phone]           Signed: Griffith Citron, PA-C Orthopedic Surgery 02/19/2019, 9:49 AM

## 2019-02-20 DIAGNOSIS — M25561 Pain in right knee: Secondary | ICD-10-CM | POA: Diagnosis not present

## 2019-02-23 DIAGNOSIS — M25561 Pain in right knee: Secondary | ICD-10-CM | POA: Diagnosis not present

## 2019-02-25 DIAGNOSIS — M25561 Pain in right knee: Secondary | ICD-10-CM | POA: Diagnosis not present

## 2019-02-27 DIAGNOSIS — M25561 Pain in right knee: Secondary | ICD-10-CM | POA: Diagnosis not present

## 2019-03-02 DIAGNOSIS — M25561 Pain in right knee: Secondary | ICD-10-CM | POA: Diagnosis not present

## 2019-03-04 DIAGNOSIS — M25561 Pain in right knee: Secondary | ICD-10-CM | POA: Diagnosis not present

## 2019-03-06 DIAGNOSIS — M25561 Pain in right knee: Secondary | ICD-10-CM | POA: Diagnosis not present

## 2019-03-11 DIAGNOSIS — M25561 Pain in right knee: Secondary | ICD-10-CM | POA: Diagnosis not present

## 2019-03-13 DIAGNOSIS — M25561 Pain in right knee: Secondary | ICD-10-CM | POA: Diagnosis not present

## 2019-03-16 DIAGNOSIS — M25561 Pain in right knee: Secondary | ICD-10-CM | POA: Diagnosis not present

## 2019-03-18 DIAGNOSIS — M25561 Pain in right knee: Secondary | ICD-10-CM | POA: Diagnosis not present

## 2019-03-23 DIAGNOSIS — M25561 Pain in right knee: Secondary | ICD-10-CM | POA: Diagnosis not present

## 2019-03-24 DIAGNOSIS — Z471 Aftercare following joint replacement surgery: Secondary | ICD-10-CM | POA: Diagnosis not present

## 2019-03-24 DIAGNOSIS — Z96651 Presence of right artificial knee joint: Secondary | ICD-10-CM | POA: Diagnosis not present

## 2019-04-10 DIAGNOSIS — Z1231 Encounter for screening mammogram for malignant neoplasm of breast: Secondary | ICD-10-CM | POA: Diagnosis not present

## 2019-04-17 ENCOUNTER — Telehealth: Payer: Self-pay

## 2019-04-17 NOTE — Telephone Encounter (Signed)
Form faxed to [facility].

## 2019-04-21 ENCOUNTER — Encounter: Payer: Self-pay | Admitting: Family Medicine

## 2019-04-21 DIAGNOSIS — R921 Mammographic calcification found on diagnostic imaging of breast: Secondary | ICD-10-CM | POA: Diagnosis not present

## 2019-04-21 DIAGNOSIS — R928 Other abnormal and inconclusive findings on diagnostic imaging of breast: Secondary | ICD-10-CM | POA: Diagnosis not present

## 2019-04-21 LAB — HM MAMMOGRAPHY

## 2019-04-27 ENCOUNTER — Other Ambulatory Visit: Payer: Self-pay | Admitting: Radiology

## 2019-04-27 ENCOUNTER — Encounter: Payer: Self-pay | Admitting: Family Medicine

## 2019-04-27 DIAGNOSIS — R921 Mammographic calcification found on diagnostic imaging of breast: Secondary | ICD-10-CM | POA: Diagnosis not present

## 2019-04-27 DIAGNOSIS — D0512 Intraductal carcinoma in situ of left breast: Secondary | ICD-10-CM | POA: Diagnosis not present

## 2019-04-29 ENCOUNTER — Telehealth: Payer: Self-pay | Admitting: Hematology

## 2019-04-29 NOTE — Telephone Encounter (Signed)
Spoke to patient to confirm afternoon [facility] appointment for 8/19, letter will be mailed to patient

## 2019-04-30 ENCOUNTER — Other Ambulatory Visit: Payer: Self-pay | Admitting: *Deleted

## 2019-04-30 ENCOUNTER — Encounter: Payer: Self-pay | Admitting: *Deleted

## 2019-04-30 DIAGNOSIS — D0512 Intraductal carcinoma in situ of left breast: Secondary | ICD-10-CM

## 2019-05-04 ENCOUNTER — Encounter: Payer: Self-pay | Admitting: Family Medicine

## 2019-05-05 ENCOUNTER — Other Ambulatory Visit: Payer: Self-pay | Admitting: Family Medicine

## 2019-05-05 NOTE — Progress Notes (Signed)
Radiation Oncology         [phone] ________________________________  Initial outpatient Consultation  Name: [name] MRN: [id number]  Date: 05/06/2019  DOB: Feb 01, 1951  [id number], Cammie Mcgee, MD  [name], MD   REFERRING PHYSICIAN: [name], MD  DIAGNOSIS:    ICD-10-CM   1. Ductal carcinoma in situ (DCIS) of left breast  D05.12    Stage 0 Left Breast UOQ DCIS, ER+ / PR+, Grade 3  CHIEF COMPLAINT: Here to discuss management of left breast DCIS  HISTORY OF PRESENT ILLNESS::[name] is a 68 y.o. female who presented with breast abnormality on the following imaging: screening mammogram on the date of 04/10/2019.  Symptoms, if any, at that time, were: none.   Ultrasound of left axilla on 04/27/2019 showed no evidence of malignancy.   Biopsy on date of 04/27/2019 showed ductal carcinoma in situ with calcifications.  ER status: 100%; PR status 80; Grade 3.  She is in her [place], She is retired. 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   PREVIOUS RADIATION THERAPY: No  PAST MEDICAL HISTORY:  has a past medical history of Anxiety, Arthritis, Ductal carcinoma in situ of breast, GERD (gastroesophageal reflux disease), Heart murmur, Hyperlipidemia, Hypertension, Microscopic hematuria, Obesity, and Thyroid disease.    PAST SURGICAL HISTORY: Past Surgical History:  Procedure Laterality Date  . calcium removed from left heel    . COLONOSCOPY    . KNEE ARTHROSCOPY Left 04/22/2013   Procedure: LEFT KNEE ARTHROSCOPY WITH medial meniscusectomy and chondroplasty;  Surgeon: Gearlean Alf, MD;  Location: [facility];  Service: Orthopedics;  Laterality: Left;  . TONSILLECTOMY    . TOTAL KNEE ARTHROPLASTY Right 02/16/2019   Procedure: TOTAL KNEE ARTHROPLASTY;  Surgeon: Gaynelle Arabian, MD;  Location: [facility];  Service: Orthopedics;  Laterality: Right;  65min  . TUBAL LIGATION      FAMILY HISTORY: family history includes Cancer (age of onset: 48) in her father; Colon polyps in  her father; Heart disease (age of onset: 40) in her mother; Hodgkin's lymphoma in her brother; Stomach cancer in her father.  SOCIAL HISTORY:  reports that she has never smoked. She has never used smokeless tobacco. She reports previous alcohol use. She reports that she does not use drugs.  ALLERGIES: Patient has no known allergies.  MEDICATIONS:  Current Outpatient Medications  Medication Sig Dispense Refill  . Ascorbic Acid (VITAMIN C) 1000 MG tablet Take 1,000 mg by mouth daily.    . calcium carbonate (OS-CAL) 600 MG TABS tablet Take 1 tablet (600 mg total) by mouth daily. 60 tablet 1  . cholecalciferol (VITAMIN D) 1000 units tablet Take 1 tablet (1,000 Units total) by mouth daily. 30 tablet 1  . hydrochlorothiazide (HYDRODIURIL) 25 MG tablet TAKE 1 TABLET(25 MG) BY MOUTH DAILY 90 tablet 3  . Multiple Vitamin (MULTIVITAMIN) tablet Take 1 tablet by mouth daily. 30 tablet 1  . omeprazole (PRILOSEC OTC) 20 MG tablet Take 20 mg by mouth at bedtime.     . simvastatin (ZOCOR) 10 MG tablet TAKE 1 TABLET BY MOUTH EVERY DAY AT 6PM 90 tablet 1   No current facility-administered medications for this encounter.     REVIEW OF SYSTEMS: As above   PHYSICAL EXAM:  Vitals:   05/06/19 1231  BP: (!) 130/98  Pulse: 80  Resp: 18  Temp: 99.1 F (37.3 C)  SpO2: 96%   Filed Weights   05/06/19 1231  Weight: 213 lb 3.2 oz (  96.7 kg)  General: Alert and oriented, in no acute distress Psychiatric: Judgment and insight are intact. Affect is appropriate. MSK: ambulatory, able to get on exam table on her own Breasts: Post biopsy swelling in left breast.  No other palpable masses appreciated in the breasts or axillae    ECOG = 0  0 - Asymptomatic (Fully active, able to carry on all predisease activities without restriction)  1 - Symptomatic but completely ambulatory (Restricted in physically strenuous activity but ambulatory and able to carry out work of a light or sedentary nature. For example, light  housework, office work)  2 - Symptomatic, <50% in bed during the day (Ambulatory and capable of all self care but unable to carry out any work activities. Up and about more than 50% of waking hours)  3 - Symptomatic, >50% in bed, but not bedbound (Capable of only limited self-care, confined to bed or chair 50% or more of waking hours)  4 - Bedbound (Completely disabled. Cannot carry on any self-care. Totally confined to bed or chair)  5 - Death   Eustace Pen MM, Creech RH, Tormey DC, et al. [phone]). "Toxicity and response criteria of the [facility]". [place] Oncol. 5 (6): 649-55   LABORATORY DATA:  Lab Results  Component Value Date   WBC 7.2 05/06/2019   HGB 14.0 05/06/2019   HCT 43.2 05/06/2019   MCV 85.5 05/06/2019   PLT 253 05/06/2019   CMP     Component Value Date/Time   NA 141 05/06/2019 1213   K 3.6 05/06/2019 1213   CL 106 05/06/2019 1213   CO2 25 05/06/2019 1213   GLUCOSE 97 05/06/2019 1213   BUN 16 05/06/2019 1213   CREATININE 0.78 05/06/2019 1213   CREATININE 0.68 11/25/2018 1439   CALCIUM 9.4 05/06/2019 1213   PROT 6.9 05/06/2019 1213   ALBUMIN 3.8 05/06/2019 1213   AST 15 05/06/2019 1213   ALT 15 05/06/2019 1213   ALKPHOS 60 05/06/2019 1213   BILITOT 0.4 05/06/2019 1213   GFRNONAA >60 05/06/2019 1213   GFRNONAA 91 11/25/2018 1439   GFRAA >60 05/06/2019 1213   GFRAA 106 11/25/2018 1439         RADIOGRAPHY: as above    IMPRESSION/PLAN: Left Breast DCIS - plan on lumpectomy  It was a pleasure meeting the patient today. We discussed the risks, benefits, and side effects of radiotherapy. I recommend radiotherapy to the left breast to reduce her risk of locoregional recurrence by half.  We discussed that radiation would take approximately 4 weeks to complete and that I would give the patient a few weeks to heal following surgery before starting treatment planning. We spoke about acute effects including skin irritation and fatigue as well  as much less common late effects including internal organ injury or irritation. We spoke about the latest technology that is used to minimize the risk of late effects for patients undergoing radiotherapy to the breast or chest wall. No guarantees of treatment were given. The patient is enthusiastic about proceeding with treatment. I look forward to participating in the patient's care.  I will await her referral back to me for postoperative follow-up and eventual CT simulation/treatment planning.  __________________________________________   [name], MD  This document serves as a record of services personally performed by [name], MD. It was created on her behalf by Wilburn Mylar, a trained medical scribe. The creation of this record is based on the scribe's personal observations and the provider's  statements to them. This document has been checked and approved by the attending provider.  

## 2019-05-05 NOTE — Progress Notes (Signed)
[facility]   [phone] Fax:([phone]) [place] Note   Patient Care Team: [name], MD as PCP - General (Family Medicine) [name] as Physician Assistant (Physician Assistant) [name], RN as Oncology Nurse Navigator [name], RN as Oncology Nurse Navigator [name], MD as Consulting Physician (General Surgery) [name], MD as Consulting Physician (Hematology) [name], MD as Attending Physician (Radiation Oncology)  Date of Service:  05/06/2019   CHIEF COMPLAINTS/PURPOSE OF CONSULTATION:  Newly Diagnosed Ductal carcinoma in situ (DCIS) of left breast   Oncology History  Ductal carcinoma in situ (DCIS) of left breast  04/27/2019 Initial Biopsy   Diagnosis 04/27/19 Breast, left, needle core biopsy, lower outer calcification, 10cmfn - DUCTAL CARCINOMA IN SITU WITH CALCIFICATIONS. - SEE COMMENT.  Results: IMMUNOHISTOCHEMICAL AND MORPHOMETRIC ANALYSIS PERFORMED MANUALLY Estrogen Receptor: 100%, POSITIVE, STRONG STAINING INTENSITY Progesterone Receptor: 80%, POSITIVE, STRONG STAINING INTENSITY   04/30/2019 Initial Diagnosis   Ductal carcinoma in situ (DCIS) of left breast   05/06/2019 Cancer Staging   Staging form: Breast, AJCC 8th Edition - Clinical: Stage 0 (cTis (DCIS), cN0, cM0, ER+, PR+, HER2: Not Assessed) - Signed by [name], MD on 05/06/2019      HISTORY OF PRESENTING ILLNESS:  [name] 68 y.o. female is a here because of newly diagnosed left breast DCIS. The patient presents to the Breast clinic today alone.  Her mass was found by screening mammogram. She had only 1 abnormal mammogram in the past but this is her first biopsy she had. She usually has mammogram every year. She denies palpable breast mass, nipple, skin, weight or appetite change. She denies breast pain except after her biopsy.  Today the patient notes 3 days before her biopsy she had mid upper back pain  which has improved and now present again. She took ibuprofen. She notes she is eating well and has adequate energy. She notes HA in clinic from elevated BP as she is nervous.   Socially married with 3 adult children. She is retired after working in a [place]. She drinks rarely. She smoked intermittently for 3 years as a teen.  They have a PMHx of HTN, on HCTZ and Zocor. She has arthritis with right knee issues. She had MSK surgery on LE and tubal ligation. Her father had cancer and is not sure what type. He also had stomach cancer. Her brother was exposed to agent orange and had non-hodgkin's and hodgkin's lymphoma.   GYN HISTORY  Menarchal: 67 yo LMP: 68 yo Contraceptive: No HRT: No  G3P3: first at age 26, no breastfeeding     REVIEW OF SYSTEMS:    Constitutional: Denies fevers, chills or abnormal night sweats (+) HA Eyes: Denies blurriness of vision, double vision or watery eyes Ears, nose, mouth, throat, and face: Denies mucositis or sore throat Respiratory: Denies cough, dyspnea or wheezes Cardiovascular: Denies palpitation, chest discomfort or lower extremity swelling Gastrointestinal:  Denies nausea, heartburn or change in bowel habits Skin: Denies abnormal skin rashes MSK: (+) Mid upper back pain, arthritis  Lymphatics: Denies new lymphadenopathy or easy bruising Neurological:Denies numbness, tingling or new weaknesses Behavioral/Psych: Mood is stable, no new changes  All other systems were reviewed with the patient and are negative.   MEDICAL HISTORY:  Past Medical History:  Diagnosis Date  . Anxiety   . Arthritis    knees   . Ductal carcinoma in situ of breast   . GERD (  gastroesophageal reflux disease)   . Heart murmur    hx of in past  . Hyperlipidemia   . Hypertension   . Microscopic hematuria   . Obesity   . Thyroid disease     SURGICAL HISTORY: Past Surgical History:  Procedure Laterality Date  . calcium removed from left heel    . COLONOSCOPY    . KNEE  ARTHROSCOPY Left 04/22/2013   Procedure: LEFT KNEE ARTHROSCOPY WITH medial meniscusectomy and chondroplasty;  Surgeon: Gearlean Alf, MD;  Location: [facility];  Service: Orthopedics;  Laterality: Left;  . TONSILLECTOMY    . TOTAL KNEE ARTHROPLASTY Right 02/16/2019   Procedure: TOTAL KNEE ARTHROPLASTY;  Surgeon: Gaynelle Arabian, MD;  Location: [facility];  Service: Orthopedics;  Laterality: Right;  43mn  . TUBAL LIGATION      SOCIAL HISTORY: Social History   Socioeconomic History  . Marital status: Married    Spouse name: Not on file  . Number of children: 3  . Years of education: Not on file  . Highest education level: Not on file  Occupational History  . Occupation: retired   SScientific laboratory technician . Financial resource strain: Not on file  . Food insecurity    Worry: Not on file    Inability: Not on file  . Transportation needs    Medical: Not on file    Non-medical: Not on file  Tobacco Use  . Smoking status: Never Smoker  . Smokeless tobacco: Never Used  . Tobacco comment: just smoked in high school  Substance and Sexual Activity  . Alcohol use: Not Currently  . Drug use: No  . Sexual activity: Yes    Birth control/protection: Surgical  Lifestyle  . Physical activity    Days per week: Not on file    Minutes per session: Not on file  . Stress: Not on file  Relationships  . Social [place] phone: Not on file    Gets together: Not on file    Attends religious service: Not on file    Active member of club or organization: Not on file    Attends meetings of clubs or organizations: Not on file    Relationship status: Not on file  . Intimate partner violence    Fear of current or ex partner: Not on file    Emotionally abused: Not on file    Physically abused: Not on file    Forced sexual activity: Not on file  Other Topics Concern  . 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       No other exercise.   Married.       Never smoked.    FAMILY HISTORY: Family History  Problem Relation Age of Onset  . Heart disease Mother 692      MI at [phone] [phone] . Cancer Father 726      Prostate Cancer  . Colon polyps Father   . Stomach cancer Father   . Hodgkin's lymphoma Brother   . Esophageal cancer Neg Hx   . Rectal cancer Neg Hx     ALLERGIES:  has No Known Allergies.  MEDICATIONS:  Current Outpatient Medications  Medication Sig Dispense Refill  . Ascorbic Acid (VITAMIN C) 1000 MG tablet Take 1,000 mg by mouth daily.    .Marland Kitchen  calcium carbonate (OS-CAL) 600 MG TABS tablet Take 1 tablet (600 mg total) by mouth daily. 60 tablet 1  . cholecalciferol (VITAMIN D) 1000 units tablet Take 1 tablet (1,000 Units total) by mouth daily. 30 tablet 1  . hydrochlorothiazide (HYDRODIURIL) 25 MG tablet TAKE 1 TABLET(25 MG) BY MOUTH DAILY 90 tablet 3  . Multiple Vitamin (MULTIVITAMIN) tablet Take 1 tablet by mouth daily. 30 tablet 1  . omeprazole (PRILOSEC OTC) 20 MG tablet Take 20 mg by mouth at bedtime.     . simvastatin (ZOCOR) 10 MG tablet TAKE 1 TABLET BY MOUTH EVERY DAY AT 6PM 90 tablet 1   No current facility-administered medications for this visit.     PHYSICAL EXAMINATION: ECOG PERFORMANCE STATUS: 0 - Asymptomatic  Vitals:   05/06/19 1231  BP: (!) 130/98  Pulse: 80  Resp: 18  Temp: 99.1 F (37.3 C)  SpO2: 96%   Filed Weights   05/06/19 1231  Weight: 213 lb 3.2 oz (96.7 kg)    GENERAL:alert, no distress and comfortable SKIN: skin color, texture, turgor are normal, no rashes or significant lesions EYES: normal, Conjunctiva are pink and non-injected, sclera clear  NECK: supple, thyroid normal size, non-tender, without nodularity LYMPH:  no palpable lymphadenopathy in the cervical, axillary  LUNGS: clear to auscultation and percussion with normal breathing effort HEART: regular rate & rhythm and no murmurs and no lower extremity  edema ABDOMEN:abdomen soft, non-tender and normal bowel sounds Musculoskeletal:no cyanosis of digits and no clubbing  NEURO: alert & oriented x 3 with fluent speech, no focal motor/sensory deficits BREAST: (+) Moderate skin ecchymosis at biopsy site (+)  0.5cm nodule at 3-4:00 position, tender, likely hematoma. No adenopathy bilaterally. Breast exam benign.  LABORATORY DATA:  I have reviewed the data as listed CBC Latest Ref Rng & Units 05/06/2019 02/18/2019 02/17/2019  WBC 4.0 - 10.5 K/uL 7.2 11.2(H) 11.9(H)  Hemoglobin 12.0 - 15.0 g/dL 14.0 11.6(L) 12.0  Hematocrit 36.0 - 46.0 % 43.2 36.5 38.2  Platelets 150 - 400 K/uL 253 217 234    CMP Latest Ref Rng & Units 05/06/2019 02/18/2019 02/17/2019  Glucose 70 - 99 mg/dL 97 119(H) 131(H)  BUN 8 - 23 mg/dL _0 Creatinine 0.44 - 1.00 mg/dL 0.78 0.63 0.56  Sodium 135 - 145 mmol/L 141 140 138  Potassium 3.5 - 5.1 mmol/L 3.6 3.1(L) 3.7  Chloride 98 - 111 mmol/L 106 106 105  CO2 22 - 32 mmol/L _1 Calcium 8.9 - 10.3 mg/dL 9.4 8.6(L) 8.4(L)  Total Protein 6.5 - 8.1 g/dL 6.9 - -  Total Bilirubin 0.3 - 1.2 mg/dL 0.4 - -  Alkaline Phos 38 - 126 U/L 60 - -  AST 15 - 41 U/L 15 - -  ALT 0 - 44 U/L 15 - -     RADIOGRAPHIC STUDIES: I have personally reviewed the radiological images as listed and agreed with the findings in the report. No results found.  ASSESSMENT & PLAN:  [name] is a 68 y.o. Caucasian female with a history of HTN, GERD, HLD and arthritis.   1. Ductal carcinoma in situ (DCIS) of left breast, Stage 0, ER/PR +, High Grade -I discussed her breast imaging and needle biopsy results with patient in great detail. -She is a candidate for breast conservation surgery. She has been seen by breast surgeon Dr. Marlou Starks, who recommends lumpectomy. -Her father had GI and urological cancers. Her brother had lymphoma from agent orange exposure. No high concern  for genetic mutations.  -Her DCIS will be cured by complete surgical  resection. Any form of adjuvant therapy is preventive. -We reviewed her risk for breast cancer with [facility] calculator. Based on family/PMx and lifestyle she has a 12% risk of developing breast cancer in the next 10 year.  -Given her strongly positive ER and PR, I do recommend antiestrogen therapy with Anastrozole or Tamoxifen for 5 years, which decrease her risk of future breast cancer by ~6%. I reviewed side effects, especially hot flash, mood swing, metabolism change, rebound, arthralgia, small risk of thrombosis and endometrial cancer from Tamoxifen etc. She will think about it.  -She will likely benefit from breast radiation if she undergo lumpectomy. According to the nomogram, it will decrease the risk of breast cancer by 7%. She will discuss this further with Dr Isidore Moos today.  -We also discussed that biopsy may have sampling limitation, we will review her surgical path, to see if she has any invasive carcinoma components. -We discussed breast cancer surveillance after she completes treatment, Including annual mammogram, breast exam every 6-12 months. I discussed another screening option of yearly breast MRIs to assist her mammograms. I encouraged her to check her own breast at least once a month.  -Labs reviewed, CBC and CMP WNL today. Physical exam overall unremarkable except ecchymosis and small hematoma at biopsy site.  -F/u after radiation.    2. Arthritis, Bone health  -She notes she had a normal DEXA scan in the last 1-2 years  -her arthritis is currently manageable.    3. HTN -On HCTZ and Zocor  -BP at 130/98 today (05/06/19). She does have HA in clinic today as she notes she is nervous and normally her BP is not this high. I offered tylenol, she declined   PLAN:  -She will proceed with surgery  -F/u with me after radiation to decide antiestrogen therapy    No orders of the defined types were placed in this encounter.   All questions were answered. The  patient knows to call the clinic with any problems, questions or concerns. I spent 35 minutes counseling the patient face to face. The total time spent in the appointment was 45 minutes and more than 50% was on counseling.     [name], MD 05/06/2019 4:20 PM  I, Joslyn Devon, am acting as scribe for [name], MD.   I have reviewed the above documentation for accuracy and completeness, and I agree with the above.

## 2019-05-06 ENCOUNTER — Inpatient Hospital Stay: Payer: Medicare Other

## 2019-05-06 ENCOUNTER — Other Ambulatory Visit: Payer: Self-pay

## 2019-05-06 ENCOUNTER — Ambulatory Visit
Admission: RE | Admit: 2019-05-06 | Discharge: 2019-05-06 | Disposition: A | Discharge status: Home / Self Care | Payer: Medicare Other | Source: Ambulatory Visit | Attending: Radiation Oncology | Admitting: Radiation Oncology

## 2019-05-06 ENCOUNTER — Inpatient Hospital Stay: Payer: Medicare Other | Attending: Hematology | Admitting: Hematology

## 2019-05-06 ENCOUNTER — Ambulatory Visit: Payer: Self-pay | Admitting: General Surgery

## 2019-05-06 ENCOUNTER — Encounter: Payer: Self-pay | Admitting: Hematology

## 2019-05-06 ENCOUNTER — Ambulatory Visit: Payer: Medicare Other | Admitting: Physical Therapy

## 2019-05-06 VITALS — BP 130/98 | HR 80 | Temp 99.1°F | Resp 18 | Ht 69.0 in | Wt 213.2 lb

## 2019-05-06 DIAGNOSIS — D0512 Intraductal carcinoma in situ of left breast: Secondary | ICD-10-CM | POA: Diagnosis not present

## 2019-05-06 DIAGNOSIS — Z170 Estrogen receptor positive status [ER+]: Secondary | ICD-10-CM

## 2019-05-06 DIAGNOSIS — Z807 Family history of other malignant neoplasms of lymphoid, hematopoietic and related tissues: Secondary | ICD-10-CM | POA: Diagnosis not present

## 2019-05-06 DIAGNOSIS — M1711 Unilateral primary osteoarthritis, right knee: Secondary | ICD-10-CM | POA: Insufficient documentation

## 2019-05-06 DIAGNOSIS — Z800 Family history of malignant neoplasm of digestive organs: Secondary | ICD-10-CM | POA: Diagnosis not present

## 2019-05-06 DIAGNOSIS — I10 Essential (primary) hypertension: Secondary | ICD-10-CM | POA: Diagnosis not present

## 2019-05-06 DIAGNOSIS — K219 Gastro-esophageal reflux disease without esophagitis: Secondary | ICD-10-CM | POA: Diagnosis not present

## 2019-05-06 DIAGNOSIS — Z87891 Personal history of nicotine dependence: Secondary | ICD-10-CM | POA: Diagnosis not present

## 2019-05-06 DIAGNOSIS — Z808 Family history of malignant neoplasm of other organs or systems: Secondary | ICD-10-CM | POA: Diagnosis not present

## 2019-05-06 DIAGNOSIS — Z809 Family history of malignant neoplasm, unspecified: Secondary | ICD-10-CM

## 2019-05-06 LAB — CBC WITH DIFFERENTIAL (CANCER CENTER ONLY)
Abs Immature Granulocytes: 0.02 10*3/uL (ref 0.00–0.07)
Basophils Absolute: 0 10*3/uL (ref 0.0–0.1)
Basophils Relative: 0 %
Eosinophils Absolute: 0.3 10*3/uL (ref 0.0–0.5)
Eosinophils Relative: 4 %
HCT: 43.2 % (ref 36.0–46.0)
Hemoglobin: 14 g/dL (ref 12.0–15.0)
Immature Granulocytes: 0 %
Lymphocytes Relative: 23 %
Lymphs Abs: 1.7 10*3/uL (ref 0.7–4.0)
MCH: 27.7 pg (ref 26.0–34.0)
MCHC: 32.4 g/dL (ref 30.0–36.0)
MCV: 85.5 fL (ref 80.0–100.0)
Monocytes Absolute: 0.6 10*3/uL (ref 0.1–1.0)
Monocytes Relative: 8 %
Neutro Abs: 4.6 10*3/uL (ref 1.7–7.7)
Neutrophils Relative %: 65 %
Platelet Count: 253 10*3/uL (ref 150–400)
RBC: 5.05 MIL/uL (ref 3.87–5.11)
RDW: 13.6 % (ref 11.5–15.5)
WBC Count: 7.2 10*3/uL (ref 4.0–10.5)
nRBC: 0 % (ref 0.0–0.2)

## 2019-05-06 LAB — CMP (CANCER CENTER ONLY)
ALT: 15 U/L (ref 0–44)
AST: 15 U/L (ref 15–41)
Albumin: 3.8 g/dL (ref 3.5–5.0)
Alkaline Phosphatase: 60 U/L (ref 38–126)
Anion gap: 10 (ref 5–15)
BUN: 16 mg/dL (ref 8–23)
CO2: 25 mmol/L (ref 22–32)
Calcium: 9.4 mg/dL (ref 8.9–10.3)
Chloride: 106 mmol/L (ref 98–111)
Creatinine: 0.78 mg/dL (ref 0.44–1.00)
GFR, Est AFR Am: >60 mL/min (ref >60)
GFR, Estimated: >60 mL/min (ref >60)
Glucose, Bld: 97 mg/dL (ref 70–99)
Potassium: 3.6 mmol/L (ref 3.5–5.1)
Sodium: 141 mmol/L (ref 135–145)
Total Bilirubin: 0.4 mg/dL (ref 0.3–1.2)
Total Protein: 6.9 g/dL (ref 6.5–8.1)

## 2019-05-07 ENCOUNTER — Telehealth: Payer: Self-pay | Admitting: Hematology

## 2019-05-07 ENCOUNTER — Encounter: Payer: Self-pay | Admitting: Radiation Oncology

## 2019-05-07 NOTE — Telephone Encounter (Signed)
No los per 8/19. °

## 2019-05-13 ENCOUNTER — Telehealth: Payer: Self-pay

## 2019-05-13 NOTE — Telephone Encounter (Signed)
Nutrition Assessment  Reason for Assessment:  Pt attended Breast Clinic on 8/19 and received nutrition packet from nurse navigator  ASSESSMENT:   68 year old female with new diagnosis of breast cancer.  Planning lumpectomy, adjuvant radiation and antiestrogens.  Past medical history of GERD, HTN, HLD  Spoke with patient via phone to introduce self and service at [facility].    Medications:  Prilosec, oscal, vit D, MVI, Vit C  Labs: reviewed  Anthropometrics:   Height: 69 inches Weight: 213 lb 3.2 oz BMI: 31   NUTRITION DIAGNOSIS: Food and nutrition related knowledge deficit related to new diagnosis of breast cancer as evidenced by no prior need for nutrition related information.  INTERVENTION:   Discussed briefly packet of information regarding nutritional tips for breast cancer patients.  No questions at this time.  Contact information provided and patient knows to contact me with questions/concerns.    MONITORING, EVALUATION, and GOAL: Pt will consume a healthy plant based diet to maintain lean body mass throughout treatment.   Yisel Megill B. Zenia Resides, [place], [place] Registered Dietitian [phone] (pager)

## 2019-05-14 ENCOUNTER — Telehealth: Payer: Self-pay | Admitting: *Deleted

## 2019-05-14 DIAGNOSIS — D0512 Intraductal carcinoma in situ of left breast: Secondary | ICD-10-CM

## 2019-05-14 NOTE — Telephone Encounter (Signed)
Left message to follow up after [place].

## 2019-05-21 ENCOUNTER — Encounter: Payer: Self-pay | Admitting: *Deleted

## 2019-05-27 NOTE — Progress Notes (Signed)
[facility] Psychosocial Distress Screening Clinical Social Work  Clinical Social Work was referred by distress screening protocol.  The patient scored a 3 on the Psychosocial Distress Thermometer which indicates mild distress. Clinical Social Worker met with patient in exam room to assess for distress and other psychosocial needs.  ONCBCN DISTRESS SCREENING 05/27/2019  Distress experienced in past week (1-10) 3  Emotional problem type Nervousness/Anxiety  Referral to support programs Yes   Clinical Social Worker follow up needed: No.  Counseling intern note:  I spoke with the patient on the phone today.  She stated that she has good support from family and friends. Patient reported mild anxiety and mentioned that the wait for her surgery is the main trigger.  She stated that she worries the cancer will spread and wonders if she is making the right treatment decision. The patient declined counseling services at this time, but I will check-in with the patient in 3 weeks to offer emotional and mental health support at that time.    [place] Counseling Intern  Voicemail: [phone]

## 2019-05-28 ENCOUNTER — Other Ambulatory Visit: Payer: Self-pay

## 2019-05-28 ENCOUNTER — Encounter (HOSPITAL_BASED_OUTPATIENT_CLINIC_OR_DEPARTMENT_OTHER): Payer: Self-pay | Admitting: *Deleted

## 2019-06-01 ENCOUNTER — Other Ambulatory Visit (HOSPITAL_COMMUNITY)
Admission: RE | Admit: 2019-06-01 | Discharge: 2019-06-01 | Disposition: A | Discharge status: Home / Self Care | Payer: Medicare Other | Source: Ambulatory Visit | Attending: General Surgery | Admitting: General Surgery

## 2019-06-01 DIAGNOSIS — Z01812 Encounter for preprocedural laboratory examination: Secondary | ICD-10-CM | POA: Diagnosis not present

## 2019-06-01 DIAGNOSIS — Z20828 Contact with and (suspected) exposure to other viral communicable diseases: Secondary | ICD-10-CM | POA: Insufficient documentation

## 2019-06-01 LAB — SARS CORONAVIRUS 2 (TAT 6-24 HRS): SARS Coronavirus 2: NEGATIVE

## 2019-06-03 DIAGNOSIS — D0592 Unspecified type of carcinoma in situ of left breast: Secondary | ICD-10-CM | POA: Diagnosis not present

## 2019-06-03 DIAGNOSIS — R921 Mammographic calcification found on diagnostic imaging of breast: Secondary | ICD-10-CM | POA: Diagnosis not present

## 2019-06-03 NOTE — Progress Notes (Signed)
      Enhanced Recovery after Surgery  Enhanced Recovery after Surgery is a protocol used to improve the stress on your body and your recovery after surgery.  Patient Instructions  . The night before surgery:  o No food after midnight. ONLY clear liquids after midnight  . The day of surgery (if you do NOT have diabetes):  o Drink ONE (1) Pre-Surgery Clear Ensure as directed.   o This drink was given to you during your hospital  pre-op appointment visit. o The pre-op nurse will instruct you on the time to drink the  Pre-Surgery Ensure depending on your surgery time. o Finish the drink at the designated time by the pre-op nurse.  o Nothing else to drink after completing the  Pre-Surgery Clear Ensure.  . The day of surgery (if you have diabetes): o Drink ONE (1) Gatorade 2 (G2) as directed. o This drink was given to you during your hospital  pre-op appointment visit.  o The pre-op nurse will instruct you on the time to drink the   Gatorade 2 (G2) depending on your surgery time. o Color of the Gatorade may vary. Red is not allowed. o Nothing else to drink after completing the  Gatorade 2 (G2).         If you have questions, please contact your surgeon's office. 

## 2019-06-04 ENCOUNTER — Encounter (HOSPITAL_BASED_OUTPATIENT_CLINIC_OR_DEPARTMENT_OTHER): Payer: Self-pay

## 2019-06-04 ENCOUNTER — Other Ambulatory Visit: Payer: Self-pay

## 2019-06-04 ENCOUNTER — Ambulatory Visit (HOSPITAL_BASED_OUTPATIENT_CLINIC_OR_DEPARTMENT_OTHER): Payer: Medicare Other | Admitting: Certified Registered"

## 2019-06-04 ENCOUNTER — Ambulatory Visit (HOSPITAL_BASED_OUTPATIENT_CLINIC_OR_DEPARTMENT_OTHER)
Admission: RE | Admit: 2019-06-04 | Discharge: 2019-06-04 | Disposition: A | Discharge status: Home / Self Care | Payer: Medicare Other | Attending: General Surgery | Admitting: General Surgery

## 2019-06-04 ENCOUNTER — Encounter (HOSPITAL_BASED_OUTPATIENT_CLINIC_OR_DEPARTMENT_OTHER)
Admission: RE | Disposition: A | Discharge status: Home / Self Care | Payer: Self-pay | Source: Home / Self Care | Attending: General Surgery

## 2019-06-04 DIAGNOSIS — Z87891 Personal history of nicotine dependence: Secondary | ICD-10-CM | POA: Insufficient documentation

## 2019-06-04 DIAGNOSIS — E785 Hyperlipidemia, unspecified: Secondary | ICD-10-CM | POA: Diagnosis not present

## 2019-06-04 DIAGNOSIS — I10 Essential (primary) hypertension: Secondary | ICD-10-CM | POA: Insufficient documentation

## 2019-06-04 DIAGNOSIS — R921 Mammographic calcification found on diagnostic imaging of breast: Secondary | ICD-10-CM | POA: Diagnosis not present

## 2019-06-04 DIAGNOSIS — F419 Anxiety disorder, unspecified: Secondary | ICD-10-CM | POA: Diagnosis not present

## 2019-06-04 DIAGNOSIS — D0512 Intraductal carcinoma in situ of left breast: Secondary | ICD-10-CM | POA: Diagnosis not present

## 2019-06-04 DIAGNOSIS — C50512 Malignant neoplasm of lower-outer quadrant of left female breast: Secondary | ICD-10-CM | POA: Diagnosis not present

## 2019-06-04 HISTORY — PX: BREAST LUMPECTOMY WITH RADIOACTIVE SEED LOCALIZATION: SHX6424

## 2019-06-04 SURGERY — BREAST LUMPECTOMY WITH RADIOACTIVE SEED LOCALIZATION
Anesthesia: General | Site: Breast | Laterality: Left

## 2019-06-04 MED ORDER — MEPERIDINE HCL 25 MG/ML IJ SOLN: 6.2500 mg | INTRAMUSCULAR | Status: DC | PRN

## 2019-06-04 MED ORDER — BUPIVACAINE-EPINEPHRINE (PF) 0.25% -1:200000 IJ SOLN
INTRAMUSCULAR | Status: DC | PRN
  Administered 2019-06-04: 20 mL

## 2019-06-04 MED ORDER — ACETAMINOPHEN 500 MG PO TABS
ORAL_TABLET | ORAL | Status: AC
  Filled 2019-06-04: qty 2

## 2019-06-04 MED ORDER — MIDAZOLAM HCL 2 MG/2ML IJ SOLN: 1.0000 mg | INTRAMUSCULAR | Status: DC | PRN

## 2019-06-04 MED ORDER — ACETAMINOPHEN 500 MG PO TABS
1000.0000 mg | ORAL_TABLET | ORAL | Status: AC
  Administered 2019-06-04: 1000 mg via ORAL

## 2019-06-04 MED ORDER — CELECOXIB 200 MG PO CAPS
ORAL_CAPSULE | ORAL | Status: AC
  Filled 2019-06-04: qty 1

## 2019-06-04 MED ORDER — CEFAZOLIN SODIUM-DEXTROSE 2-3 GM-%(50ML) IV SOLR
INTRAVENOUS | Status: DC | PRN
  Administered 2019-06-04: 2 g via INTRAVENOUS

## 2019-06-04 MED ORDER — CEFAZOLIN SODIUM-DEXTROSE 2-4 GM/100ML-% IV SOLN
INTRAVENOUS | Status: AC
  Filled 2019-06-04: qty 100

## 2019-06-04 MED ORDER — ONDANSETRON HCL 4 MG/2ML IJ SOLN
INTRAMUSCULAR | Status: AC
  Filled 2019-06-04: qty 2

## 2019-06-04 MED ORDER — HYDROMORPHONE HCL 1 MG/ML IJ SOLN
0.2500 mg | INTRAMUSCULAR | Status: DC | PRN
  Administered 2019-06-04 (×2): 0.5 mg via INTRAVENOUS

## 2019-06-04 MED ORDER — CHLORHEXIDINE GLUCONATE CLOTH 2 % EX PADS: 6.0000 | MEDICATED_PAD | Freq: Once | CUTANEOUS | Status: DC

## 2019-06-04 MED ORDER — HYDROMORPHONE HCL 1 MG/ML IJ SOLN
INTRAMUSCULAR | Status: AC
  Filled 2019-06-04: qty 0.5

## 2019-06-04 MED ORDER — ONDANSETRON HCL 4 MG/2ML IJ SOLN
INTRAMUSCULAR | Status: DC | PRN
  Administered 2019-06-04: 4 mg via INTRAVENOUS

## 2019-06-04 MED ORDER — BUPIVACAINE-EPINEPHRINE (PF) 0.25% -1:200000 IJ SOLN
INTRAMUSCULAR | Status: AC
  Filled 2019-06-04: qty 30

## 2019-06-04 MED ORDER — FENTANYL CITRATE (PF) 100 MCG/2ML IJ SOLN
INTRAMUSCULAR | Status: AC
  Filled 2019-06-04: qty 2

## 2019-06-04 MED ORDER — DEXAMETHASONE SODIUM PHOSPHATE 10 MG/ML IJ SOLN
INTRAMUSCULAR | Status: AC
  Filled 2019-06-04: qty 1

## 2019-06-04 MED ORDER — MIDAZOLAM HCL 2 MG/2ML IJ SOLN
INTRAMUSCULAR | Status: AC
  Filled 2019-06-04: qty 2

## 2019-06-04 MED ORDER — OXYCODONE HCL 5 MG PO TABS: 5.0000 mg | ORAL_TABLET | Freq: Once | ORAL | Status: DC | PRN

## 2019-06-04 MED ORDER — EPHEDRINE SULFATE 50 MG/ML IJ SOLN
INTRAMUSCULAR | Status: DC | PRN
  Administered 2019-06-04: 15 mg via INTRAVENOUS
  Administered 2019-06-04: 10 mg via INTRAVENOUS

## 2019-06-04 MED ORDER — DEXAMETHASONE SODIUM PHOSPHATE 10 MG/ML IJ SOLN
INTRAMUSCULAR | Status: DC | PRN
  Administered 2019-06-04: 10 mg via INTRAVENOUS

## 2019-06-04 MED ORDER — CELECOXIB 200 MG PO CAPS
200.0000 mg | ORAL_CAPSULE | ORAL | Status: AC
  Administered 2019-06-04: 07:00:00 200 mg via ORAL

## 2019-06-04 MED ORDER — LIDOCAINE HCL (CARDIAC) PF 100 MG/5ML IV SOSY
PREFILLED_SYRINGE | INTRAVENOUS | Status: DC | PRN
  Administered 2019-06-04: 100 mg via INTRAVENOUS

## 2019-06-04 MED ORDER — PROPOFOL 10 MG/ML IV BOLUS
INTRAVENOUS | Status: AC
  Filled 2019-06-04: qty 40

## 2019-06-04 MED ORDER — GABAPENTIN 300 MG PO CAPS
300.0000 mg | ORAL_CAPSULE | ORAL | Status: AC
  Administered 2019-06-04: 300 mg via ORAL

## 2019-06-04 MED ORDER — LIDOCAINE 2% (20 MG/ML) 5 ML SYRINGE
INTRAMUSCULAR | Status: AC
  Filled 2019-06-04: qty 5

## 2019-06-04 MED ORDER — FENTANYL CITRATE (PF) 100 MCG/2ML IJ SOLN
50.0000 ug | INTRAMUSCULAR | Status: DC | PRN
  Administered 2019-06-04: 50 ug via INTRAVENOUS

## 2019-06-04 MED ORDER — HYDROCODONE-ACETAMINOPHEN 5-325 MG PO TABS: 1.0000 | ORAL_TABLET | Freq: Four times a day (QID) | ORAL | 0 refills | Status: DC | PRN

## 2019-06-04 MED ORDER — PROMETHAZINE HCL 25 MG/ML IJ SOLN: 6.2500 mg | INTRAMUSCULAR | Status: DC | PRN

## 2019-06-04 MED ORDER — PROPOFOL 10 MG/ML IV BOLUS
INTRAVENOUS | Status: DC | PRN
  Administered 2019-06-04: 150 mg via INTRAVENOUS

## 2019-06-04 MED ORDER — CEFAZOLIN SODIUM-DEXTROSE 2-4 GM/100ML-% IV SOLN: 2.0000 g | INTRAVENOUS | Status: DC

## 2019-06-04 MED ORDER — OXYCODONE HCL 5 MG/5ML PO SOLN: 5.0000 mg | Freq: Once | ORAL | Status: DC | PRN

## 2019-06-04 MED ORDER — LACTATED RINGERS IV SOLN
INTRAVENOUS | Status: DC
  Administered 2019-06-04 (×2): via INTRAVENOUS

## 2019-06-04 MED ORDER — GABAPENTIN 300 MG PO CAPS
ORAL_CAPSULE | ORAL | Status: AC
  Filled 2019-06-04: qty 1

## 2019-06-04 SURGICAL SUPPLY — 47 items
ADH SKN CLS APL DERMABOND .7 (GAUZE/BANDAGES/DRESSINGS) ×1
APL PRP STRL LF DISP 70% ISPRP (MISCELLANEOUS) ×1
APPLIER CLIP 9.375 MED OPEN (MISCELLANEOUS) ×2
APR CLP MED 9.3 20 MLT OPN (MISCELLANEOUS) ×1
BLADE SURG 15 STRL LF DISP TIS (BLADE) ×1 IMPLANT
BLADE SURG 15 STRL SS (BLADE) ×2
CANISTER SUC SOCK COL 7IN (MISCELLANEOUS) ×1 IMPLANT
CANISTER SUCT 1200ML W/VALVE (MISCELLANEOUS) ×1 IMPLANT
CHLORAPREP W/TINT 26 (MISCELLANEOUS) ×2 IMPLANT
CLIP APPLIE 9.375 MED OPEN (MISCELLANEOUS) IMPLANT
COVER BACK TABLE REUSABLE LG (DRAPES) ×2 IMPLANT
COVER MAYO STAND REUSABLE (DRAPES) ×2 IMPLANT
COVER PROBE W GEL 5X96 (DRAPES) ×2 IMPLANT
COVER WAND RF STERILE (DRAPES) IMPLANT
DECANTER SPIKE VIAL GLASS SM (MISCELLANEOUS) IMPLANT
DERMABOND ADVANCED (GAUZE/BANDAGES/DRESSINGS) ×1
DERMABOND ADVANCED .7 DNX12 (GAUZE/BANDAGES/DRESSINGS) ×1 IMPLANT
DRAPE LAPAROSCOPIC ABDOMINAL (DRAPES) ×2 IMPLANT
DRAPE UTILITY XL STRL (DRAPES) ×2 IMPLANT
ELECT COATED BLADE 2.86 ST (ELECTRODE) ×2 IMPLANT
ELECT REM PT RETURN 9FT ADLT (ELECTROSURGICAL) ×2
ELECTRODE REM PT RTRN 9FT ADLT (ELECTROSURGICAL) ×1 IMPLANT
GLOVE BIO SURGEON STRL SZ 6.5 (GLOVE) ×1 IMPLANT
GLOVE BIO SURGEON STRL SZ7.5 (GLOVE) ×4 IMPLANT
GLOVE BIOGEL PI IND STRL 7.0 (GLOVE) IMPLANT
GLOVE BIOGEL PI INDICATOR 7.0 (GLOVE) ×3
GLOVE SURG SS PI 6.5 STRL IVOR (GLOVE) ×2 IMPLANT
GOWN STRL REUS W/ TWL LRG LVL3 (GOWN DISPOSABLE) ×2 IMPLANT
GOWN STRL REUS W/TWL LRG LVL3 (GOWN DISPOSABLE) ×6
ILLUMINATOR WAVEGUIDE N/F (MISCELLANEOUS) IMPLANT
KIT MARKER MARGIN INK (KITS) ×2 IMPLANT
LIGHT WAVEGUIDE WIDE FLAT (MISCELLANEOUS) IMPLANT
NDL HYPO 25X1 1.5 SAFETY (NEEDLE) IMPLANT
NEEDLE HYPO 25X1 1.5 SAFETY (NEEDLE) ×2 IMPLANT
NS IRRIG 1000ML POUR BTL (IV SOLUTION) IMPLANT
PACK BASIN DAY SURGERY FS (CUSTOM PROCEDURE TRAY) ×2 IMPLANT
PENCIL BUTTON HOLSTER BLD 10FT (ELECTRODE) ×2 IMPLANT
SLEEVE SCD COMPRESS KNEE MED (MISCELLANEOUS) ×2 IMPLANT
SPONGE LAP 18X18 RF (DISPOSABLE) ×2 IMPLANT
SUT MON AB 4-0 PC3 18 (SUTURE) ×2 IMPLANT
SUT SILK 2 0 SH (SUTURE) IMPLANT
SUT VICRYL 3-0 CR8 SH (SUTURE) ×2 IMPLANT
SYR CONTROL 10ML LL (SYRINGE) ×1 IMPLANT
TOWEL GREEN STERILE FF (TOWEL DISPOSABLE) ×2 IMPLANT
TRAY FAXITRON CT DISP (TRAY / TRAY PROCEDURE) ×2 IMPLANT
TUBE CONNECTING 20X1/4 (TUBING) ×1 IMPLANT
YANKAUER SUCT BULB TIP NO VENT (SUCTIONS) IMPLANT

## 2019-06-04 NOTE — Anesthesia Procedure Notes (Signed)
Procedure Name: LMA Insertion Performed by: Verita Lamb, CRNA Pre-anesthesia Checklist: Patient identified, Suction available, Emergency Drugs available, Patient being monitored and Timeout performed Patient Re-evaluated:Patient Re-evaluated prior to induction Oxygen Delivery Method: Circle system utilized Preoxygenation: Pre-oxygenation with 100% oxygen Induction Type: IV induction LMA: LMA inserted LMA Size: 4.0 Number of attempts: 1 Placement Confirmation: positive ETCO2,  CO2 detector and breath sounds checked- equal and bilateral Dental Injury: Teeth and Oropharynx as per pre-operative assessment

## 2019-06-04 NOTE — Progress Notes (Signed)
Pt states had heel surgery last year and has MRSA infection in left heel

## 2019-06-04 NOTE — Discharge Instructions (Signed)
°  Post Anesthesia Home Care Instructions  Activity: Get plenty of rest for the remainder of the day. A responsible individual must stay with you for 24 hours following the procedure.  For the next 24 hours, DO NOT: -Drive a car -Paediatric nurse -Drink alcoholic beverages -Take any medication unless instructed by your physician -Make any legal decisions or sign important papers.  Meals: Start with liquid foods such as gelatin or soup. Progress to regular foods as tolerated. Avoid greasy, spicy, heavy foods. If nausea and/or vomiting occur, drink only clear liquids until the nausea and/or vomiting subsides. Call your physician if vomiting continues.  Special Instructions/Symptoms: Your throat may feel dry or sore from the anesthesia or the breathing tube placed in your throat during surgery. If this causes discomfort, gargle with warm salt water. The discomfort should disappear within 24 hours.  If you had a scopolamine patch placed behind your ear for the management of post- operative nausea and/or vomiting:  1. The medication in the patch is effective for 72 hours, after which it should be removed.  Wrap patch in a tissue and discard in the trash. Wash hands thoroughly with soap and water. 2. You may remove the patch earlier than 72 hours if you experience unpleasant side effects which may include dry mouth, dizziness or visual disturbances. 3. Avoid touching the patch. Wash your hands with soap and water after contact with the patch.     No tylenol until after 1pm today.  No ibuprofen until after 3pm today.

## 2019-06-04 NOTE — H&P (Signed)
[name]  Location: [facility] Patient #: [id number] DOB: 1951/08/02 Undefined / Language: Undefined / Race: Refused to Report/Unreported Female   History of Present Illness  The patient is a 68 year old female who presents with breast cancer.We are asked to see the patient in consultation by Dr. Isidore Moos to evaluate her for a new left breast cancer. The patient is a 68 year old white female who presents with a 36mm area of calcs in the UOQ of the left breast. The axilla looked neg. This was biopsied and came back as a High grade dcis that was ER and PR +. She does not smoke. Her family history is positive for bladder and stomach cancer in her father and lymphoma in her brother.   Past Surgical History  Breast Biopsy  Left. Foot Surgery  Left. Knee Surgery  Bilateral. Oral Surgery  Tonsillectomy   Diagnostic Studies History  Colonoscopy  1-5 years ago Mammogram  within last year Pap Smear  1-5 years ago  Medication History  Medications Reconciled  Social History Alcohol use  Occasional alcohol use. Caffeine use  Coffee. No drug use  Tobacco use  Former smoker.  Family History Alcohol Abuse  Father. Arthritis  Father, Mother. Cancer  Brother, Father. Depression  Father. Diabetes Mellitus  Family Members In General. Heart Disease  Brother, Mother. Heart disease in female family member before age 12  Hypertension  Father. Migraine Headache  Mother. Respiratory Condition  Family Members In General.  Pregnancy / Birth History  Age at menarche  56 years. Age of menopause  20-60 Gravida  4 Maternal age  52-20 Para  3  Other Problems  High blood pressure  Hypercholesterolemia     Review of Systems  General Not Present- Appetite Loss, Chills, Fatigue, Fever, Night Sweats, Weight Gain and Weight Loss. Skin Not Present- Change in Wart/Mole, Dryness, Hives, Jaundice, New Lesions, Non-Healing Wounds, Rash and Ulcer. HEENT Not  Present- Earache, Hearing Loss, Hoarseness, Nose Bleed, Oral Ulcers, Ringing in the Ears, Seasonal Allergies, Sinus Pain, Sore Throat, Visual Disturbances, Wears glasses/contact lenses and Yellow Eyes. Respiratory Not Present- Bloody sputum, Chronic Cough, Difficulty Breathing, Snoring and Wheezing. Breast Not Present- Breast Mass, Breast Pain, Nipple Discharge and Skin Changes. Cardiovascular Not Present- Chest Pain, Difficulty Breathing Lying Down, Leg Cramps, Palpitations, Rapid Heart Rate, Shortness of Breath and Swelling of Extremities. Gastrointestinal Not Present- Abdominal Pain, Bloating, Bloody Stool, Change in Bowel Habits, Chronic diarrhea, Constipation, Difficulty Swallowing, Excessive gas, Gets full quickly at meals, Hemorrhoids, Indigestion, Nausea, Rectal Pain and Vomiting. Female Genitourinary Not Present- Frequency, Nocturia, Painful Urination, Pelvic Pain and Urgency. Musculoskeletal Not Present- Back Pain, Joint Pain, Joint Stiffness, Muscle Pain, Muscle Weakness and Swelling of Extremities. Neurological Not Present- Decreased Memory, Fainting, Headaches, Numbness, Seizures, Tingling, Tremor, Trouble walking and Weakness. Psychiatric Not Present- Anxiety, Bipolar, Change in Sleep Pattern, Depression, Fearful and Frequent crying. Endocrine Not Present- Cold Intolerance, Excessive Hunger, Hair Changes, Heat Intolerance, Hot flashes and New Diabetes. Hematology Not Present- Blood Thinners, Easy Bruising, Excessive bleeding, Gland problems, HIV and Persistent Infections.   Physical Exam  General Mental Status-Alert. General Appearance-Consistent with stated age. Hydration-Well hydrated. Voice-Normal.  Head and Neck Head-normocephalic, atraumatic with no lesions or palpable masses. Trachea-midline. Thyroid Gland Characteristics - normal size and consistency.  Eye Eyeball - Bilateral-Extraocular movements intact. Sclera/Conjunctiva - Bilateral-No scleral  icterus.  Chest and Lung Exam Chest and lung exam reveals -quiet, even and easy respiratory effort with no use of accessory muscles and  on auscultation, normal breath sounds, no adventitious sounds and normal vocal resonance. Inspection Chest Wall - Normal. Back - normal.  Breast Note: There is no palpable mass in either breast. there is no palpable axillary, supraclavicular, or cervical lymphadenopathy   Cardiovascular Cardiovascular examination reveals -normal heart sounds, regular rate and rhythm with no murmurs and normal pedal pulses bilaterally.  Abdomen Inspection Inspection of the abdomen reveals - No Hernias. Skin - Scar - no surgical scars. Palpation/Percussion Palpation and Percussion of the abdomen reveal - Soft, Non Tender, No Rebound tenderness, No Rigidity (guarding) and No hepatosplenomegaly. Auscultation Auscultation of the abdomen reveals - Bowel sounds normal.  Neurologic Neurologic evaluation reveals -alert and oriented x 3 with no impairment of recent or remote memory. Mental Status-Normal.  Musculoskeletal Normal Exam - Left-Upper Extremity Strength Normal and Lower Extremity Strength Normal. Normal Exam - Right-Upper Extremity Strength Normal and Lower Extremity Strength Normal.  Lymphatic Head & Neck  General Head & Neck Lymphatics: Bilateral - Description - Normal. Axillary  General Axillary Region: Bilateral - Description - Normal. Tenderness - Non Tender. Femoral & Inguinal  Generalized Femoral & Inguinal Lymphatics: Bilateral - Description - Normal. Tenderness - Non Tender.    Assessment & Plan  DUCTAL CARCINOMA IN SITU (DCIS) OF LEFT BREAST (D05.12) Impression: The patient appears to have a small area of dcis in the UOQ of the left breast. I have talked to her about the different options for treatment and at this point she favors breast conservation which I think is very reasonable. She will not need a node evaluation. I have  discussed with her in detail the risks and benefits of the surgery as well as some of the technical aspects including the radioactive seed localization and she understands and wishes to proceed  Current Plans Referred to Oncology, for evaluation and follow up (Oncology). Routine.

## 2019-06-04 NOTE — Transfer of Care (Signed)
Immediate Anesthesia Transfer of Care Note  Patient: [name]  Procedure(s) Performed: LEFT BREAST LUMPECTOMY WITH RADIOACTIVE SEED LOCALIZATION (Left Breast)  Patient Location: PACU  Anesthesia Type:General  Level of Consciousness: awake, alert  and oriented  Airway & Oxygen Therapy: Patient Spontanous Breathing and Patient connected to face mask oxygen  Post-op Assessment: Report given to RN and Post -op Vital signs reviewed and stable  Post vital signs: Reviewed and stable  Last Vitals:  Vitals Value Taken Time  BP    Temp    Pulse 99 06/04/19 0939  Resp    SpO2 98 % 06/04/19 0939  Vitals shown include unvalidated device data.  Last Pain:  Vitals:   06/04/19 0651  TempSrc: Oral  PainSc: 5       Patients Stated Pain Goal: 4 ([id number])  Complications: No apparent anesthesia complications

## 2019-06-04 NOTE — Op Note (Signed)
06/04/2019  9:40 AM  PATIENT:  [name]  68 y.o. female  PRE-OPERATIVE DIAGNOSIS:  LEFT BREAST DUCTAL CARCINOMA IN SITU  POST-OPERATIVE DIAGNOSIS:  LEFT BREAST DUCTAL CARCINOMA IN SITU  PROCEDURE:  Procedure(s): LEFT BREAST LUMPECTOMY WITH RADIOACTIVE SEED LOCALIZATION (Left)  SURGEON:  Surgeon(s) and Role:    * [name], MD - Primary  PHYSICIAN ASSISTANT:   ASSISTANTS: none   ANESTHESIA:   local and general  EBL:  minimal   BLOOD ADMINISTERED:none  DRAINS: none   LOCAL MEDICATIONS USED:  MARCAINE     SPECIMEN:  Source of Specimen:  left breast tissue with additional superior, medial, and deep margins  DISPOSITION OF SPECIMEN:  PATHOLOGY  COUNTS:  YES  TOURNIQUET:  * No tourniquets in log *  DICTATION: .Dragon Dictation   After informed consent was obtained the patient was brought to the operating room and placed in the supine position on the operating table.  After adequate induction of general anesthesia the patient's left chest and breast and axillary area were prepped with ChloraPrep, allowed to dry, and draped in usual sterile manner.  An appropriate timeout was performed.  Previously an I-125 seed was placed in the lower outer quadrant of the left breast to mark an area of ductal carcinoma in situ.  The neoprobe was set to I-125 and the area of radioactivity was readily identified.  The radioactive seed signal was very superficial to the skin.  For this reason I elected to make a half-moon shaped incision in the skin overlying the area of radioactivity with a 15 blade knife.  The incision was carried through the skin and subcutaneous tissue sharply with the electrocautery.  A circular portion of breast tissue was then excised sharply around the radioactive seed while checking the area of radioactivity frequently.  Once the specimen was removed it was oriented with the appropriate paint colors.  A specimen radiograph was obtained that showed the clip and seed  to be in the specimen.  Based off the image I did elect to take additional superior, medial, and deep margins.  These were marked appropriately.  Hemostasis was achieved using the Bovie electrocautery.  The wound was irrigated with saline and infiltrated with more quarter percent Marcaine.  The cavity was marked with clips.  The deep layer of the wound was then closed with layers of interrupted 3-0 Vicryl stitches.  The skin was then closed with a running 4-0 Monocryl subcuticular stitch.  Dermabond dressings were applied.  The patient tolerated the procedure well.  At the end of the case all needle sponge and instrument counts were correct.  The patient was then awakened and taken to recovery in stable condition.  PLAN OF CARE: Discharge to home after PACU  PATIENT DISPOSITION:  PACU - hemodynamically stable.   Delay start of Pharmacological VTE agent (>24hrs) due to surgical blood loss or risk of bleeding: not applicable

## 2019-06-04 NOTE — Anesthesia Preprocedure Evaluation (Addendum)
Anesthesia Evaluation  Patient identified by MRN, date of birth, ID band Patient awake    Reviewed: Allergy & Precautions, NPO status , Patient's Chart, lab work & pertinent test results  Airway Mallampati: II  TM Distance: >3 FB Neck ROM: Full    Dental no notable dental hx. (+) Teeth Intact, Dental Advisory Given   Pulmonary former smoker,    Pulmonary exam normal breath sounds clear to auscultation       Cardiovascular hypertension, Pt. on medications Normal cardiovascular exam Rhythm:Regular Rate:Normal  ekg SB   Neuro/Psych PSYCHIATRIC DISORDERS Anxiety negative neurological ROS     GI/Hepatic Neg liver ROS, GERD  ,  Endo/Other  negative endocrine ROS  Renal/GU negative Renal ROS     Musculoskeletal  (+) Arthritis ,   Abdominal   Peds  Hematology negative hematology ROS (+)   Anesthesia Other Findings   Reproductive/Obstetrics                            Lab Results  Component Value Date   CREATININE 0.78 05/06/2019   BUN 16 05/06/2019   NA 141 05/06/2019   K 3.6 05/06/2019   CL 106 05/06/2019   CO2 25 05/06/2019   Lab Results  Component Value Date   WBC 7.2 05/06/2019   HGB 14.0 05/06/2019   HCT 43.2 05/06/2019   MCV 85.5 05/06/2019   PLT 253 05/06/2019    Anesthesia Physical  Anesthesia Plan  ASA: II  Anesthesia Plan: General   Post-op Pain Management:    Induction: Intravenous  PONV Risk Score and Plan: 2 and Treatment may vary due to age or medical condition, Ondansetron and Dexamethasone  Airway Management Planned: LMA  Additional Equipment: None  Intra-op Plan:   Post-operative Plan: Extubation in OR  Informed Consent: I have reviewed the patients History and Physical, chart, labs and discussed the procedure including the risks, benefits and alternatives for the proposed anesthesia with the patient or authorized representative who has indicated  his/her understanding and acceptance.     Dental advisory given  Plan Discussed with: CRNA  Anesthesia Plan Comments:         Anesthesia Quick Evaluation

## 2019-06-04 NOTE — Interval H&P Note (Signed)
History and Physical Interval Note:  06/04/2019 8:27 AM  [name]  has presented today for surgery, with the diagnosis of LEFT BREAST DUCTAL CARCINOMA IN SITU.  The various methods of treatment have been discussed with the patient and family. After consideration of risks, benefits and other options for treatment, the patient has consented to  Procedure(s): LEFT BREAST LUMPECTOMY WITH RADIOACTIVE SEED LOCALIZATION (Left) as a surgical intervention.  The patient's history has been reviewed, patient examined, no change in status, stable for surgery.  I have reviewed the patient's chart and labs.  Questions were answered to the patient's satisfaction.     [name]

## 2019-06-05 NOTE — Anesthesia Postprocedure Evaluation (Signed)
Anesthesia Post Note  Patient: [name]  Procedure(s) Performed: LEFT BREAST LUMPECTOMY WITH RADIOACTIVE SEED LOCALIZATION (Left Breast)     Patient location during evaluation: PACU Anesthesia Type: General Level of consciousness: sedated and patient cooperative Pain management: pain level controlled Vital Signs Assessment: post-procedure vital signs reviewed and stable Respiratory status: spontaneous breathing Cardiovascular status: stable Anesthetic complications: no    Last Vitals:  Vitals:   06/04/19 1045 06/04/19 1109  BP: 129/77 133/79  Pulse: (!) 56 (!) 59  Resp: 16 18  Temp: 36.5 C   SpO2: 96% 96%    Last Pain:  Vitals:   06/05/19 0914  TempSrc:   PainSc: [place]

## 2019-06-08 ENCOUNTER — Encounter (HOSPITAL_BASED_OUTPATIENT_CLINIC_OR_DEPARTMENT_OTHER): Payer: Self-pay | Admitting: General Surgery

## 2019-06-08 LAB — SURGICAL PATHOLOGY

## 2019-06-12 ENCOUNTER — Encounter: Payer: Self-pay | Admitting: *Deleted

## 2019-06-19 NOTE — Progress Notes (Signed)
Location of Breast Cancer: Left Breast  Histology per Pathology Report:  04/27/19 Diagnosis Breast, left, needle core biopsy, lower outer calcification, 10cmfn - DUCTAL CARCINOMA IN SITU WITH CALCIFICATIONS  Receptor Status: ER(100%), PR (80%)  06/04/19 DIAGNOSIS: A. BREAST, LEFT, LUMPECTOMY: - Ductal carcinoma in situ, intermediate grade, 0.3 cm - Margins uninvolved by carcinoma (0.2 cm; medial margin) - Previous biopsy site changes - See oncology table below B. BREAST, LEFT SUPERIOR MARGIN, EXCISION: - Focal residual ductal carcinoma in situ - Margins uninvolved by carcinoma (0.3 cm) C. BREAST, LEFT MEDIAL MARGIN, EXCISION: - No residual carcinoma identified D. BREAST, LEFT DEEP MARGIN, EXCISION: - No residual carcinoma identified   Did patient present with symptoms or was this found on screening mammography?: It was found on a screening mammogram.   Past/Anticipated interventions by surgeon, if any: 06/04/19 PROCEDURE:  Procedure(s): LEFT BREAST LUMPECTOMY WITH RADIOACTIVE SEED LOCALIZATION (Left) SURGEON:  Surgeon(s) and Role:    [name], MD - Primary  Past/Anticipated interventions by medical oncology, if any:  05/06/19 Dr. Burr Medico, breast clinic: PLAN:  -She will proceed with surgery  -F/u with me after radiation to decide antiestrogen therapy   Lymphedema issues, if any:  She denies.   Pain issues, if any:  She denies.   SAFETY ISSUES:  Prior radiation? No  Pacemaker/ICD? No  Possible current pregnancy? No  Is the patient on methotrexate? No  Current Complaints / other details:      Sherrilynn Gudgel, Stephani Police, RN 06/19/2019,2:45 PM

## 2019-06-25 ENCOUNTER — Telehealth: Payer: Self-pay | Admitting: Radiation Oncology

## 2019-06-25 NOTE — Telephone Encounter (Signed)
Confirmed telephone appt for 10/9 and verified pt's demographics

## 2019-06-25 NOTE — Progress Notes (Signed)
Radiation Oncology         [phone] ________________________________  Name: [name] MRN: [id number]  Date: 06/26/2019  DOB: 28-Nov-1950  Follow-Up Visit Note by telephone as patient was unable to access WebEx during pandemic precautions  Outpatient  CC: [name], MD  [name], MD  Diagnosis:      ICD-10-CM   1. Ductal carcinoma in situ (DCIS) of left breast  D05.12      Cancer Staging Ductal carcinoma in situ (DCIS) of left breast Staging form: Breast, AJCC 8th Edition - Clinical: Stage 0 (cTis (DCIS), cN0, cM0, ER+, PR+, HER2: Not Assessed) - Signed by [name], MD on 05/06/2019   CHIEF COMPLAINT: Here to discuss management of left breast DCIS  Narrative:  The patient returns today for follow-up to discuss radiation treatment options. She was seen in the multidisciplinary breast clinic on 05/06/2019.   She opted to proceed with left breast lumpectomy on date of 06/04/2019 with pathology report revealing: DCIS size of 0.3 cm; margin status to in situ disease of uninvolved (0.2 cm); Grade 2. Prognostic panel was not repeated (ER/PR+)  Symptomatically, the patient reports: doing well.         ALLERGIES:  has No Known Allergies.  Meds: Current Outpatient Medications  Medication Sig Dispense Refill  . Ascorbic Acid (VITAMIN C) 1000 MG tablet Take 1,000 mg by mouth daily.    Marland Kitchen aspirin EC 81 MG tablet Take 81 mg by mouth daily.    . calcium carbonate (OS-CAL) 600 MG TABS tablet Take 1 tablet (600 mg total) by mouth daily. 60 tablet 1  . cholecalciferol (VITAMIN D) 1000 units tablet Take 1 tablet (1,000 Units total) by mouth daily. 30 tablet 1  . hydrochlorothiazide (HYDRODIURIL) 25 MG tablet TAKE 1 TABLET(25 MG) BY MOUTH DAILY 90 tablet 3  . Multiple Vitamin (MULTIVITAMIN) tablet Take 1 tablet by mouth daily. 30 tablet 1  . omeprazole (PRILOSEC OTC) 20 MG tablet Take 20 mg by mouth at bedtime.     . simvastatin (ZOCOR) 10 MG tablet TAKE 1 TABLET BY MOUTH EVERY  DAY AT 6PM 90 tablet 1  . HYDROcodone-acetaminophen (NORCO/VICODIN) 5-325 MG tablet Take 1-2 tablets by mouth every 6 (six) hours as needed for moderate pain or severe pain. (Patient not taking: Reported on 06/26/2019) 15 tablet 0   No current facility-administered medications for this encounter.     Physical Findings:  vitals were not taken for this visit. .     General: Alert and oriented, in no acute distress   Lab Findings: Lab Results  Component Value Date   WBC 7.2 05/06/2019   HGB 14.0 05/06/2019   HCT 43.2 05/06/2019   MCV 85.5 05/06/2019   PLT 253 05/06/2019      Radiographic Findings: No results found.  Impression/Plan: Left Breast DCIS  We discussed adjuvant radiotherapy today.  I recommend 4 weeks directed at the left breast in order to reduce risk of recurrence to the left breast by half.  The risks, benefits and side effects of this treatment were discussed in detail.  She understands that radiotherapy is associated with skin irritation and fatigue in the acute setting. Late effects can include cosmetic changes and rare injury to internal organs.  She is enthusiastic about proceeding with treatment.   A total of 3 medically necessary complex treatment devices will be fabricated and supervised by me: 2 fields with MLCs for custom blocks to protect heart, and lungs;  and, a Vac-lok.  MORE COMPLEX DEVICES MAY BE MADE IN DOSIMETRY FOR FIELD IN FIELD BEAMS FOR DOSE HOMOGENEITY.  I have requested : 3D Simulation which is medically necessary to give adequate dose to at risk tissues while sparing lungs and heart.  I have requested a DVH of the following structures: lungs, heart, left lumpectomy cavity.    The patient will receive 40.05 Gy in 15 fractions to the left breast with 2 fields.  This will be  followed by a boost. Simulation on 06-30-19.  This encounter was provided by telemedicine by telephone as patient was unable to access WebEx during pandemic precautions The  patient has given verbal consent for this type of encounter and has been advised to only accept a meeting of this type in a secure network environment. The time spent during this encounter was 15 minutes. The attendants for this meeting include [name]  and Marcelline Mates.  During the encounter, [name] was located at home office. DENAI CABA was located at home.  _____________________________________   [name], MD   This document serves as a record of services personally performed by [name], MD. It was created on her behalf by Wilburn Mylar, a trained medical scribe. The creation of this record is based on the scribe's personal observations and the provider's statements to them. This document has been checked and approved by the attending provider.

## 2019-06-26 ENCOUNTER — Other Ambulatory Visit: Payer: Self-pay

## 2019-06-26 ENCOUNTER — Encounter: Payer: Self-pay | Admitting: *Deleted

## 2019-06-26 ENCOUNTER — Ambulatory Visit
Admission: RE | Admit: 2019-06-26 | Discharge: 2019-06-26 | Disposition: A | Discharge status: Home / Self Care | Payer: Medicare Other | Source: Ambulatory Visit | Attending: Radiation Oncology | Admitting: Radiation Oncology

## 2019-06-26 ENCOUNTER — Ambulatory Visit: Payer: Medicare Other | Admitting: Radiation Oncology

## 2019-06-26 ENCOUNTER — Encounter: Payer: Self-pay | Admitting: Radiation Oncology

## 2019-06-26 DIAGNOSIS — D0512 Intraductal carcinoma in situ of left breast: Secondary | ICD-10-CM

## 2019-06-26 DIAGNOSIS — Z170 Estrogen receptor positive status [ER+]: Secondary | ICD-10-CM | POA: Diagnosis not present

## 2019-06-26 DIAGNOSIS — Z9889 Other specified postprocedural states: Secondary | ICD-10-CM | POA: Diagnosis not present

## 2019-06-29 ENCOUNTER — Encounter: Payer: Self-pay | Admitting: *Deleted

## 2019-06-30 ENCOUNTER — Ambulatory Visit
Admission: RE | Admit: 2019-06-30 | Discharge: 2019-06-30 | Disposition: A | Discharge status: Home / Self Care | Payer: Medicare Other | Source: Ambulatory Visit | Attending: Radiation Oncology | Admitting: Radiation Oncology

## 2019-06-30 ENCOUNTER — Other Ambulatory Visit: Payer: Self-pay

## 2019-06-30 DIAGNOSIS — D0512 Intraductal carcinoma in situ of left breast: Secondary | ICD-10-CM

## 2019-06-30 DIAGNOSIS — Z510 Encounter for antineoplastic radiation therapy: Secondary | ICD-10-CM | POA: Diagnosis not present

## 2019-06-30 DIAGNOSIS — C50512 Malignant neoplasm of lower-outer quadrant of left female breast: Secondary | ICD-10-CM | POA: Diagnosis not present

## 2019-06-30 NOTE — Progress Notes (Signed)
Radiation Oncology         [phone] ________________________________  Name: [name] MRN: [id number]  Date: 06/30/2019  DOB: [birth year]  SIMULATION AND TREATMENT PLANNING NOTE //  Special treatment procedure   Outpatient  DIAGNOSIS:     ICD-10-CM   1. Ductal carcinoma in situ (DCIS) of left breast  D05.12     NARRATIVE:  The patient was brought to the [place].  Identity was confirmed.  All relevant records and images related to the planned course of therapy were reviewed.  The patient freely provided informed written consent to proceed with treatment after reviewing the details related to the planned course of therapy. The consent form was witnessed and verified by the simulation staff.    Then, the patient was set-up in a stable reproducible supine position for radiation therapy with her ipsilateral arm over her head, and her upper body secured in a custom-made Vac-lok device.  CT images were obtained.  Surface markings were placed.  The CT images were loaded into the planning software.    Special treatment procedure:  Special treatment procedure was performed today due to the extra time and effort required by myself to plan and prepare this patient for deep inspiration breath hold technique.  I have determined cardiac sparing to be of benefit to this patient to prevent long term cardiac damage due to radiation of the heart.  Bellows were placed on the patient's abdomen. To facilitate cardiac sparing, the patient was coached by the radiation therapists on breath hold techniques and breathing practice was performed. Practice waveforms were obtained. The patient was then scanned while maintaining breath hold in the treatment position.  This image was then transferred over to the imaging specialist. The imaging specialist then created a fusion of the free breathing and breath hold scans using the chest wall as the stable structure. I personally reviewed the fusion in  axial, coronal and sagittal image planes.  Excellent cardiac sparing was obtained.  I felt the patient is an appropriate candidate for breath hold and the patient will be treated as such.  The image fusion was then reviewed with the patient to reinforce the necessity of reproducible breath hold.  TREATMENT PLANNING NOTE: Treatment planning then occurred.  The radiation prescription was entered and confirmed.     A total of 3 medically necessary complex treatment devices were fabricated and supervised by me: 2 fields with MLCs for custom blocks to protect heart, and lungs;  and, a Vac-lok. MORE COMPLEX DEVICES MAY BE MADE IN DOSIMETRY FOR FIELD IN FIELD BEAMS FOR DOSE HOMOGENEITY.  I have requested : 3D Simulation which is medically necessary to give adequate dose to at risk tissues while sparing lungs and heart.  I have requested a DVH of the following structures: lungs, heart, left lumpectomy cavity.    The patient will receive 40.05 Gy in 15 fractions to the left breast with 2 tangential fields.   This will be followed by a boost.  Optical Surface Tracking Plan:  Since intensity modulated radiotherapy (IMRT) and 3D conformal radiation treatment methods are predicated on accurate and precise positioning for treatment, intrafraction motion monitoring is medically necessary to ensure accurate and safe treatment delivery. The ability to quantify intrafraction motion without excessive ionizing radiation dose can only be performed with optical surface tracking. Accordingly, surface imaging offers the opportunity to obtain 3D measurements of patient position throughout IMRT and 3D treatments without excessive radiation exposure. I am ordering optical surface  tracking for this patient's upcoming course of radiotherapy.  ________________________________   Reference:  Particia Jasper, et al. Surface imaging-based analysis of intrafraction motion for breast radiotherapy patients.Journal of  [place], n. 6, nov. 2014. ISSN [id number].  Available at: <http://www.jacmp.org/index.php/jacmp/article/view/4957>.    -----------------------------------  [name], MD

## 2019-07-01 ENCOUNTER — Telehealth: Payer: Self-pay | Admitting: Hematology

## 2019-07-01 NOTE — Telephone Encounter (Signed)
Scheduled appt per 10/13 sch message - mailed reminder letter with appt date and time

## 2019-07-03 DIAGNOSIS — C50512 Malignant neoplasm of lower-outer quadrant of left female breast: Secondary | ICD-10-CM | POA: Diagnosis not present

## 2019-07-03 DIAGNOSIS — Z510 Encounter for antineoplastic radiation therapy: Secondary | ICD-10-CM | POA: Diagnosis not present

## 2019-07-03 DIAGNOSIS — D0512 Intraductal carcinoma in situ of left breast: Secondary | ICD-10-CM | POA: Diagnosis not present

## 2019-07-06 ENCOUNTER — Ambulatory Visit
Admission: RE | Admit: 2019-07-06 | Discharge: 2019-07-06 | Disposition: A | Discharge status: Home / Self Care | Payer: Medicare Other | Source: Ambulatory Visit | Attending: Radiation Oncology | Admitting: Radiation Oncology

## 2019-07-06 DIAGNOSIS — D0512 Intraductal carcinoma in situ of left breast: Secondary | ICD-10-CM | POA: Diagnosis not present

## 2019-07-06 DIAGNOSIS — C50512 Malignant neoplasm of lower-outer quadrant of left female breast: Secondary | ICD-10-CM | POA: Diagnosis not present

## 2019-07-06 DIAGNOSIS — Z510 Encounter for antineoplastic radiation therapy: Secondary | ICD-10-CM | POA: Diagnosis not present

## 2019-07-06 MED ORDER — RADIAPLEXRX EX GEL
Freq: Once | CUTANEOUS | Status: AC
  Administered 2019-07-06: 17:00:00 via TOPICAL

## 2019-07-06 MED ORDER — ALRA NON-METALLIC DEODORANT (RAD-ONC)
1.0000 "application " | Freq: Once | TOPICAL | Status: AC
  Administered 2019-07-06: 1 via TOPICAL

## 2019-07-06 NOTE — Progress Notes (Signed)

## 2019-07-07 ENCOUNTER — Ambulatory Visit
Admission: RE | Admit: 2019-07-07 | Discharge: 2019-07-07 | Disposition: A | Discharge status: Home / Self Care | Payer: Medicare Other | Source: Ambulatory Visit | Attending: Radiation Oncology | Admitting: Radiation Oncology

## 2019-07-07 ENCOUNTER — Other Ambulatory Visit: Payer: Self-pay

## 2019-07-07 DIAGNOSIS — C50512 Malignant neoplasm of lower-outer quadrant of left female breast: Secondary | ICD-10-CM | POA: Diagnosis not present

## 2019-07-07 DIAGNOSIS — D0512 Intraductal carcinoma in situ of left breast: Secondary | ICD-10-CM | POA: Diagnosis not present

## 2019-07-07 DIAGNOSIS — Z510 Encounter for antineoplastic radiation therapy: Secondary | ICD-10-CM | POA: Diagnosis not present

## 2019-07-08 ENCOUNTER — Ambulatory Visit
Admission: RE | Admit: 2019-07-08 | Discharge: 2019-07-08 | Disposition: A | Discharge status: Home / Self Care | Payer: Medicare Other | Source: Ambulatory Visit | Attending: Radiation Oncology | Admitting: Radiation Oncology

## 2019-07-08 ENCOUNTER — Other Ambulatory Visit: Payer: Self-pay

## 2019-07-08 DIAGNOSIS — D0512 Intraductal carcinoma in situ of left breast: Secondary | ICD-10-CM | POA: Diagnosis not present

## 2019-07-08 DIAGNOSIS — Z510 Encounter for antineoplastic radiation therapy: Secondary | ICD-10-CM | POA: Diagnosis not present

## 2019-07-08 DIAGNOSIS — C50512 Malignant neoplasm of lower-outer quadrant of left female breast: Secondary | ICD-10-CM | POA: Diagnosis not present

## 2019-07-09 ENCOUNTER — Ambulatory Visit
Admission: RE | Admit: 2019-07-09 | Discharge: 2019-07-09 | Disposition: A | Discharge status: Home / Self Care | Payer: Medicare Other | Source: Ambulatory Visit | Attending: Radiation Oncology | Admitting: Radiation Oncology

## 2019-07-09 ENCOUNTER — Other Ambulatory Visit: Payer: Self-pay

## 2019-07-09 DIAGNOSIS — D0512 Intraductal carcinoma in situ of left breast: Secondary | ICD-10-CM | POA: Diagnosis not present

## 2019-07-09 DIAGNOSIS — C50512 Malignant neoplasm of lower-outer quadrant of left female breast: Secondary | ICD-10-CM | POA: Diagnosis not present

## 2019-07-09 DIAGNOSIS — Z510 Encounter for antineoplastic radiation therapy: Secondary | ICD-10-CM | POA: Diagnosis not present

## 2019-07-10 ENCOUNTER — Other Ambulatory Visit: Payer: Self-pay

## 2019-07-10 ENCOUNTER — Ambulatory Visit
Admission: RE | Admit: 2019-07-10 | Discharge: 2019-07-10 | Disposition: A | Discharge status: Home / Self Care | Payer: Medicare Other | Source: Ambulatory Visit | Attending: Radiation Oncology | Admitting: Radiation Oncology

## 2019-07-10 DIAGNOSIS — C50512 Malignant neoplasm of lower-outer quadrant of left female breast: Secondary | ICD-10-CM | POA: Diagnosis not present

## 2019-07-10 DIAGNOSIS — D0512 Intraductal carcinoma in situ of left breast: Secondary | ICD-10-CM | POA: Diagnosis not present

## 2019-07-10 DIAGNOSIS — Z510 Encounter for antineoplastic radiation therapy: Secondary | ICD-10-CM | POA: Diagnosis not present

## 2019-07-13 ENCOUNTER — Ambulatory Visit
Admission: RE | Admit: 2019-07-13 | Discharge: 2019-07-13 | Disposition: A | Discharge status: Home / Self Care | Payer: Medicare Other | Source: Ambulatory Visit | Attending: Radiation Oncology | Admitting: Radiation Oncology

## 2019-07-13 ENCOUNTER — Other Ambulatory Visit: Payer: Self-pay

## 2019-07-13 DIAGNOSIS — D0512 Intraductal carcinoma in situ of left breast: Secondary | ICD-10-CM | POA: Diagnosis not present

## 2019-07-13 DIAGNOSIS — Z510 Encounter for antineoplastic radiation therapy: Secondary | ICD-10-CM | POA: Diagnosis not present

## 2019-07-13 DIAGNOSIS — C50512 Malignant neoplasm of lower-outer quadrant of left female breast: Secondary | ICD-10-CM | POA: Diagnosis not present

## 2019-07-14 ENCOUNTER — Ambulatory Visit
Admission: RE | Admit: 2019-07-14 | Discharge: 2019-07-14 | Disposition: A | Discharge status: Home / Self Care | Payer: Medicare Other | Source: Ambulatory Visit | Attending: Radiation Oncology | Admitting: Radiation Oncology

## 2019-07-14 ENCOUNTER — Other Ambulatory Visit: Payer: Self-pay

## 2019-07-14 DIAGNOSIS — C50512 Malignant neoplasm of lower-outer quadrant of left female breast: Secondary | ICD-10-CM | POA: Diagnosis not present

## 2019-07-14 DIAGNOSIS — D0512 Intraductal carcinoma in situ of left breast: Secondary | ICD-10-CM | POA: Diagnosis not present

## 2019-07-14 DIAGNOSIS — Z510 Encounter for antineoplastic radiation therapy: Secondary | ICD-10-CM | POA: Diagnosis not present

## 2019-07-15 ENCOUNTER — Other Ambulatory Visit: Payer: Self-pay

## 2019-07-15 ENCOUNTER — Ambulatory Visit
Admission: RE | Admit: 2019-07-15 | Discharge: 2019-07-15 | Disposition: A | Discharge status: Home / Self Care | Payer: Medicare Other | Source: Ambulatory Visit | Attending: Radiation Oncology | Admitting: Radiation Oncology

## 2019-07-15 DIAGNOSIS — D0512 Intraductal carcinoma in situ of left breast: Secondary | ICD-10-CM | POA: Diagnosis not present

## 2019-07-15 DIAGNOSIS — Z510 Encounter for antineoplastic radiation therapy: Secondary | ICD-10-CM | POA: Diagnosis not present

## 2019-07-15 DIAGNOSIS — C50512 Malignant neoplasm of lower-outer quadrant of left female breast: Secondary | ICD-10-CM | POA: Diagnosis not present

## 2019-07-16 ENCOUNTER — Ambulatory Visit
Admission: RE | Admit: 2019-07-16 | Discharge: 2019-07-16 | Disposition: A | Discharge status: Home / Self Care | Payer: Medicare Other | Source: Ambulatory Visit | Attending: Radiation Oncology | Admitting: Radiation Oncology

## 2019-07-16 DIAGNOSIS — C50512 Malignant neoplasm of lower-outer quadrant of left female breast: Secondary | ICD-10-CM | POA: Diagnosis not present

## 2019-07-16 DIAGNOSIS — D0512 Intraductal carcinoma in situ of left breast: Secondary | ICD-10-CM | POA: Diagnosis not present

## 2019-07-16 DIAGNOSIS — Z510 Encounter for antineoplastic radiation therapy: Secondary | ICD-10-CM | POA: Diagnosis not present

## 2019-07-17 ENCOUNTER — Other Ambulatory Visit: Payer: Self-pay

## 2019-07-17 ENCOUNTER — Ambulatory Visit
Admission: RE | Admit: 2019-07-17 | Discharge: 2019-07-17 | Disposition: A | Discharge status: Home / Self Care | Payer: Medicare Other | Source: Ambulatory Visit | Attending: Radiation Oncology | Admitting: Radiation Oncology

## 2019-07-17 DIAGNOSIS — Z510 Encounter for antineoplastic radiation therapy: Secondary | ICD-10-CM | POA: Diagnosis not present

## 2019-07-17 DIAGNOSIS — D0512 Intraductal carcinoma in situ of left breast: Secondary | ICD-10-CM | POA: Diagnosis not present

## 2019-07-17 DIAGNOSIS — C50512 Malignant neoplasm of lower-outer quadrant of left female breast: Secondary | ICD-10-CM | POA: Diagnosis not present

## 2019-07-20 ENCOUNTER — Ambulatory Visit
Admission: RE | Admit: 2019-07-20 | Discharge: 2019-07-20 | Disposition: A | Discharge status: Home / Self Care | Payer: Medicare Other | Source: Ambulatory Visit | Attending: Radiation Oncology | Admitting: Radiation Oncology

## 2019-07-20 ENCOUNTER — Encounter: Payer: Self-pay | Admitting: *Deleted

## 2019-07-20 ENCOUNTER — Ambulatory Visit: Admission: RE | Admit: 2019-07-20 | Payer: Medicare Other | Source: Ambulatory Visit | Admitting: Radiation Oncology

## 2019-07-20 ENCOUNTER — Other Ambulatory Visit: Payer: Self-pay

## 2019-07-20 DIAGNOSIS — D0512 Intraductal carcinoma in situ of left breast: Secondary | ICD-10-CM | POA: Diagnosis not present

## 2019-07-20 DIAGNOSIS — Z510 Encounter for antineoplastic radiation therapy: Secondary | ICD-10-CM | POA: Diagnosis not present

## 2019-07-20 DIAGNOSIS — C50512 Malignant neoplasm of lower-outer quadrant of left female breast: Secondary | ICD-10-CM | POA: Diagnosis not present

## 2019-07-21 ENCOUNTER — Ambulatory Visit
Admission: RE | Admit: 2019-07-21 | Discharge: 2019-07-21 | Disposition: A | Discharge status: Home / Self Care | Payer: Medicare Other | Source: Ambulatory Visit | Attending: Radiation Oncology | Admitting: Radiation Oncology

## 2019-07-21 ENCOUNTER — Ambulatory Visit: Payer: Medicare Other | Admitting: Radiation Oncology

## 2019-07-21 ENCOUNTER — Other Ambulatory Visit: Payer: Self-pay

## 2019-07-21 DIAGNOSIS — Z510 Encounter for antineoplastic radiation therapy: Secondary | ICD-10-CM | POA: Diagnosis not present

## 2019-07-21 DIAGNOSIS — D0512 Intraductal carcinoma in situ of left breast: Secondary | ICD-10-CM | POA: Diagnosis not present

## 2019-07-21 DIAGNOSIS — C50512 Malignant neoplasm of lower-outer quadrant of left female breast: Secondary | ICD-10-CM | POA: Diagnosis not present

## 2019-07-22 ENCOUNTER — Ambulatory Visit
Admission: RE | Admit: 2019-07-22 | Discharge: 2019-07-22 | Disposition: A | Discharge status: Home / Self Care | Payer: Medicare Other | Source: Ambulatory Visit | Attending: Radiation Oncology | Admitting: Radiation Oncology

## 2019-07-22 DIAGNOSIS — D0512 Intraductal carcinoma in situ of left breast: Secondary | ICD-10-CM | POA: Diagnosis not present

## 2019-07-22 DIAGNOSIS — C50512 Malignant neoplasm of lower-outer quadrant of left female breast: Secondary | ICD-10-CM | POA: Diagnosis not present

## 2019-07-22 DIAGNOSIS — Z510 Encounter for antineoplastic radiation therapy: Secondary | ICD-10-CM | POA: Diagnosis not present

## 2019-07-23 ENCOUNTER — Other Ambulatory Visit: Payer: Self-pay

## 2019-07-23 ENCOUNTER — Ambulatory Visit
Admission: RE | Admit: 2019-07-23 | Discharge: 2019-07-23 | Disposition: A | Discharge status: Home / Self Care | Payer: Medicare Other | Source: Ambulatory Visit | Attending: Radiation Oncology | Admitting: Radiation Oncology

## 2019-07-23 DIAGNOSIS — Z510 Encounter for antineoplastic radiation therapy: Secondary | ICD-10-CM | POA: Diagnosis not present

## 2019-07-23 DIAGNOSIS — C50512 Malignant neoplasm of lower-outer quadrant of left female breast: Secondary | ICD-10-CM | POA: Diagnosis not present

## 2019-07-23 DIAGNOSIS — D0512 Intraductal carcinoma in situ of left breast: Secondary | ICD-10-CM | POA: Diagnosis not present

## 2019-07-24 ENCOUNTER — Other Ambulatory Visit: Payer: Self-pay

## 2019-07-24 ENCOUNTER — Ambulatory Visit
Admission: RE | Admit: 2019-07-24 | Discharge: 2019-07-24 | Disposition: A | Discharge status: Home / Self Care | Payer: Medicare Other | Source: Ambulatory Visit | Attending: Radiation Oncology | Admitting: Radiation Oncology

## 2019-07-24 DIAGNOSIS — C50512 Malignant neoplasm of lower-outer quadrant of left female breast: Secondary | ICD-10-CM | POA: Diagnosis not present

## 2019-07-24 DIAGNOSIS — D0512 Intraductal carcinoma in situ of left breast: Secondary | ICD-10-CM | POA: Diagnosis not present

## 2019-07-24 DIAGNOSIS — Z510 Encounter for antineoplastic radiation therapy: Secondary | ICD-10-CM | POA: Diagnosis not present

## 2019-07-24 NOTE — Progress Notes (Signed)
[place]   Telephone:(336) [phone] Fax:(336) [phone]   Clinic Follow up Note   Patient Care Team: [name], MD as PCP - General (Family Medicine) [name] as Physician Assistant (Physician Assistant) [name], RN as Oncology Nurse Navigator [name], RN as Oncology Nurse Navigator [name], MD as Consulting Physician (General Surgery) Andrea Merle, MD as Consulting Physician (Hematology) [name], MD as Attending Physician (Radiation Oncology)  Date of Service:  07/27/2019  CHIEF COMPLAINT: F/u of Left breast DCIS  SUMMARY OF ONCOLOGIC HISTORY: Oncology History  Ductal carcinoma in situ (DCIS) of left breast  04/27/2019 Initial Biopsy   Diagnosis 04/27/19 Breast, left, needle core biopsy, lower outer calcification, 10cmfn - DUCTAL CARCINOMA IN SITU WITH CALCIFICATIONS. - SEE COMMENT.  Results: IMMUNOHISTOCHEMICAL AND MORPHOMETRIC ANALYSIS PERFORMED MANUALLY Estrogen Receptor: 100%, POSITIVE, STRONG STAINING INTENSITY Progesterone Receptor: 80%, POSITIVE, STRONG STAINING INTENSITY   04/30/2019 Initial Diagnosis   Ductal carcinoma in situ (DCIS) of left breast   05/06/2019 Cancer Staging   Staging form: Breast, AJCC 8th Edition - Clinical: Stage 0 (cTis (DCIS), cN0, cM0, ER+, PR+, HER2: Not Assessed) - Signed by Andrea Merle, MD on 05/06/2019   06/04/2019 Surgery   LEFT BREAST LUMPECTOMY WITH RADIOACTIVE SEED LOCALIZATION by Dr Marlou Starks  06/04/19    06/04/2019 Pathology Results   DIAGNOSIS: 06/04/19  A. BREAST, LEFT, LUMPECTOMY:  -  Ductal carcinoma in situ, intermediate grade, 0.3 cm  -  Margins uninvolved by carcinoma (0.2 cm; medial margin)  -  Previous biopsy site changes  -  See oncology table below   B. BREAST, LEFT SUPERIOR MARGIN, EXCISION:  -  Focal residual ductal carcinoma in situ  -  Margins uninvolved by carcinoma (0.3 cm)   C. BREAST, LEFT MEDIAL MARGIN, EXCISION:  -  No residual carcinoma identified   D. BREAST, LEFT DEEP MARGIN, EXCISION:  -  No residual carcinoma identified     07/06/2019 - 07/31/2019 Radiation Therapy   Adjuvant Radiation with Dr Isidore Moos 07/06/19-07/31/19      CURRENT THERAPY:  Adjuvant Radiation with Dr Isidore Moos 07/06/19-07/31/19  INTERVAL HISTORY:  [name] is here for a follow up. She presents to the clinic alone. She notes she is tolerating RT well with minimal skin erythema. She plans to complete this week.  She is otherwise doing very well, denies significant fatigue, no pain, or other complaints.   REVIEW OF SYSTEMS:   Constitutional: Denies fevers, chills or abnormal weight loss Eyes: Denies blurriness of vision Ears, nose, mouth, throat, and face: Denies mucositis or sore throat Respiratory: Denies cough, dyspnea or wheezes Cardiovascular: Denies palpitation, chest discomfort or lower extremity swelling Gastrointestinal:  Denies nausea, heartburn or change in bowel habits Skin: Denies abnormal skin rashes Lymphatics: Denies new lymphadenopathy or easy bruising Neurological:Denies numbness, tingling or new weaknesses Behavioral/Psych: Mood is stable, no new changes  All other systems were reviewed with the patient and are negative.  MEDICAL HISTORY:  Past Medical History:  Diagnosis Date  . Anxiety   . Arthritis    knees   . Ductal carcinoma in situ of breast   . GERD (gastroesophageal reflux disease)   . Heart murmur    hx of in past  . Hyperlipidemia   . Hypertension   . Microscopic hematuria   . Obesity   . Thyroid disease    pt not aware of issues with her thyroid    SURGICAL HISTORY: Past Surgical History:  Procedure Laterality  Date  . BREAST LUMPECTOMY WITH RADIOACTIVE SEED LOCALIZATION Left 06/04/2019   Procedure: LEFT BREAST LUMPECTOMY WITH RADIOACTIVE SEED LOCALIZATION;  Surgeon: [name], MD;  Location: [place];  Service: General;  Laterality: Left;  . calcium removed from left heel    .  COLONOSCOPY    . KNEE ARTHROSCOPY Left 04/22/2013   Procedure: LEFT KNEE ARTHROSCOPY WITH medial meniscusectomy and chondroplasty;  Surgeon: Gearlean Alf, MD;  Location: [facility];  Service: Orthopedics;  Laterality: Left;  . TONSILLECTOMY    . TOTAL KNEE ARTHROPLASTY Right 02/16/2019   Procedure: TOTAL KNEE ARTHROPLASTY;  Surgeon: Gaynelle Arabian, MD;  Location: [facility];  Service: Orthopedics;  Laterality: Right;  7mn  . TUBAL LIGATION      I have reviewed the social history and family history with the patient and they are unchanged from previous note.  ALLERGIES:  has No Known Allergies.  MEDICATIONS:  Current Outpatient Medications  Medication Sig Dispense Refill  . aspirin EC 81 MG tablet Take 81 mg by mouth daily.    . calcium carbonate (OS-CAL) 600 MG TABS tablet Take 1 tablet (600 mg total) by mouth daily. 60 tablet 1  . cholecalciferol (VITAMIN D) 1000 units tablet Take 1 tablet (1,000 Units total) by mouth daily. 30 tablet 1  . hydrochlorothiazide (HYDRODIURIL) 25 MG tablet TAKE 1 TABLET(25 MG) BY MOUTH DAILY 90 tablet 3  . Multiple Vitamin (MULTIVITAMIN) tablet Take 1 tablet by mouth daily. 30 tablet 1  . omeprazole (PRILOSEC OTC) 20 MG tablet Take 20 mg by mouth at bedtime.     . simvastatin (ZOCOR) 10 MG tablet TAKE 1 TABLET BY MOUTH EVERY DAY AT 6PM 90 tablet 1  . Ascorbic Acid (VITAMIN C) 1000 MG tablet Take 1,000 mg by mouth daily.    .Marland KitchenHYDROcodone-acetaminophen (NORCO/VICODIN) 5-325 MG tablet Take 1-2 tablets by mouth every 6 (six) hours as needed for moderate pain or severe pain. (Patient not taking: Reported on 06/26/2019) 15 tablet 0   No current facility-administered medications for this visit.     PHYSICAL EXAMINATION: ECOG PERFORMANCE STATUS: 0 - Asymptomatic  Vitals:   07/27/19 1403  BP: (!) 144/93  Pulse: 86  Resp: 17  Temp: 98.9 F (37.2 C)  SpO2: 96%   Filed Weights   07/27/19 1403  Weight: 211 lb 1.6 oz (95.8 kg)    GENERAL:alert, no distress and  comfortable SKIN: skin color, texture, turgor are normal, no rashes or significant lesions EYES: normal, Conjunctiva are pink and non-injected, sclera clear  NECK: supple, thyroid normal size, non-tender, without nodularity LYMPH:  no palpable lymphadenopathy in the cervical, axillary  LUNGS: clear to auscultation and percussion with normal breathing effort HEART: regular rate & rhythm and no murmurs and no lower extremity edema ABDOMEN:abdomen soft, non-tender and normal bowel sounds Musculoskeletal:no cyanosis of digits and no clubbing  NEURO: alert & oriented x 3 with fluent speech, no focal motor/sensory deficits BREAST: S/p left breast lumpectomy: surgical incision healed well (+) Skin erythema of left breast from RT. No palpable mass, nodules or adenopathy bilaterally. Breast exam benign.   LABORATORY DATA:  I have reviewed the data as listed CBC Latest Ref Rng & Units 05/06/2019 02/18/2019 02/17/2019  WBC 4.0 - 10.5 K/uL 7.2 11.2(H) 11.9(H)  Hemoglobin 12.0 - 15.0 g/dL 14.0 11.6(L) 12.0  Hematocrit 36.0 - 46.0 % 43.2 36.5 38.2  Platelets 150 - 400 K/uL 253 217 234     CMP Latest Ref Rng & Units 05/06/2019  02/18/2019 02/17/2019  Glucose 70 - 99 mg/dL 97 119(H) 131(H)  BUN 8 - 23 mg/dL 16 15 12   Creatinine 0.44 - 1.00 mg/dL 0.78 0.63 0.56  Sodium 135 - 145 mmol/L 141 140 138  Potassium 3.5 - 5.1 mmol/L 3.6 3.1(L) 3.7  Chloride 98 - 111 mmol/L 106 106 105  CO2 22 - 32 mmol/L 25 26 26   Calcium 8.9 - 10.3 mg/dL 9.4 8.6(L) 8.4(L)  Total Protein 6.5 - 8.1 g/dL 6.9 - -  Total Bilirubin 0.3 - 1.2 mg/dL 0.4 - -  Alkaline Phos 38 - 126 U/L 60 - -  AST 15 - 41 U/L 15 - -  ALT 0 - 44 U/L 15 - -      RADIOGRAPHIC STUDIES: I have personally reviewed the radiological images as listed and agreed with the findings in the report. No results found.   ASSESSMENT & PLAN:  INELLA KUWAHARA is a 68 y.o. female with   1. Ductal carcinoma in situ (DCIS) of left breast, Stage 0, ER/PR +, Grade 2 -She  was diagnosed in 05/2019. She is s/p left lumpectomy with good pathology report.  -She started adjuvant radiation with Dr. Isidore Moos on 07/06/19. She is tolerating very well with minimal skin change. She plans to complete on 07/31/19.  -I reviewed her risk of future breast cancer with Primrose tool, she has 5% risk of future breast cancer s/p surgery and radiation. If she were to proceed with antiestrogen therapy her risk of recurrence would be 2%. There is adequate benefit of endocrine therapy.  -I recommend antiestrogen therapy with Anastrozole or Tamoxifen for 5 years.   -The potential side effects from Tamoxifen, which includes but not limited to, hot flash, skin and vaginal dryness, slightly increased risk of cardiovascular disease and cataract, small risk of thrombosis and endometrial cancer, were discussed with her in great details.   -The potential benefit and side effects from anastrozole, which includes but not limited to, hot flash, skin and vaginal dryness, metabolic changes ( increased blood glucose, cholesterol, weight, etc.), slightly in increased risk of cardiovascular disease, cataracts, muscular and joint discomfort, osteopenia and osteoporosis, etc, were discussed with her in great details. -She will think about it, and we may choose which medication based on DEXA scan results. I provided her with reading material on both medications. Given her low risk, it is understandable if she does not want to proceed.  -She is clinically doing well and stable. Physical exam shows surgical and radiation changes, no other concerns -F/u open    2. Arthritis, Bone health  -She notes she had a normal DEXA scan in the last 2007 -I recommend she have a new baseline DEXA, she is agreeable.   -Her arthritis is currently manageable. Will monitor on antiestrogen therapy.    3. HTN -On HCTZ and Zocor    PLAN:  -Continue with RT, plan to complete this week  -DEXA at  [facility] in 1-2 months, I will call her after DEXA scan and she will decide if she wants to try antiestrogen therapy  -f/u open   No problem-specific Assessment & Plan notes found for this encounter.   Orders Placed This Encounter  Procedures  . DG Bone Density    Standing Status:   Future    Standing Expiration Date:   07/26/2020    Scheduling Instructions:     [facility]    Order Specific Question:   Reason for Exam (SYMPTOM  OR DIAGNOSIS REQUIRED)  Answer:   SCREENING    Order Specific Question:   Preferred imaging location?    Answer:   External   All questions were answered. The patient knows to call the clinic with any problems, questions or concerns. No barriers to learning was detected. I spent 20 minutes counseling the patient face to face. The total time spent in the appointment was 25 minutes and more than 50% was on counseling and review of test results     Andrea Merle, MD 07/27/2019   I, [name], am acting as scribe for Andrea Merle, MD.   I have reviewed the above documentation for accuracy and completeness, and I agree with the above.

## 2019-07-27 ENCOUNTER — Other Ambulatory Visit: Payer: Self-pay

## 2019-07-27 ENCOUNTER — Ambulatory Visit
Admission: RE | Admit: 2019-07-27 | Discharge: 2019-07-27 | Disposition: A | Discharge status: Home / Self Care | Payer: Medicare Other | Source: Ambulatory Visit | Attending: Radiation Oncology | Admitting: Radiation Oncology

## 2019-07-27 ENCOUNTER — Inpatient Hospital Stay: Payer: Medicare Other | Attending: Hematology | Admitting: Hematology

## 2019-07-27 VITALS — BP 144/93 | HR 86 | Temp 98.9°F | Resp 17 | Ht 68.0 in | Wt 211.1 lb

## 2019-07-27 DIAGNOSIS — Z510 Encounter for antineoplastic radiation therapy: Secondary | ICD-10-CM | POA: Diagnosis not present

## 2019-07-27 DIAGNOSIS — D0512 Intraductal carcinoma in situ of left breast: Secondary | ICD-10-CM

## 2019-07-27 DIAGNOSIS — C50512 Malignant neoplasm of lower-outer quadrant of left female breast: Secondary | ICD-10-CM | POA: Diagnosis not present

## 2019-07-27 DIAGNOSIS — I10 Essential (primary) hypertension: Secondary | ICD-10-CM | POA: Insufficient documentation

## 2019-07-27 DIAGNOSIS — E2839 Other primary ovarian failure: Secondary | ICD-10-CM | POA: Diagnosis not present

## 2019-07-27 DIAGNOSIS — M1990 Unspecified osteoarthritis, unspecified site: Secondary | ICD-10-CM | POA: Diagnosis not present

## 2019-07-27 DIAGNOSIS — Z170 Estrogen receptor positive status [ER+]: Secondary | ICD-10-CM | POA: Diagnosis not present

## 2019-07-28 ENCOUNTER — Ambulatory Visit
Admission: RE | Admit: 2019-07-28 | Discharge: 2019-07-28 | Disposition: A | Discharge status: Home / Self Care | Payer: Medicare Other | Source: Ambulatory Visit | Attending: Radiation Oncology | Admitting: Radiation Oncology

## 2019-07-28 ENCOUNTER — Encounter: Payer: Self-pay | Admitting: Hematology

## 2019-07-28 ENCOUNTER — Telehealth: Payer: Self-pay | Admitting: Hematology

## 2019-07-28 DIAGNOSIS — C50512 Malignant neoplasm of lower-outer quadrant of left female breast: Secondary | ICD-10-CM | POA: Diagnosis not present

## 2019-07-28 DIAGNOSIS — D0512 Intraductal carcinoma in situ of left breast: Secondary | ICD-10-CM | POA: Diagnosis not present

## 2019-07-28 DIAGNOSIS — Z510 Encounter for antineoplastic radiation therapy: Secondary | ICD-10-CM | POA: Diagnosis not present

## 2019-07-28 NOTE — Telephone Encounter (Signed)
Per 11/9 los Mammogram at [facility] in a month F/u open for now .  Sent a secure chat to get the patient info faxed over to [facility]

## 2019-07-29 ENCOUNTER — Other Ambulatory Visit: Payer: Self-pay

## 2019-07-29 ENCOUNTER — Ambulatory Visit
Admission: RE | Admit: 2019-07-29 | Discharge: 2019-07-29 | Disposition: A | Discharge status: Home / Self Care | Payer: Medicare Other | Source: Ambulatory Visit | Attending: Radiation Oncology | Admitting: Radiation Oncology

## 2019-07-29 DIAGNOSIS — Z510 Encounter for antineoplastic radiation therapy: Secondary | ICD-10-CM | POA: Diagnosis not present

## 2019-07-29 DIAGNOSIS — D0512 Intraductal carcinoma in situ of left breast: Secondary | ICD-10-CM | POA: Diagnosis not present

## 2019-07-30 ENCOUNTER — Other Ambulatory Visit: Payer: Self-pay

## 2019-07-30 ENCOUNTER — Ambulatory Visit
Admission: RE | Admit: 2019-07-30 | Discharge: 2019-07-30 | Disposition: A | Discharge status: Home / Self Care | Payer: Medicare Other | Source: Ambulatory Visit | Attending: Radiation Oncology | Admitting: Radiation Oncology

## 2019-07-30 DIAGNOSIS — C50512 Malignant neoplasm of lower-outer quadrant of left female breast: Secondary | ICD-10-CM | POA: Diagnosis not present

## 2019-07-30 DIAGNOSIS — Z510 Encounter for antineoplastic radiation therapy: Secondary | ICD-10-CM | POA: Diagnosis not present

## 2019-07-30 DIAGNOSIS — D0512 Intraductal carcinoma in situ of left breast: Secondary | ICD-10-CM | POA: Diagnosis not present

## 2019-07-31 ENCOUNTER — Telehealth: Payer: Self-pay | Admitting: Hematology

## 2019-07-31 ENCOUNTER — Other Ambulatory Visit: Payer: Self-pay

## 2019-07-31 ENCOUNTER — Encounter: Payer: Self-pay | Admitting: *Deleted

## 2019-07-31 ENCOUNTER — Ambulatory Visit
Admission: RE | Admit: 2019-07-31 | Discharge: 2019-07-31 | Disposition: A | Discharge status: Home / Self Care | Payer: Medicare Other | Source: Ambulatory Visit | Attending: Radiation Oncology | Admitting: Radiation Oncology

## 2019-07-31 ENCOUNTER — Encounter: Payer: Self-pay | Admitting: Radiation Oncology

## 2019-07-31 DIAGNOSIS — C50512 Malignant neoplasm of lower-outer quadrant of left female breast: Secondary | ICD-10-CM | POA: Diagnosis not present

## 2019-07-31 DIAGNOSIS — Z510 Encounter for antineoplastic radiation therapy: Secondary | ICD-10-CM | POA: Diagnosis not present

## 2019-07-31 DIAGNOSIS — D0512 Intraductal carcinoma in situ of left breast: Secondary | ICD-10-CM | POA: Diagnosis not present

## 2019-07-31 NOTE — Telephone Encounter (Signed)
Scheduled appt per 11/13 sch message - mailed reminder letter with appt date and time

## 2019-08-03 ENCOUNTER — Other Ambulatory Visit: Payer: Self-pay | Admitting: Family Medicine

## 2019-08-04 ENCOUNTER — Other Ambulatory Visit: Payer: Self-pay

## 2019-08-04 ENCOUNTER — Ambulatory Visit (INDEPENDENT_AMBULATORY_CARE_PROVIDER_SITE_OTHER): Payer: Medicare Other

## 2019-08-04 DIAGNOSIS — Z23 Encounter for immunization: Secondary | ICD-10-CM

## 2019-08-10 ENCOUNTER — Telehealth: Payer: Self-pay

## 2019-08-10 NOTE — Telephone Encounter (Signed)
Faxed signed order to [facility] for bone density scan, received confirmation fax went through, sent to HIM for scan to chart.

## 2019-08-11 DIAGNOSIS — Z780 Asymptomatic menopausal state: Secondary | ICD-10-CM | POA: Diagnosis not present

## 2019-08-11 DIAGNOSIS — Z853 Personal history of malignant neoplasm of breast: Secondary | ICD-10-CM | POA: Diagnosis not present

## 2019-08-11 DIAGNOSIS — E2839 Other primary ovarian failure: Secondary | ICD-10-CM | POA: Diagnosis not present

## 2019-08-11 DIAGNOSIS — K219 Gastro-esophageal reflux disease without esophagitis: Secondary | ICD-10-CM | POA: Diagnosis not present

## 2019-08-27 ENCOUNTER — Other Ambulatory Visit: Payer: Self-pay | Admitting: Hematology

## 2019-08-27 ENCOUNTER — Other Ambulatory Visit: Payer: Self-pay

## 2019-08-27 DIAGNOSIS — D0512 Intraductal carcinoma in situ of left breast: Secondary | ICD-10-CM

## 2019-08-27 MED ORDER — ANASTROZOLE 1 MG PO TABS: 1.0000 mg | ORAL_TABLET | Freq: Every day | ORAL | 1 refills | Status: DC

## 2019-08-27 NOTE — Progress Notes (Signed)
Spoke with patient regarding bone density scan results.  Per Dr. Burr Medico was normal and okay to start anastrozole.  Patient verbalized an understanding.

## 2019-09-01 NOTE — Progress Notes (Signed)
I called the patient today about her upcoming follow-up appointment in radiation oncology.   Given the state of the COVID-19 pandemic, concerning case numbers in our community, and guidance from [facility], I offered a phone assessment with the patient to determine if coming to the clinic was necessary. She accepted.  I let the patient know that I had spoken with Dr. Isidore Moos, and she wanted them to know the importance of washing their hands for at least 20 seconds at a time, especially after going out in public, and before they eat.  Limit going out in public whenever possible. Do not touch your face, unless your hands are clean, such as when bathing. Get plenty of rest, eat well, and stay hydrated.   The patient denies any symptomatic concerns.  Specifically, they report good healing of their skin in the radiation fields.  Skin is intact.    I recommended that she continue skin care by applying oil or lotion with vitamin E to the skin in the radiation fields, BID, for 2 more months.  Continue follow-up with medical oncology - follow-up is scheduled on 11/02/19 with Wilber Bihari NP.  I explained that yearly mammograms are important for patients with intact breast tissue, and physical exams are important after mastectomy for patients that cannot undergo mammography.  I encouraged her to call if she had further questions or concerns about her healing. Otherwise, she will follow-up PRN in radiation oncology. Patient is pleased with this plan, and we will cancel her upcoming follow-up to reduce the risk of COVID-19 transmission.

## 2019-09-02 ENCOUNTER — Ambulatory Visit
Admission: RE | Admit: 2019-09-02 | Discharge: 2019-09-02 | Disposition: A | Discharge status: Home / Self Care | Payer: Medicare Other | Source: Ambulatory Visit | Attending: Radiation Oncology | Admitting: Radiation Oncology

## 2019-09-24 DIAGNOSIS — H17822 Peripheral opacity of cornea, left eye: Secondary | ICD-10-CM | POA: Diagnosis not present

## 2019-09-25 NOTE — Progress Notes (Signed)
  Patient Name: [name] MRN: [id number] DOB: [birth year] Referring Physician: [name] (Profile Not Attached) Date of Service: 07/31/2019 [facility] [place]                                                        End Of Treatment Note  Diagnoses:   D05.12-Intraductal carcinoma in situ of left breast  Cancer Staging: Stage 0 DCIS, left  Intent: Curative  Radiation Treatment Dates: 07/06/2019 through 07/31/2019 Site Technique Total Dose (Gy) Dose per Fx (Gy) Completed Fx Beam Energies  Breast: Breast_Lt 3D 40.05/40.05 2.67 15/15 10X  Breast: Breast_Lt_Bst 3D 10/10 2 5/5 6X, 10X   Narrative: The patient tolerated radiation therapy relatively well.   Plan: The patient will follow-up with radiation oncology in 51mo, or as needed.  -----------------------------------  [name], MD

## 2019-11-02 ENCOUNTER — Other Ambulatory Visit: Payer: Self-pay

## 2019-11-02 ENCOUNTER — Encounter: Payer: Self-pay | Admitting: Nurse Practitioner

## 2019-11-02 ENCOUNTER — Inpatient Hospital Stay: Payer: Medicare Other | Attending: Hematology | Admitting: Nurse Practitioner

## 2019-11-02 VITALS — BP 142/80 | HR 62 | Temp 98.7°F | Resp 17 | Ht 68.0 in | Wt 209.2 lb

## 2019-11-02 DIAGNOSIS — G4700 Insomnia, unspecified: Secondary | ICD-10-CM | POA: Diagnosis not present

## 2019-11-02 DIAGNOSIS — Z1211 Encounter for screening for malignant neoplasm of colon: Secondary | ICD-10-CM | POA: Diagnosis not present

## 2019-11-02 DIAGNOSIS — R519 Headache, unspecified: Secondary | ICD-10-CM | POA: Diagnosis not present

## 2019-11-02 DIAGNOSIS — D0512 Intraductal carcinoma in situ of left breast: Secondary | ICD-10-CM | POA: Diagnosis not present

## 2019-11-02 DIAGNOSIS — N951 Menopausal and female climacteric states: Secondary | ICD-10-CM | POA: Diagnosis not present

## 2019-11-02 NOTE — Progress Notes (Signed)
CLINIC:  Survivorship   REASON FOR VISIT:  Routine follow-up post-treatment for a recent history of breast cancer.  BRIEF ONCOLOGIC HISTORY:  Oncology History  Ductal carcinoma in situ (DCIS) of left breast  04/27/2019 Initial Biopsy   Diagnosis 04/27/19 Breast, left, needle core biopsy, lower outer calcification, 10cmfn - DUCTAL CARCINOMA IN SITU WITH CALCIFICATIONS. - SEE COMMENT.  Results: IMMUNOHISTOCHEMICAL AND MORPHOMETRIC ANALYSIS PERFORMED MANUALLY Estrogen Receptor: 100%, POSITIVE, STRONG STAINING INTENSITY Progesterone Receptor: 80%, POSITIVE, STRONG STAINING INTENSITY   04/30/2019 Initial Diagnosis   Ductal carcinoma in situ (DCIS) of left breast   05/06/2019 Cancer Staging   Staging form: Breast, AJCC 8th Edition - Clinical: Stage 0 (cTis (DCIS), cN0, cM0, ER+, PR+, HER2: Not Assessed) - Signed by [name], MD on 05/06/2019   06/04/2019 Surgery   LEFT BREAST LUMPECTOMY WITH RADIOACTIVE SEED LOCALIZATION by Dr Marlou Starks  06/04/19    06/04/2019 Pathology Results   DIAGNOSIS: 06/04/19  A. BREAST, LEFT, LUMPECTOMY:  -  Ductal carcinoma in situ, intermediate grade, 0.3 cm  -  Margins uninvolved by carcinoma (0.2 cm; medial margin)  -  Previous biopsy site changes  -  See oncology table below   B. BREAST, LEFT SUPERIOR MARGIN, EXCISION:  -  Focal residual ductal carcinoma in situ  -  Margins uninvolved by carcinoma (0.3 cm)   C. BREAST, LEFT MEDIAL MARGIN, EXCISION:  -  No residual carcinoma identified   D. BREAST, LEFT DEEP MARGIN, EXCISION:  -  No residual carcinoma identified     07/06/2019 - 07/31/2019 Radiation Therapy   Adjuvant Radiation with Dr Isidore Moos 07/06/19-07/31/19   08/2019 -  Anti-estrogen oral therapy   Began anastrozole   11/02/2019 Survivorship   SCP delivered by Cira Rue, NP      INTERVAL HISTORY:  [name] presents to the [facility] today for our initial meeting to review her survivorship care plan detailing her treatment  course for breast cancer, as well as monitoring long-term side effects of that treatment, education regarding health maintenance, screening, and overall wellness and health promotion.     Overall, [name] is doing well in general. She started anastrozole in 08/2019. She has moderate hot flashes occurring daily but tolerable. She has mild intermittent headaches mostly in the morning. She has insomia which she had at baseline, she and her husband are "night owls" often not going to bed until 2 am. Over the last week she has not been getting to bed until 4-6 am. She sleeps late in the day to recover. She used tylenol PM which helped initially but not anymore. Overall this does not really bother here, not interfering with daily life or QOL. She denies other changes in her breasts. Skin has healed from RT. She has intermittent pains but nothing persistent.   Otherwise, denies recent infection, change in bowel habits, GI or vaginal bleeding, chest pain, dyspnea, change from baseline cough, bone/joint pain, mood disturbance.     ONCOLOGY TREATMENT TEAM:  1. Surgeon:  Dr. Marlou Starks at [facility] 2. Medical Oncologist: Dr. Burr Medico  3. Radiation Oncologist: Dr. Isidore Moos    PAST MEDICAL/SURGICAL HISTORY:  Past Medical History:  Diagnosis Date   Anxiety    Arthritis    knees    Ductal carcinoma in situ of breast    GERD (gastroesophageal reflux disease)    Heart murmur    hx of in past   Hyperlipidemia    Hypertension    Microscopic hematuria    Obesity  Thyroid disease    pt not aware of issues with her thyroid   Past Surgical History:  Procedure Laterality Date   BREAST LUMPECTOMY WITH RADIOACTIVE SEED LOCALIZATION Left 06/04/2019   Procedure: LEFT BREAST LUMPECTOMY WITH RADIOACTIVE SEED LOCALIZATION;  Surgeon: [name], MD;  Location: [place];  Service: General;  Laterality: Left;   calcium removed from left heel     COLONOSCOPY     KNEE  ARTHROSCOPY Left 04/22/2013   Procedure: LEFT KNEE ARTHROSCOPY WITH medial meniscusectomy and chondroplasty;  Surgeon: Gearlean Alf, MD;  Location: [facility];  Service: Orthopedics;  Laterality: Left;   TONSILLECTOMY     TOTAL KNEE ARTHROPLASTY Right 02/16/2019   Procedure: TOTAL KNEE ARTHROPLASTY;  Surgeon: Gaynelle Arabian, MD;  Location: [facility];  Service: Orthopedics;  Laterality: Right;  40mn   TUBAL LIGATION       ALLERGIES:  No Known Allergies   CURRENT MEDICATIONS:  Outpatient Encounter Medications as of 11/02/2019  Medication Sig Note   anastrozole (ARIMIDEX) 1 MG tablet TAKE 1 TABLET(1 MG) BY MOUTH DAILY    Ascorbic Acid (VITAMIN C) 1000 MG tablet Take 1,000 mg by mouth daily. 07/27/2019: Not taking during RT   aspirin EC 81 MG tablet Take 81 mg by mouth daily.    calcium carbonate (OS-CAL) 600 MG TABS tablet Take 1 tablet (600 mg total) by mouth daily.    cholecalciferol (VITAMIN D) 1000 units tablet Take 1 tablet (1,000 Units total) by mouth daily.    esomeprazole (NEXIUM) 20 MG capsule Take 20 mg by mouth daily at 12 noon. Patient takes as needed    hydrochlorothiazide (HYDRODIURIL) 25 MG tablet TAKE 1 TABLET(25 MG) BY MOUTH DAILY    ibuprofen (IBUPROFEN 100 JUNIOR STRENGTH) 100 MG chewable tablet Chew by mouth every 8 (eight) hours as needed.    Multiple Vitamin (MULTIVITAMIN) tablet Take 1 tablet by mouth daily.    simvastatin (ZOCOR) 10 MG tablet TAKE 1 TABLET BY MOUTH EVERY DAY AT 6PM    HYDROcodone-acetaminophen (NORCO/VICODIN) 5-325 MG tablet Take 1-2 tablets by mouth every 6 (six) hours as needed for moderate pain or severe pain. (Patient not taking: Reported on 06/26/2019) 07/27/2019: Does not help pain, only makes sleepy   omeprazole (PRILOSEC OTC) 20 MG tablet Take 20 mg by mouth at bedtime.     No facility-administered encounter medications on file as of 11/02/2019.     ONCOLOGIC FAMILY HISTORY:  Family History  Problem Relation Age of Onset   Heart  disease Mother 641      MI at 619and 855  Cancer Father 755      Prostate Cancer   Colon polyps Father    Stomach cancer Father    Hodgkin's lymphoma Brother    Esophageal cancer Neg Hx    Rectal cancer Neg Hx      GENETIC COUNSELING/TESTING: None  SOCIAL HISTORY:  CGERRE RANUMis [place]   Ms. SShadixis currently retired.  She denies any current or history of tobacco, alcohol, or illicit drug use.     PHYSICAL EXAMINATION:  Vital Signs:   Vitals:   11/02/19 1007 11/02/19 1054  BP: (!) 161/110 (!) 142/80  Pulse:    Resp:    Temp:    SpO2:     Filed Weights   11/02/19 1006  Weight: 209 lb 3.2 oz (94.9 kg)   General: Well-nourished, well-appearing female in no acute distress.  HEENT:   Sclerae anicteric. Lymph: No cervical, supraclavicular, or infraclavicular lymphadenopathy noted on palpation.  Cardiovascular: Regular rate and rhythm.Marland Kitchen Respiratory: Clear bilaterally. Breathing non-labored.  GI: Abdomen soft and round; non-tender, non-distended. Bowel sounds normoactive.  Neuro: No focal deficits. Steady gait.  Psych: Mood and affect normal and appropriate for situation.  Extremities: No edema. MSK:  Full range of motion in bilateral upper extremities Skin: Warm and dry. Breast exam: s/p left lumpectomy and radiation, incision healed with mild scar tissue. Mild hyperpigmentation to left lower breast. Nipple inversion at baseline without discharge. No palpable mass in either breast or axilla that I could appreciate. Benign exam.  LABORATORY DATA:  None for this visit.  DIAGNOSTIC IMAGING:  None for this visit.      ASSESSMENT AND PLAN:  Ms.. Spillman is a pleasant 69 y.o. female with Stage 0 left breast DCIS ER+/PR+/HER2-, diagnosed in 04/2019, treated with lumpectomy, adjuvant radiation therapy, and anti-estrogen therapy with Anastrozole 08/2019.  She presents to the Survivorship Clinic for our initial meeting and routine follow-up  post-completion of treatment for breast cancer.    1. Stage 0 left left breast DCIS:  Ms. Sparano has recovered well from definitive treatment for breast cancer. Breast exam is benign, no clinical concern for recurrence. She will follow-up with her medical oncologist, Dr. Burr Medico in 02/2020 with history and physical exam per surveillance protocol.  She will continue her anti-estrogen therapy with Anastrozole, she is tolerating moderately well with hot flashes, mild headaches, and insomnia. These are tolerable for her and not affecting QOL, she will continue. She was instructed to make Dr. Burr Medico or myself aware if she begins to experience any worsening side effects of the medication and I could see her back in clinic to help manage those side effects, as needed. Her next mammogram is due 04/2020, order was placed today to be done at [facility]. Today, a comprehensive survivorship care plan and treatment summary was reviewed with the patient today detailing her breast cancer diagnosis, treatment course, potential late/long-term effects of treatment, appropriate follow-up care with recommendations for the future, and patient education resources.  A copy of this summary, along with a letter will be sent to the patients primary care provider via In Basket message after todays visit.    2. Hot flashes, headache, insomnia, secondary to AI: symptoms are mild to moderate and tolerable for now, not interfering with her quality of life at this point. She had insomnia before, doesn't go to bed until 2 am, but slightly worse on anastrozole. I recommend to try benadryl and/or melatonin. Will hold off on prescription sleep aid for now. She knows to call me if this worsens. Will monitor closely.   3. Bone health:  Given Ms. Guthridge's age/history of breast cancer and her current treatment regimen including anti-estrogen therapy with anastrozole, she is at risk for bone demineralization.  Her last DEXA scan was normal on 08/2019. I  encouraged her to continue calcium and vitamin D, and was encouraged to increase her weight-bearing activities.  Repeat DEXA 12/20222. She was given education on specific activities to promote bone health.  4. Cancer screening:  Due to Ms. Lemmerman's history and her age, she should receive screening for skin cancers, colon cancer, and gynecologic cancers.  PCP manages PAP smear and annual skin checks. She believes she is due for colonoscopy, last done 10/2014 by Dr. Delfin Edis. Will refer her back to [facility] for new provider and routine screening colonoscopy. The information and recommendations are  listed on the patient's comprehensive care plan/treatment summary and were reviewed in detail with the patient.    5.  Health maintenance and wellness promotion: Ms. Swiney was encouraged to consume 5-7 servings of fruits and vegetables per day. We reviewed the "Nutrition Rainbow" handout, as well as the handout "Take Control of Your Health and Reduce Your Cancer Risk" from the [place].  She was also encouraged to engage in moderate to vigorous exercise for 30 minutes per day most days of the week. We discussed the [employer] fitness program, which is designed for cancer survivors to help them become more physically fit after cancer treatments.  She was instructed to limit her alcohol consumption and continue to abstain from tobacco use.     6. Support services/counseling: It is not uncommon for this period of the patient's cancer care trajectory to be one of many emotions and stressors.  We discussed an opportunity for her to participate in the next session of [facility] ("Finding Your New Normal") support group series designed for patients after they have completed treatment.   Ms. Dotter was encouraged to take advantage of our many other support services programs, support groups, and/or counseling in coping with her new life as a cancer survivor after completing anti-cancer treatment.  She was  offered support today through active listening and expressive supportive counseling.  She was given information regarding our available services and encouraged to contact me with any questions or for help enrolling in any of our support group/programs.    Dispo:   -Return to cancer center in 4 months  -Mammogram due in 04/2020 -Follow up with surgery as indicated, you may call Dr. Ethlyn Gallery office to make the appointment  -Referral to [facility] to establish with new provider, due for colonoscopy  -She is welcome to return back to the Survivorship Clinic at any time; no additional follow-up needed at this time.  -Consider referral back to survivorship as a long-term survivor for continued surveillance  A total of (30) minutes of face-to-face time was spent with this patient with greater than 50% of that time in counseling and care-coordination.   Cira Rue, NP Survivorship Program [facility] [phone]   Note: PRIMARY CARE PROVIDER [name], [place] [phone]

## 2019-11-03 ENCOUNTER — Telehealth: Payer: Self-pay | Admitting: *Deleted

## 2019-11-03 ENCOUNTER — Telehealth: Payer: Self-pay | Admitting: Nurse Practitioner

## 2019-11-03 NOTE — Telephone Encounter (Signed)
Dr. Silverio Decamp,  This pt's PV is tomorrow and her colonoscopy is 11-17-19.  Dr. Ardis Hughs wants [place] to check with an MD if the patient has been treated for cancer within the past year.  This pt was treated for breast cancer starting in August 2020.  I just wanted to make sure she is ok to proceed.  Thanks, [employer]

## 2019-11-03 NOTE — Telephone Encounter (Signed)
It is fine to proceed with breast Ca history.  We need to careful with mostly Pelvic cancer and radiation therapy as can be a potential for increased risk during colonoscopy. Thanks

## 2019-11-03 NOTE — Telephone Encounter (Signed)
noted 

## 2019-11-03 NOTE — Telephone Encounter (Signed)
Scheduled appt per 2/15 los.  Spoke with pt and she is aware of her appt date and time.  For the referral that was entered for [facility], an appt was scheduled for that referral.

## 2019-11-04 ENCOUNTER — Ambulatory Visit (AMBULATORY_SURGERY_CENTER): Payer: Medicare Other

## 2019-11-04 ENCOUNTER — Other Ambulatory Visit: Payer: Self-pay | Admitting: *Deleted

## 2019-11-04 ENCOUNTER — Other Ambulatory Visit: Payer: Self-pay

## 2019-11-04 VITALS — Temp 97.3°F | Ht 68.0 in | Wt 207.6 lb

## 2019-11-04 DIAGNOSIS — Z01818 Encounter for other preprocedural examination: Secondary | ICD-10-CM

## 2019-11-04 DIAGNOSIS — Z86010 Personal history of colonic polyps: Secondary | ICD-10-CM

## 2019-11-04 MED ORDER — NA SULFATE-K SULFATE-MG SULF 17.5-3.13-1.6 GM/177ML PO SOLN: 1.0000 | Freq: Once | ORAL | 0 refills | Status: AC

## 2019-11-04 MED ORDER — SIMVASTATIN 10 MG PO TABS: ORAL_TABLET | ORAL | 1 refills | Status: DC

## 2019-11-04 NOTE — Progress Notes (Signed)

## 2019-11-13 ENCOUNTER — Other Ambulatory Visit: Payer: Self-pay

## 2019-11-13 ENCOUNTER — Ambulatory Visit (INDEPENDENT_AMBULATORY_CARE_PROVIDER_SITE_OTHER): Payer: Medicare Other

## 2019-11-13 ENCOUNTER — Other Ambulatory Visit: Payer: Self-pay | Admitting: Gastroenterology

## 2019-11-13 DIAGNOSIS — Z1159 Encounter for screening for other viral diseases: Secondary | ICD-10-CM | POA: Diagnosis not present

## 2019-11-14 LAB — SARS CORONAVIRUS 2 (TAT 6-24 HRS): SARS Coronavirus 2: NEGATIVE

## 2019-11-15 ENCOUNTER — Ambulatory Visit: Payer: Medicare Other | Attending: Internal Medicine

## 2019-11-15 DIAGNOSIS — Z23 Encounter for immunization: Secondary | ICD-10-CM | POA: Insufficient documentation

## 2019-11-15 NOTE — Progress Notes (Signed)
   Covid-19 Vaccination Clinic  Name:  SYANN MCDOUGALD    MRN: [id number] DOB: Jun 04, 1951  11/15/2019  Ms. Stoops was observed post Covid-19 immunization for 15 minutes without incidence. She was provided with Vaccine Information Sheet and instruction to access the V-Safe system.   Ms. Sochor was instructed to call 911 with any severe reactions post vaccine: Marland Kitchen Difficulty breathing  . Swelling of your face and throat  . A fast heartbeat  . A bad rash all over your body  . Dizziness and weakness    Immunizations Administered    Name Date Dose VIS Date Route   Pfizer COVID-19 Vaccine 11/15/2019 10:53 AM 0.3 mL 08/28/2019 Intramuscular   Manufacturer: [place]   Lot: [id number]   [place]: [id number]

## 2019-11-17 ENCOUNTER — Ambulatory Visit (AMBULATORY_SURGERY_CENTER): Payer: Medicare Other | Admitting: Gastroenterology

## 2019-11-17 ENCOUNTER — Encounter: Payer: Self-pay | Admitting: Gastroenterology

## 2019-11-17 ENCOUNTER — Other Ambulatory Visit: Payer: Self-pay

## 2019-11-17 VITALS — BP 137/80 | HR 53 | Temp 95.9°F | Resp 11 | Ht 68.0 in | Wt 207.6 lb

## 2019-11-17 DIAGNOSIS — Z86010 Personal history of colonic polyps: Secondary | ICD-10-CM

## 2019-11-17 DIAGNOSIS — D120 Benign neoplasm of cecum: Secondary | ICD-10-CM | POA: Diagnosis not present

## 2019-11-17 DIAGNOSIS — D122 Benign neoplasm of ascending colon: Secondary | ICD-10-CM

## 2019-11-17 MED ORDER — SODIUM CHLORIDE 0.9 % IV SOLN: 500.0000 mL | INTRAVENOUS | Status: DC

## 2019-11-17 NOTE — Progress Notes (Signed)
Called to room to assist during endoscopic procedure.  Patient ID and intended procedure confirmed with present staff. Received instructions for my participation in the procedure from the performing physician.  

## 2019-11-17 NOTE — Progress Notes (Signed)
pt tolerated well. VSS. awake and to recovery. Report given to RN.  

## 2019-11-17 NOTE — Patient Instructions (Signed)
Handouts Provided:  Polyps  You have been scheduled for a repeat colonoscopy, please see appointment details.   YOU HAD AN ENDOSCOPIC PROCEDURE TODAY AT [facility]:   Refer to the procedure report that was given to you for any specific questions about what was found during the examination.  If the procedure report does not answer your questions, please call your gastroenterologist to clarify.  If you requested that your care partner not be given the details of your procedure findings, then the procedure report has been included in a sealed envelope for you to review at your convenience later.  YOU SHOULD EXPECT: Some feelings of bloating in the abdomen. Passage of more gas than usual.  Walking can help get rid of the air that was put into your GI tract during the procedure and reduce the bloating. If you had a lower endoscopy (such as a colonoscopy or flexible sigmoidoscopy) you may notice spotting of blood in your stool or on the toilet paper. If you underwent a bowel prep for your procedure, you may not have a normal bowel movement for a few days.  Please Note:  You might notice some irritation and congestion in your nose or some drainage.  This is from the oxygen used during your procedure.  There is no need for concern and it should clear up in a day or so.  SYMPTOMS TO REPORT IMMEDIATELY:   Following lower endoscopy (colonoscopy or flexible sigmoidoscopy):  Excessive amounts of blood in the stool  Significant tenderness or worsening of abdominal pains  Swelling of the abdomen that is new, acute  Fever of 100F or higher  For urgent or emergent issues, a gastroenterologist can be reached at any hour by calling [phone]. Do not use MyChart messaging for urgent concerns.    DIET:  We do recommend a small meal at first, but then you may proceed to your regular diet.  Drink plenty of fluids but you should avoid alcoholic beverages for 24 hours.  ACTIVITY:  You should  plan to take it easy for the rest of today and you should NOT DRIVE or use heavy machinery until tomorrow (because of the sedation medicines used during the test).    FOLLOW UP: Our staff will call the number listed on your records 48-72 hours following your procedure to check on you and address any questions or concerns that you may have regarding the information given to you following your procedure. If we do not reach you, we will leave a message.  We will attempt to reach you two times.  During this call, we will ask if you have developed any symptoms of COVID 19. If you develop any symptoms (ie: fever, flu-like symptoms, shortness of breath, cough etc.) before then, please call [phone].  If you test positive for Covid 19 in the 2 weeks post procedure, please call and report this information to [place].    If any biopsies were taken you will be contacted by phone or by letter within the next 1-3 weeks.  Please call us at [phone] if you have not heard about the biopsies in 3 weeks.    SIGNATURES/CONFIDENTIALITY: You and/or your care partner have signed paperwork which will be entered into your electronic medical record.  These signatures attest to the fact that that the information above on your After Visit Summary has been reviewed and is understood.  Full responsibility of the confidentiality of this discharge information lies with you and/or your care-partner.

## 2019-11-17 NOTE — Op Note (Signed)
[facility] Patient Name: [name] Procedure Date: 11/17/2019 2:09 PM MRN: [id number] Endoscopist: Mauri Pole , MD Age: 69 Referring MD:  Date of Birth: [birth year] Gender: Female Account #: [id number] Procedure:                Colonoscopy Indications:              High risk colon cancer surveillance: Personal                            history of colonic polyps, High risk colon cancer                            surveillance: Personal history of adenoma (10 mm or                            greater in size) in 2006, Last colonoscopy: 2016 Medicines:                Monitored Anesthesia Care Procedure:                Pre-Anesthesia Assessment:                           - Prior to the procedure, a History and Physical                            was performed, and patient medications and                            allergies were reviewed. The patient's tolerance of                            previous anesthesia was also reviewed. The risks                            and benefits of the procedure and the sedation                            options and risks were discussed with the patient.                            All questions were answered, and informed consent                            was obtained. Prior Anticoagulants: The patient has                            taken no previous anticoagulant or antiplatelet                            agents. ASA Grade Assessment: II - A patient with                            mild systemic disease. After reviewing the risks  and benefits, the patient was deemed in                            satisfactory condition to undergo the procedure.                           After obtaining informed consent, the colonoscope                            was passed under direct vision. Throughout the                            procedure, the patient's blood pressure, pulse, and                            oxygen  saturations were monitored continuously. The                            Colonoscope was introduced through the anus and                            advanced to the the cecum, identified by                            appendiceal orifice and ileocecal valve. The                            colonoscopy was performed without difficulty. The                            patient tolerated the procedure well. The quality                            of the bowel preparation was inadequate. The                            ileocecal valve, appendiceal orifice, and rectum                            were photographed. Scope In: 2:12:22 PM Scope Out: 2:32:00 PM Scope Withdrawal Time: 0 hours 8 minutes 20 seconds  Total Procedure Duration: 0 hours 19 minutes 38 seconds  Findings:                 The perianal and digital rectal examinations were                            normal.                           Three sessile polyps were found in the ascending                            colon and cecum. The polyps were 1 to 2 mm in size.  These polyps were removed with a cold biopsy                            forceps. Resection and retrieval were complete.                           A 5 mm polyp was found in the ascending colon. The                            polyp was sessile. The polyp was removed with a                            cold snare. Resection and retrieval were complete.                           Scattered small and large-mouthed diverticula were                            found in the sigmoid colon, descending colon,                            transverse colon and ascending colon.                           Non-bleeding internal hemorrhoids were found during                            retroflexion. The hemorrhoids were small. Complications:            No immediate complications. Estimated Blood Loss:     Estimated blood loss was minimal. Impression:               - Preparation  of the colon was inadequate.                           - Three 1 to 2 mm polyps in the ascending colon and                            in the cecum, removed with a cold biopsy forceps.                            Resected and retrieved.                           - One 5 mm polyp in the ascending colon, removed                            with a cold snare. Resected and retrieved.                           - Moderate diverticulosis in the sigmoid colon, in                            the descending colon, in the  transverse colon and                            in the ascending colon.                           - Non-bleeding internal hemorrhoids. Recommendation:           - Patient has a contact number available for                            emergencies. The signs and symptoms of potential                            delayed complications were discussed with the                            patient. Return to normal activities tomorrow.                            Written discharge instructions were provided to the                            patient.                           - Resume previous diet.                           - Continue present medications.                           - Await pathology results.                           - Repeat colonoscopy at the next available                            appointment because the bowel preparation was                            suboptimal.                           - For future colonoscopy the patient will require                            an extended preparation. If there are any                            questions, please contact the gastroenterologist. Mauri Pole, MD 11/17/2019 2:38:21 PM This report has been signed electronically.

## 2019-11-18 ENCOUNTER — Telehealth: Payer: Self-pay | Admitting: Gastroenterology

## 2019-11-18 NOTE — Telephone Encounter (Signed)
Pt had a colonoscopy yesterday and reported that she is experiencing a soreness in her chest.

## 2019-11-18 NOTE — Telephone Encounter (Signed)
Pt had colonoscopy yesterday and woke up today at noon c/o chest "soreness" mid chest area, no SOB or radiating pain. She has a hx of atyptical chest pain and she says it doesn't feel like it did when she had that, she described it as heartburn before. Her pain is 2/10. I told her it could possibly still be "air" but that we dont want to dismiss this as it could be cardiac related. I'll forward this to Dr. Silverio Decamp, in the mean time I told the patient if this worsens to let us know and go to the ER.

## 2019-11-18 NOTE — Telephone Encounter (Signed)
I do not recall having any issue during the procedure that needed oral suction to have caused sore throat.  Her symptoms suggestive of GERD.  Please advise patient to take Nexium and ok to use Tums or Gaviscon as needed.  But if she continues to have persistent chest pain, she will need to come to the ER for further evaluation.  Thank you

## 2019-11-19 ENCOUNTER — Telehealth: Payer: Self-pay

## 2019-11-19 NOTE — Telephone Encounter (Signed)
Left message on 2nd follow up call. 

## 2019-11-19 NOTE — Telephone Encounter (Signed)
Left message on follow up call. 

## 2019-11-27 ENCOUNTER — Encounter: Payer: Self-pay | Admitting: Gastroenterology

## 2019-11-30 ENCOUNTER — Other Ambulatory Visit: Payer: Self-pay

## 2019-11-30 ENCOUNTER — Ambulatory Visit (AMBULATORY_SURGERY_CENTER): Payer: Self-pay

## 2019-11-30 VITALS — Temp 97.6°F | Ht 68.0 in | Wt 206.0 lb

## 2019-11-30 DIAGNOSIS — Z01818 Encounter for other preprocedural examination: Secondary | ICD-10-CM

## 2019-11-30 DIAGNOSIS — Z86010 Personal history of colonic polyps: Secondary | ICD-10-CM

## 2019-11-30 NOTE — Progress Notes (Signed)
No allergies to soy or egg Pt is not on blood thinners or diet pills Denies issues with sedation/intubation Denies atrial flutter/fib Has hx constipation requiring 2 day pep  Emmi instructions given to pt  Pt is aware of Covid safety and care partner requirements.  Sutab sample given:  Lot [id number]    Exp  03/22

## 2019-12-04 ENCOUNTER — Ambulatory Visit (INDEPENDENT_AMBULATORY_CARE_PROVIDER_SITE_OTHER): Payer: Medicare Other

## 2019-12-04 ENCOUNTER — Other Ambulatory Visit: Payer: Self-pay

## 2019-12-04 DIAGNOSIS — Z1159 Encounter for screening for other viral diseases: Secondary | ICD-10-CM | POA: Diagnosis not present

## 2019-12-05 LAB — SARS CORONAVIRUS 2 (TAT 6-24 HRS): SARS Coronavirus 2: NEGATIVE

## 2019-12-07 ENCOUNTER — Ambulatory Visit: Payer: Medicare Other

## 2019-12-07 ENCOUNTER — Ambulatory Visit: Payer: Medicare Other | Attending: Internal Medicine

## 2019-12-07 DIAGNOSIS — Z23 Encounter for immunization: Secondary | ICD-10-CM

## 2019-12-07 NOTE — Progress Notes (Signed)
   Covid-19 Vaccination Clinic  Name:  [name]    MRN: [id number] DOB: 03/15/1951  12/07/2019  [name] was observed post Covid-19 immunization for 15 minutes without incident. She was provided with Vaccine Information Sheet and instruction to access the V-Safe system.   [name] was instructed to call 911 with any severe reactions post vaccine: Marland Kitchen Difficulty breathing  . Swelling of face and throat  . A fast heartbeat  . A bad rash all over body  . Dizziness and weakness   Immunizations Administered    Name Date Dose VIS Date Route   Pfizer COVID-19 Vaccine 12/07/2019 10:40 AM 0.3 mL 08/28/2019 Intramuscular   Manufacturer: [place]   Lot: [id number]   Ruth: [id number]

## 2019-12-09 ENCOUNTER — Encounter: Payer: Self-pay | Admitting: Gastroenterology

## 2019-12-09 ENCOUNTER — Ambulatory Visit: Payer: Medicare Other

## 2019-12-09 ENCOUNTER — Other Ambulatory Visit: Payer: Self-pay

## 2019-12-09 ENCOUNTER — Ambulatory Visit (AMBULATORY_SURGERY_CENTER): Payer: Medicare Other | Admitting: Gastroenterology

## 2019-12-09 VITALS — BP 135/69 | HR 53 | Temp 97.7°F | Resp 21 | Ht 68.0 in | Wt 206.0 lb

## 2019-12-09 DIAGNOSIS — D122 Benign neoplasm of ascending colon: Secondary | ICD-10-CM | POA: Diagnosis not present

## 2019-12-09 DIAGNOSIS — Z1211 Encounter for screening for malignant neoplasm of colon: Secondary | ICD-10-CM | POA: Diagnosis not present

## 2019-12-09 DIAGNOSIS — D120 Benign neoplasm of cecum: Secondary | ICD-10-CM | POA: Diagnosis not present

## 2019-12-09 DIAGNOSIS — Z86010 Personal history of colonic polyps: Secondary | ICD-10-CM

## 2019-12-09 MED ORDER — SODIUM CHLORIDE 0.9 % IV SOLN: 500.0000 mL | Freq: Once | INTRAVENOUS | Status: DC

## 2019-12-09 NOTE — Progress Notes (Signed)
To PACU, VSS. Report to [email or web address] 

## 2019-12-09 NOTE — Progress Notes (Signed)
Temp  LC  VS  DT  Pt's states no medical or surgical changes since previsit or office visit.    

## 2019-12-09 NOTE — Progress Notes (Signed)
Called to room to assist during endoscopic procedure.  Patient ID and intended procedure confirmed with present staff. Received instructions for my participation in the procedure from the performing physician.  

## 2019-12-09 NOTE — Patient Instructions (Signed)
 Handouts on polyps,diverticulosis ,& hemorrhoids given to you today  Await pathology results on polyps removed    YOU HAD AN ENDOSCOPIC PROCEDURE TODAY AT THE [facility]:   Refer to the procedure report that was given to you for any specific questions about what was found during the examination.  If the procedure report does not answer your questions, please call your gastroenterologist to clarify.  If you requested that your care partner not be given the details of your procedure findings, then the procedure report has been included in a sealed envelope for you to review at your convenience later.  YOU SHOULD EXPECT: Some feelings of bloating in the abdomen. Passage of more gas than usual.  Walking can help get rid of the air that was put into your GI tract during the procedure and reduce the bloating. If you had a lower endoscopy (such as a colonoscopy or flexible sigmoidoscopy) you may notice spotting of blood in your stool or on the toilet paper. If you underwent a bowel prep for your procedure, you may not have a normal bowel movement for a few days.  Please Note:  You might notice some irritation and congestion in your nose or some drainage.  This is from the oxygen used during your procedure.  There is no need for concern and it should clear up in a day or so.  SYMPTOMS TO REPORT IMMEDIATELY:   Following lower endoscopy (colonoscopy or flexible sigmoidoscopy):  Excessive amounts of blood in the stool  Significant tenderness or worsening of abdominal pains  Swelling of the abdomen that is new, acute  Fever of 100F or higher    For urgent or emergent issues, a gastroenterologist can be reached at any hour by calling [phone]. Do not use MyChart messaging for urgent concerns.    DIET:  We do recommend a small meal at first, but then you may proceed to your regular diet.  Drink plenty of fluids but you should avoid alcoholic beverages for 24 hours.  ACTIVITY:   You should plan to take it easy for the rest of today and you should NOT DRIVE or use heavy machinery until tomorrow (because of the sedation medicines used during the test).    FOLLOW UP: Our staff will call the number listed on your records 48-72 hours following your procedure to check on you and address any questions or concerns that you may have regarding the information given to you following your procedure. If we do not reach you, we will leave a message.  We will attempt to reach you two times.  During this call, we will ask if you have developed any symptoms of COVID 19. If you develop any symptoms (ie: fever, flu-like symptoms, shortness of breath, cough etc.) before then, please call [phone].  If you test positive for Covid 19 in the 2 weeks post procedure, please call and report this information to us.    If any biopsies were taken you will be contacted by phone or by letter within the next 1-3 weeks.  Please call us at [phone] if you have not heard about the biopsies in 3 weeks.    SIGNATURES/CONFIDENTIALITY: You and/or your care partner have signed paperwork which will be entered into your electronic medical record.  These signatures attest to the fact that that the information above on your After Visit Summary has been reviewed and is understood.  Full responsibility of the confidentiality of this discharge information lies with you   and/or your care-partner. 

## 2019-12-09 NOTE — Op Note (Signed)
[facility] Patient Name: [name] Procedure Date: 12/09/2019 2:50 PM MRN: [id number] Endoscopist: Mauri Pole , MD Age: 69 Referring MD:  Date of Birth: 04/23/1951 Gender: Female Account #: [id number] Procedure:                Colonoscopy Indications:              High risk colon cancer surveillance: Personal                            history of colonic polyps. Inadequate prep on last                            colonoscopy Medicines:                Monitored Anesthesia Care Procedure:                Pre-Anesthesia Assessment:                           - Prior to the procedure, a History and Physical                            was performed, and patient medications and                            allergies were reviewed. The patient's tolerance of                            previous anesthesia was also reviewed. The risks                            and benefits of the procedure and the sedation                            options and risks were discussed with the patient.                            All questions were answered, and informed consent                            was obtained. Prior Anticoagulants: The patient has                            taken no previous anticoagulant or antiplatelet                            agents. ASA Grade Assessment: II - A patient with                            mild systemic disease. After reviewing the risks                            and benefits, the patient was deemed in  satisfactory condition to undergo the procedure.                           After obtaining informed consent, the colonoscope                            was passed under direct vision. Throughout the                            procedure, the patient's blood pressure, pulse, and                            oxygen saturations were monitored continuously. The                            Colonoscope was introduced through the anus and                             advanced to the the cecum, identified by                            appendiceal orifice and ileocecal valve. The                            colonoscopy was performed without difficulty. The                            patient tolerated the procedure well. The quality                            of the bowel preparation was excellent. The                            ileocecal valve, appendiceal orifice, and rectum                            were photographed. Scope In: 2:56:11 PM Scope Out: 3:18:42 PM Scope Withdrawal Time: 0 hours 9 minutes 49 seconds  Total Procedure Duration: 0 hours 22 minutes 31 seconds  Findings:                 The perianal and digital rectal examinations were                            normal.                           A less than 1 mm polyp was found in the ileocecal                            valve. The polyp was sessile. The polyp was removed                            with a cold biopsy forceps. Resection and retrieval  were complete.                           Two sessile polyps were found in the ascending                            colon. The polyps were 4 to 9 mm in size. These                            polyps were removed with a cold snare. Resection                            and retrieval were complete.                           Multiple small and large-mouthed diverticula were                            found in the sigmoid colon, descending colon,                            transverse colon and ascending colon.                           Non-bleeding internal hemorrhoids were found during                            retroflexion. The hemorrhoids were medium-sized.                           The exam was otherwise without abnormality. Complications:            No immediate complications. Estimated Blood Loss:     Estimated blood loss was minimal. Impression:               - One less than 1 mm polyp at the  ileocecal valve,                            removed with a cold biopsy forceps. Resected and                            retrieved.                           - Two 4 to 9 mm polyps in the ascending colon,                            removed with a cold snare. Resected and retrieved.                           - Moderate diverticulosis in the sigmoid colon, in                            the descending colon, in the transverse colon and  in the ascending colon.                           - Non-bleeding internal hemorrhoids.                           - The examination was otherwise normal. Recommendation:           - Patient has a contact number available for                            emergencies. The signs and symptoms of potential                            delayed complications were discussed with the                            patient. Return to normal activities tomorrow.                            Written discharge instructions were provided to the                            patient.                           - Resume previous diet.                           - Continue present medications.                           - Await pathology results.                           - Repeat colonoscopy in 3 years for surveillance                            based on pathology results. Mauri Pole, MD 12/09/2019 3:25:09 PM This report has been signed electronically.

## 2019-12-10 ENCOUNTER — Encounter: Payer: Self-pay | Admitting: Gastroenterology

## 2019-12-11 ENCOUNTER — Telehealth: Payer: Self-pay | Admitting: *Deleted

## 2019-12-11 NOTE — Telephone Encounter (Signed)
  Follow up Call-  Call back number 12/09/2019 11/17/2019  Post procedure Call Back phone  # [phone]  Permission to leave phone message Yes Yes  Some recent data might be hidden     Patient questions:  Do you have a fever, pain , or abdominal swelling? No. Pain Score  0 *  Have you tolerated food without any problems? Yes.    Have you been able to return to your normal activities? Yes.    Do you have any questions about your discharge instructions: Diet   No. Medications  No. Follow up visit  No.  Do you have questions or concerns about your Care? No.  Actions: * If pain score is 4 or above: No action needed, pain <4.  1. Have you developed a fever since your procedure? no  2.   Have you had an respiratory symptoms (SOB or cough) since your procedure? no  3.   Have you tested positive for COVID 19 since your procedure no  4.   Have you had any family members/close contacts diagnosed with the COVID 19 since your procedure?  no   If yes to any of these questions please route to Joylene John, RN and Erenest Rasher, RN

## 2019-12-11 NOTE — Telephone Encounter (Signed)
Left message on f/u call 

## 2019-12-21 ENCOUNTER — Encounter: Payer: Self-pay | Admitting: Gastroenterology

## 2019-12-31 DIAGNOSIS — D0512 Intraductal carcinoma in situ of left breast: Secondary | ICD-10-CM | POA: Diagnosis not present

## 2020-02-21 ENCOUNTER — Other Ambulatory Visit: Payer: Self-pay | Admitting: Hematology

## 2020-02-21 DIAGNOSIS — D0512 Intraductal carcinoma in situ of left breast: Secondary | ICD-10-CM

## 2020-03-01 ENCOUNTER — Other Ambulatory Visit: Payer: Self-pay

## 2020-03-01 DIAGNOSIS — D0512 Intraductal carcinoma in situ of left breast: Secondary | ICD-10-CM

## 2020-03-01 NOTE — Progress Notes (Signed)
[facility]   [phone] [phone]   Clinic Follow up Note   Patient Care Team: [name], MD as PCP - General (Family Medicine) [name] as Physician Assistant (Physician Assistant) [name], RN as Oncology Nurse Navigator [name], RN as Oncology Nurse Navigator [name], MD as Consulting Physician (General Surgery) [name], MD as Consulting Physician (Hematology) [name], MD as Attending Physician (Radiation Oncology) [name], NP as Nurse Practitioner (Nurse Practitioner) 03/02/2020  CHIEF COMPLAINT: F/u DCIS   SUMMARY OF ONCOLOGIC HISTORY: Oncology History  Ductal carcinoma in situ (DCIS) of left breast  04/27/2019 Initial Biopsy   Diagnosis 04/27/19 Breast, left, needle core biopsy, lower outer calcification, 10cmfn - DUCTAL CARCINOMA IN SITU WITH CALCIFICATIONS. - SEE COMMENT.  Results: IMMUNOHISTOCHEMICAL AND MORPHOMETRIC ANALYSIS PERFORMED MANUALLY Estrogen Receptor: 100%, POSITIVE, STRONG STAINING INTENSITY Progesterone Receptor: 80%, POSITIVE, STRONG STAINING INTENSITY   04/30/2019 Initial Diagnosis   Ductal carcinoma in situ (DCIS) of left breast   05/06/2019 Cancer Staging   Staging form: Breast, AJCC 8th Edition - Clinical: Stage 0 (cTis (DCIS), cN0, cM0, ER+, PR+, HER2: Not Assessed) - Signed by [name], MD on 05/06/2019   06/04/2019 Surgery   LEFT BREAST LUMPECTOMY WITH RADIOACTIVE SEED LOCALIZATION by Dr Marlou Starks  06/04/19    06/04/2019 Pathology Results   DIAGNOSIS: 06/04/19  A. BREAST, LEFT, LUMPECTOMY:  -  Ductal carcinoma in situ, intermediate grade, 0.3 cm  -  Margins uninvolved by carcinoma (0.2 cm; medial margin)  -  Previous biopsy site changes  -  See oncology table below   B. BREAST, LEFT SUPERIOR MARGIN, EXCISION:  -  Focal residual ductal carcinoma in situ  -  Margins uninvolved by carcinoma (0.3 cm)   C. BREAST, LEFT MEDIAL MARGIN, EXCISION:  -  No  residual carcinoma identified   D. BREAST, LEFT DEEP MARGIN, EXCISION:  -  No residual carcinoma identified     07/06/2019 - 07/31/2019 Radiation Therapy   Adjuvant Radiation with Dr Isidore Moos 07/06/19-07/31/19   08/2019 -  Anti-estrogen oral therapy   Began anastrozole   11/02/2019 Survivorship   SCP delivered by Cira Rue, NP      CURRENT THERAPY: Anastrozole, starting 08/2019  INTERVAL HISTORY: [name] returns for f/u as scheduled. She was last seen 10/2019 for SCP visit. She is doing well. She continues anastrozole. Has manageable hot flashes 2-3 times per day. She has intermittent left wrist discomfort which she had in the past, attributes to arthritis. After walking long distances she has occasional right ankle pain. Denies back/hip or other pain. Takes calcium and vitamin D. Denies concerns in her breasts such as new lump/mass or nipple change. She developed constipation after colonoscopy in March requires laxative once per month. Denies bleeding. No recent fever, chills, cough, chest pain, dyspnea, leg edema or other concerns.    MEDICAL HISTORY:  Past Medical History:  Diagnosis Date  . Allergy    per pt  . Anxiety   . Arthritis    knees   . Ductal carcinoma in situ of breast    left  . GERD (gastroesophageal reflux disease)   . Heart murmur    hx of in past  . Heart palpitations   . Hyperlipidemia   . Hypertension   . Microscopic hematuria   . Obesity     SURGICAL HISTORY: Past Surgical History:  Procedure Laterality Date  . BREAST LUMPECTOMY WITH RADIOACTIVE SEED LOCALIZATION Left 06/04/2019  Procedure: LEFT BREAST LUMPECTOMY WITH RADIOACTIVE SEED LOCALIZATION;  Surgeon: [name], MD;  Location: [facility];  Service: General;  Laterality: Left;  . calcium removed from left heel    . COLONOSCOPY  2016  . HEEL SPUR SURGERY    . KNEE ARTHROSCOPY Left 04/22/2013   Procedure: LEFT KNEE ARTHROSCOPY WITH medial meniscusectomy and  chondroplasty;  Surgeon: Gearlean Alf, MD;  Location: [facility];  Service: Orthopedics;  Laterality: Left;  . POLYPECTOMY    . TONSILLECTOMY    . TOTAL KNEE ARTHROPLASTY Right 02/16/2019   Procedure: TOTAL KNEE ARTHROPLASTY;  Surgeon: Gaynelle Arabian, MD;  Location: [facility];  Service: Orthopedics;  Laterality: Right;  57mn  . TUBAL LIGATION      I have reviewed the social history and family history with the patient and they are unchanged from previous note.  ALLERGIES:  has No Known Allergies.  MEDICATIONS:  Current Outpatient Medications  Medication Sig Dispense Refill  . anastrozole (ARIMIDEX) 1 MG tablet Take 1 tablet (1 mg total) by mouth daily. 90 tablet 2  . Ascorbic Acid (VITAMIN C) 1000 MG tablet Take 1,000 mg by mouth daily.    .Marland Kitchenaspirin EC 81 MG tablet Take 81 mg by mouth daily.    . calcium carbonate (OS-CAL) 600 MG TABS tablet Take 1 tablet (600 mg total) by mouth daily. 60 tablet 1  . cholecalciferol (VITAMIN D) 1000 units tablet Take 1 tablet (1,000 Units total) by mouth daily. 30 tablet 1  . esomeprazole (NEXIUM) 20 MG capsule Take 20 mg by mouth daily at 12 noon. Patient takes as needed    . hydrochlorothiazide (HYDRODIURIL) 25 MG tablet TAKE 1 TABLET(25 MG) BY MOUTH DAILY 90 tablet 3  . Multiple Vitamin (MULTIVITAMIN) tablet Take 1 tablet by mouth daily. 30 tablet 1  . simvastatin (ZOCOR) 10 MG tablet TAKE 1 TABLET BY MOUTH EVERY DAY AT 6PM 90 tablet 1   No current facility-administered medications for this visit.    PHYSICAL EXAMINATION: ECOG PERFORMANCE STATUS: 0 - Asymptomatic  Vitals:   03/02/20 1514 03/02/20 1515  BP: (!) 152/90 133/86  Pulse: 69   Resp: 18   Temp: 97.7 F (36.5 C)   SpO2: 99%    Filed Weights   03/02/20 1514  Weight: 207 lb 9.6 oz (94.2 kg)    GENERAL:alert, no distress and comfortable SKIN: no rash to exposed skin  EYES: sclera clear NECK: without mass LYMPH:  no palpable cervical or supraclavicular lymphadenopathy  LUNGS: clear  with normal breathing effort HEART: regular rate & rhythm, no lower extremity edema ABDOMEN: abdomen soft, non-tender and normal bowel sounds Musculoskeletal: no focal spinal or joint tenderness  NEURO: alert & oriented x 3 with fluent speech, normal gait BREAST: s/p left lumpectomy with scar tissue at the healed incision. No other palpable mass in either breast or axilla that I could appreciate.   LABORATORY DATA:  I have reviewed the data as listed CBC Latest Ref Rng & Units 03/02/2020 05/06/2019 02/18/2019  WBC 4.0 - 10.5 K/uL 6.3 7.2 11.2(H)  Hemoglobin 12.0 - 15.0 g/dL 13.7 14.0 11.6(L)  Hematocrit 36 - 46 % 42.3 43.2 36.5  Platelets 150 - 400 K/uL 220 253 217     CMP Latest Ref Rng & Units 03/02/2020 05/06/2019 02/18/2019  Glucose 70 - 99 mg/dL 96 97 119(H)  BUN 8 - 23 mg/dL _0 Creatinine 0.44 - 1.00 mg/dL 0.93 0.78 0.63  Sodium 135 - 145 mmol/L  146(H) 141 140  Potassium 3.5 - 5.1 mmol/L 3.6 3.6 3.1(L)  Chloride 98 - 111 mmol/L 108 106 106  CO2 22 - 32 mmol/L _0 Calcium 8.9 - 10.3 mg/dL 9.4 9.4 8.6(L)  Total Protein 6.5 - 8.1 g/dL 6.5 6.9 -  Total Bilirubin 0.3 - 1.2 mg/dL 0.5 0.4 -  Alkaline Phos 38 - 126 U/L 53 60 -  AST 15 - 41 U/L 14(L) 15 -  ALT 0 - 44 U/L 12 15 -      RADIOGRAPHIC STUDIES: I have personally reviewed the radiological images as listed and agreed with the findings in the report. No results found.   ASSESSMENT & PLAN: 69 yo female with   1. Left breast DCIS ER+/PR+/HER2, grade 2 -diagnosed in 04/2019, s/p lumpectomy and adjuvant radiation therapy 10/20-07/2019 -DEXA 07/2019 normal, continue calcium/vitamin D -She began anti-estrogen therapy with Anastrozole 08/2019   -SCP visit 11/02/2019  -on breast cancer surveillance   2. HTN, HL -on HCTZ and statin    Disposition:  Ms. Klammer is clinically doing well. Exam is benign, labs normal except Na 146 and AST 14. No clinical concern for recurrence. She is tolerating anastrozole with  tolerable hot flashes. I don't feel strongly that her ankle and wrist pains are related to AI. Will monitor closely. Continue surveillance.   She is due diagnostic mammogram which is scheduled for July 2021 at [facility]. She will repeat DEXA in 07/2021. She continues calcium and vitamin D. I encouraged her to continue healthy lifestyle practices.   She will return for surveillance f/u in 4-6 months. She knows to call sooner if her joints pains worsen or she has new bone pain or other concerns.   All questions were answered. The patient knows to call the clinic with any problems, questions or concerns. No barriers to learning were detected.     [name], NP 03/02/20

## 2020-03-02 ENCOUNTER — Inpatient Hospital Stay: Payer: Medicare Other | Attending: Nurse Practitioner | Admitting: Nurse Practitioner

## 2020-03-02 ENCOUNTER — Encounter: Payer: Self-pay | Admitting: Nurse Practitioner

## 2020-03-02 ENCOUNTER — Other Ambulatory Visit: Payer: Self-pay

## 2020-03-02 ENCOUNTER — Inpatient Hospital Stay: Payer: Medicare Other

## 2020-03-02 VITALS — BP 133/86 | HR 69 | Temp 97.7°F | Resp 18 | Ht 68.0 in | Wt 207.6 lb

## 2020-03-02 DIAGNOSIS — M25532 Pain in left wrist: Secondary | ICD-10-CM | POA: Insufficient documentation

## 2020-03-02 DIAGNOSIS — I10 Essential (primary) hypertension: Secondary | ICD-10-CM | POA: Diagnosis not present

## 2020-03-02 DIAGNOSIS — M25571 Pain in right ankle and joints of right foot: Secondary | ICD-10-CM | POA: Insufficient documentation

## 2020-03-02 DIAGNOSIS — D0512 Intraductal carcinoma in situ of left breast: Secondary | ICD-10-CM | POA: Insufficient documentation

## 2020-03-02 DIAGNOSIS — N951 Menopausal and female climacteric states: Secondary | ICD-10-CM | POA: Insufficient documentation

## 2020-03-02 LAB — CBC WITH DIFFERENTIAL (CANCER CENTER ONLY)
Abs Immature Granulocytes: 0.02 10*3/uL (ref 0.00–0.07)
Basophils Absolute: 0 10*3/uL (ref 0.0–0.1)
Basophils Relative: 1 %
Eosinophils Absolute: 0.2 10*3/uL (ref 0.0–0.5)
Eosinophils Relative: 4 %
HCT: 42.3 % (ref 36.0–46.0)
Hemoglobin: 13.7 g/dL (ref 12.0–15.0)
Immature Granulocytes: 0 %
Lymphocytes Relative: 18 %
Lymphs Abs: 1.1 10*3/uL (ref 0.7–4.0)
MCH: 28.8 pg (ref 26.0–34.0)
MCHC: 32.4 g/dL (ref 30.0–36.0)
MCV: 88.9 fL (ref 80.0–100.0)
Monocytes Absolute: 0.6 10*3/uL (ref 0.1–1.0)
Monocytes Relative: 9 %
Neutro Abs: 4.3 10*3/uL (ref 1.7–7.7)
Neutrophils Relative %: 68 %
Platelet Count: 220 10*3/uL (ref 150–400)
RBC: 4.76 MIL/uL (ref 3.87–5.11)
RDW: 13.2 % (ref 11.5–15.5)
WBC Count: 6.3 10*3/uL (ref 4.0–10.5)
nRBC: 0 % (ref 0.0–0.2)

## 2020-03-02 LAB — CMP (CANCER CENTER ONLY)
ALT: 12 U/L (ref 0–44)
AST: 14 U/L — ABNORMAL LOW (ref 15–41)
Albumin: 3.7 g/dL (ref 3.5–5.0)
Alkaline Phosphatase: 53 U/L (ref 38–126)
Anion gap: 11 (ref 5–15)
BUN: 11 mg/dL (ref 8–23)
CO2: 27 mmol/L (ref 22–32)
Calcium: 9.4 mg/dL (ref 8.9–10.3)
Chloride: 108 mmol/L (ref 98–111)
Creatinine: 0.93 mg/dL (ref 0.44–1.00)
GFR, Est AFR Am: >60 mL/min (ref >60)
GFR, Estimated: >60 mL/min (ref >60)
Glucose, Bld: 96 mg/dL (ref 70–99)
Potassium: 3.6 mmol/L (ref 3.5–5.1)
Sodium: 146 mmol/L — ABNORMAL HIGH (ref 135–145)
Total Bilirubin: 0.5 mg/dL (ref 0.3–1.2)
Total Protein: 6.5 g/dL (ref 6.5–8.1)

## 2020-03-02 MED ORDER — ANASTROZOLE 1 MG PO TABS: 1.0000 mg | ORAL_TABLET | Freq: Every day | ORAL | 2 refills | Status: DC

## 2020-03-03 ENCOUNTER — Telehealth: Payer: Self-pay | Admitting: Nurse Practitioner

## 2020-03-03 NOTE — Telephone Encounter (Signed)
Scheduled appt per 6/16 los.  Spoke with pt and she is aware of the appt date and time.

## 2020-04-29 ENCOUNTER — Other Ambulatory Visit: Payer: Self-pay | Admitting: Family Medicine

## 2020-05-02 ENCOUNTER — Other Ambulatory Visit: Payer: Self-pay

## 2020-05-02 ENCOUNTER — Ambulatory Visit (INDEPENDENT_AMBULATORY_CARE_PROVIDER_SITE_OTHER): Payer: Medicare Other | Admitting: Family Medicine

## 2020-05-02 VITALS — BP 140/70 | HR 74 | Temp 98.7°F | Ht 68.0 in | Wt 206.0 lb

## 2020-05-02 DIAGNOSIS — Z124 Encounter for screening for malignant neoplasm of cervix: Secondary | ICD-10-CM

## 2020-05-02 DIAGNOSIS — Z01419 Encounter for gynecological examination (general) (routine) without abnormal findings: Secondary | ICD-10-CM

## 2020-05-02 DIAGNOSIS — Z0000 Encounter for general adult medical examination without abnormal findings: Secondary | ICD-10-CM

## 2020-05-02 DIAGNOSIS — Z0001 Encounter for general adult medical examination with abnormal findings: Secondary | ICD-10-CM | POA: Diagnosis not present

## 2020-05-02 DIAGNOSIS — E7800 Pure hypercholesterolemia, unspecified: Secondary | ICD-10-CM | POA: Diagnosis not present

## 2020-05-02 DIAGNOSIS — I10 Essential (primary) hypertension: Secondary | ICD-10-CM | POA: Diagnosis not present

## 2020-05-02 DIAGNOSIS — D0512 Intraductal carcinoma in situ of left breast: Secondary | ICD-10-CM

## 2020-05-02 LAB — LIPID PANEL
Cholesterol: 185 mg/dL (ref <200)
HDL: 56 mg/dL (ref >=50)
LDL Cholesterol (Calc): 105 mg/dL (calc) — ABNORMAL HIGH
Non-HDL Cholesterol (Calc): 129 mg/dL (calc) (ref <130)
Total CHOL/HDL Ratio: 3.3 (calc) (ref <5.0)
Triglycerides: 141 mg/dL (ref <150)

## 2020-05-02 NOTE — Progress Notes (Signed)
Subjective:    Patient ID: [name], female    DOB: May 29, 1951, 69 y.o.   MRN: [id number]  HPI Patient is a very pleasant 69 year old Caucasian female here today for CPE.  She denies any angina.  She denies any chest pain.  She denies any shortness of breath or dyspnea on exertion. She does have a very significant family history of cardiovascular disease in her mother, her brother, and 3 of her maternal uncles.  Patient has been diagnosed with ductal carcinoma in situ of the breast last year.  She is following up regularly with her cancer specialist.  Patient had a lumpectomy with local radiation treatment.  She is also on Arimidex.  She had a bone density test performed last year that showed mild low bone mass.  She is on calcium and vitamin D.  She is not due to repeat her bone density test for 1 to 2 years.  Immunizations are up-to-date as shown below: Immunization History  Administered Date(s) Administered  . Fluad Quad(high Dose 65+) 08/04/2019  . Influenza Split 06/26/2013  . Influenza Whole 06/19/2007  . Influenza, High Dose Seasonal PF 06/24/2018  . Influenza,inj,Quad PF,6+ Mos 07/15/2014, 07/21/2015, 07/17/2016, 07/03/2017  . PFIZER SARS-COV-2 Vaccination 11/15/2019, 12/07/2019  . Pneumococcal Conjugate-13 07/07/2018  . Pneumococcal Polysaccharide-23 08/04/2019  . Td 09/18/2003, 04/25/2011  . Tdap 04/25/2011  . Zoster 09/22/2013   Colonoscopy was performed earlier this year.  The patient did have tubular adenomas and they recommended a repeat colonoscopy in 3 to 5 years.  Patient is due for her Pap smear. Past Medical History:  Diagnosis Date  . Allergy    per pt  . Anxiety   . Arthritis    knees   . Ductal carcinoma in situ of breast    left  . GERD (gastroesophageal reflux disease)   . Heart murmur    hx of in past  . Heart palpitations   . Hyperlipidemia   . Hypertension   . Microscopic hematuria   . Obesity    Past Surgical History:  Procedure Laterality  Date  . BREAST LUMPECTOMY WITH RADIOACTIVE SEED LOCALIZATION Left 06/04/2019   Procedure: LEFT BREAST LUMPECTOMY WITH RADIOACTIVE SEED LOCALIZATION;  Surgeon: [name], MD;  Location: [place];  Service: General;  Laterality: Left;  . calcium removed from left heel    . COLONOSCOPY  2016  . HEEL SPUR SURGERY    . KNEE ARTHROSCOPY Left 04/22/2013   Procedure: LEFT KNEE ARTHROSCOPY WITH medial meniscusectomy and chondroplasty;  Surgeon: Gearlean Alf, MD;  Location: [facility];  Service: Orthopedics;  Laterality: Left;  . POLYPECTOMY    . TONSILLECTOMY    . TOTAL KNEE ARTHROPLASTY Right 02/16/2019   Procedure: TOTAL KNEE ARTHROPLASTY;  Surgeon: Gaynelle Arabian, MD;  Location: [facility];  Service: Orthopedics;  Laterality: Right;  45min  . TUBAL LIGATION     Current Outpatient Medications on File Prior to Visit  Medication Sig Dispense Refill  . anastrozole (ARIMIDEX) 1 MG tablet Take 1 tablet (1 mg total) by mouth daily. 90 tablet 2  . Ascorbic Acid (VITAMIN C) 1000 MG tablet Take 1,000 mg by mouth daily.    Marland Kitchen aspirin EC 81 MG tablet Take 81 mg by mouth daily.    . calcium carbonate (OS-CAL) 600 MG TABS tablet Take 1 tablet (600 mg total) by mouth daily. 60 tablet 1  . cholecalciferol (VITAMIN D) 1000 units tablet Take 1 tablet (1,000 Units total) by mouth  daily. 30 tablet 1  . esomeprazole (NEXIUM) 20 MG capsule Take 20 mg by mouth daily at 12 noon. Patient takes as needed    . hydrochlorothiazide (HYDRODIURIL) 25 MG tablet TAKE 1 TABLET(25 MG) BY MOUTH DAILY 90 tablet 3  . Multiple Vitamin (MULTIVITAMIN) tablet Take 1 tablet by mouth daily. 30 tablet 1  . simvastatin (ZOCOR) 10 MG tablet TAKE 1 TABLET BY MOUTH EVERY DAY AT 6PM 90 tablet 1   No current facility-administered medications on file prior to visit.   No Known Allergies Social History   Socioeconomic History  . Marital status: Married    Spouse name: Not on file  . Number of children: 3  . Years of education:  Not on file  . Highest education level: Not on file  Occupational History  . Occupation: retired   Tobacco Use  . Smoking status: Former Smoker    Types: Cigarettes  . Smokeless tobacco: Never Used  . Tobacco comment: just smoked in high school  Vaping Use  . Vaping Use: Never used  Substance and Sexual Activity  . Alcohol use: Not Currently  . Drug use: No  . Sexual activity: Yes    Birth control/protection: Surgical  Other Topics Concern  . Not on file  Social History Narrative   Worked in [employer].   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       No other exercise.   Married.       Never smoked.   Social Determinants of Health   Financial Resource Strain:   . Difficulty of Paying Living Expenses:   Food Insecurity:   . Worried About Charity fundraiser in the Last Year:   . Arboriculturist in the Last Year:   Transportation Needs: No Transportation Needs  . Lack of Transportation (Medical): No  . Lack of Transportation (Non-Medical): No  Physical Activity:   . Days of Exercise per Week:   . Minutes of Exercise per Session:   Stress:   . Feeling of Stress :   Social Connections:   . Frequency of Communication with Friends and Family:   . Frequency of Social Gatherings with Friends and Family:   . Attends Religious Services:   . Active Member of Clubs or Organizations:   . Attends Archivist Meetings:   Marland Kitchen Marital Status:   Intimate Partner Violence: Not At Risk  . Fear of Current or Ex-Partner: No  . Emotionally Abused: No  . Physically Abused: No  . Sexually Abused: No   Family History  Problem Relation Age of Onset  . Heart disease Mother 64       MI at 66 and 72  . Cancer Father 69       Prostate Cancer  . Colon polyps Father   . Stomach cancer Father   . Hodgkin's lymphoma Brother   . Esophageal cancer Neg Hx   . Rectal cancer Neg Hx   . Colon cancer Neg Hx      Review of  Systems  All other systems reviewed and are negative.      Objective:   Physical Exam Vitals reviewed. Exam conducted with a chaperone present.  Constitutional:      General: She is not in acute distress.    Appearance: Normal appearance. She is normal weight. She is not ill-appearing, toxic-appearing or diaphoretic.  Cardiovascular:     Rate and  Rhythm: Normal rate and regular rhythm.     Pulses: Normal pulses.     Heart sounds: Normal heart sounds. No murmur heard.  No friction rub. No gallop.   Pulmonary:     Effort: Pulmonary effort is normal. No respiratory distress.     Breath sounds: Normal breath sounds. No stridor. No wheezing, rhonchi or rales.  Chest:     Chest wall: No tenderness.  Abdominal:     General: Bowel sounds are normal. There is no distension.     Palpations: Abdomen is soft.     Tenderness: There is no abdominal tenderness. There is no guarding or rebound.  Genitourinary:    Vagina: Normal.     Cervix: Normal.     Uterus: Normal.      Adnexa: Right adnexa normal and left adnexa normal.       Right: No mass.         Left: No mass.    Musculoskeletal:     Right lower leg: No edema.     Left lower leg: No edema.  Lymphadenopathy:     Cervical: No cervical adenopathy.  Neurological:     General: No focal deficit present.     Mental Status: She is alert and oriented to person, place, and time.     Cranial Nerves: No cranial nerve deficit.     Sensory: No sensory deficit.     Coordination: Coordination normal.           Assessment & Plan:  Cervical cancer screening - Plan: PAP, Thin Prep w/HPV rflx HPV Type 16/18  Pure hypercholesterolemia - Plan: Lipid panel  Ductal carcinoma in situ (DCIS) of left breast  Essential hypertension  General medical exam  Patient is due to check her cholesterol.  Given her strong family history of cardiovascular disease, I would recommend the patient take an aspirin 81 mg a day and I would recommend that we  try to drive her LDL cholesterol is close to 70 as possible.  If necessary we will switch from simvastatin to Crestor.  At the present time she is asymptomatic and therefore I do not see an indication that would warrant a stress test.  Breast exam and mammogram are up-to-date.  Colonoscopy is up-to-date.  Pap smear sent to pathology in a labeled container.  Immunizations are up-to-date.  Patient denies any falls, depression, or memory loss.

## 2020-05-04 ENCOUNTER — Other Ambulatory Visit: Payer: Self-pay

## 2020-05-04 LAB — PAP, TP IMAGING W/ HPV RNA, RFLX HPV TYPE 16,18/45: HPV DNA High Risk: NOT DETECTED

## 2020-05-04 MED ORDER — ROSUVASTATIN CALCIUM 20 MG PO TABS
20.0000 mg | ORAL_TABLET | Freq: Every day | ORAL | 3 refills | Status: DC
Start: 2020-05-04 — End: 2020-09-15

## 2020-05-26 ENCOUNTER — Encounter: Payer: Self-pay | Admitting: Family Medicine

## 2020-05-26 DIAGNOSIS — R928 Other abnormal and inconclusive findings on diagnostic imaging of breast: Secondary | ICD-10-CM | POA: Diagnosis not present

## 2020-05-26 DIAGNOSIS — Z853 Personal history of malignant neoplasm of breast: Secondary | ICD-10-CM | POA: Diagnosis not present

## 2020-06-29 ENCOUNTER — Ambulatory Visit (INDEPENDENT_AMBULATORY_CARE_PROVIDER_SITE_OTHER): Payer: Medicare Other

## 2020-06-29 ENCOUNTER — Other Ambulatory Visit: Payer: Self-pay

## 2020-06-29 DIAGNOSIS — Z23 Encounter for immunization: Secondary | ICD-10-CM | POA: Diagnosis not present

## 2020-07-11 DIAGNOSIS — D0512 Intraductal carcinoma in situ of left breast: Secondary | ICD-10-CM | POA: Diagnosis not present

## 2020-07-14 DIAGNOSIS — Z23 Encounter for immunization: Secondary | ICD-10-CM | POA: Diagnosis not present

## 2020-07-27 NOTE — Progress Notes (Signed)
[place]   [phone] [phone]   Clinic Follow up Note   Patient Care Team: Andrea Frizzle, MD as PCP - General (Family Medicine) [name] as Physician Assistant (Physician Assistant) [name], RN as Oncology Nurse Navigator [name], RN as Oncology Nurse Navigator [name], MD as Consulting Physician (General Surgery) [name], MD as Consulting Physician (Hematology) [name], MD as Attending Physician (Radiation Oncology) [name], NP as Nurse Practitioner (Nurse Practitioner)  Date of Service:  07/29/2020  CHIEF COMPLAINT: F/u of Left breast DCIS  SUMMARY OF ONCOLOGIC HISTORY: Oncology History Overview Note  Cancer Staging Ductal carcinoma in situ (DCIS) of left breast Staging form: Breast, AJCC 8th Edition - Clinical: Stage 0 (cTis (DCIS), cN0, cM0, ER+, PR+, HER2: Not Assessed) - Signed by [name], MD on 05/06/2019 - Pathologic: No stage assigned - Unsigned    Ductal carcinoma in situ (DCIS) of left breast  04/27/2019 Initial Biopsy   Diagnosis 04/27/19 Breast, left, needle core biopsy, lower outer calcification, 10cmfn - DUCTAL CARCINOMA IN SITU WITH CALCIFICATIONS. - SEE COMMENT.  Results: IMMUNOHISTOCHEMICAL AND MORPHOMETRIC ANALYSIS PERFORMED MANUALLY Estrogen Receptor: 100%, POSITIVE, STRONG STAINING INTENSITY Progesterone Receptor: 80%, POSITIVE, STRONG STAINING INTENSITY   04/30/2019 Initial Diagnosis   Ductal carcinoma in situ (DCIS) of left breast   05/06/2019 Cancer Staging   Staging form: Breast, AJCC 8th Edition - Clinical: Stage 0 (cTis (DCIS), cN0, cM0, ER+, PR+, HER2: Not Assessed) - Signed by [name], MD on 05/06/2019   06/04/2019 Surgery   LEFT BREAST LUMPECTOMY WITH RADIOACTIVE SEED LOCALIZATION by Dr Marlou Starks  06/04/19    06/04/2019 Pathology Results   DIAGNOSIS: 06/04/19  A. BREAST, LEFT, LUMPECTOMY:  -  Ductal carcinoma in situ, intermediate grade, 0.3 cm    -  Margins uninvolved by carcinoma (0.2 cm; medial margin)  -  Previous biopsy site changes  -  See oncology table below   B. BREAST, LEFT SUPERIOR MARGIN, EXCISION:  -  Focal residual ductal carcinoma in situ  -  Margins uninvolved by carcinoma (0.3 cm)   C. BREAST, LEFT MEDIAL MARGIN, EXCISION:  -  No residual carcinoma identified   D. BREAST, LEFT DEEP MARGIN, EXCISION:  -  No residual carcinoma identified     07/06/2019 - 07/31/2019 Radiation Therapy   Adjuvant Radiation with Dr Isidore Moos 07/06/19-07/31/19   08/2019 -  Anti-estrogen oral therapy   Anastrozole once daily starting in 08/2019   11/02/2019 Survivorship   SCP delivered by Cira Rue, NP       CURRENT THERAPY:  Anastrozole 41m once daily starting 08/2019   INTERVAL HISTORY:  CCENIA ZARAGOSAis here for a follow up of left breast cancer. She was last seen by me 1 year ago and seen by NP Lacie in interim. She presents to the clinic alone. She notes she is doing well. She notes recent onset of joint pain in hands and in her knees. She feels she can continue anastrozole for now as it is manageable. She notes she has received her flu shot this year and her COVID booster shot.    REVIEW OF SYSTEMS:   Constitutional: Denies fevers, chills or abnormal weight loss Eyes: Denies blurriness of vision Ears, nose, mouth, throat, and face: Denies mucositis or sore throat Respiratory: Denies cough, dyspnea or wheezes Cardiovascular: Denies palpitation, chest discomfort or lower extremity swelling Gastrointestinal:  Denies nausea, heartburn or change in bowel habits Skin: Denies abnormal skin  rashes MSK: (+) joint pain in hands and knees Lymphatics: Denies new lymphadenopathy or easy bruising Neurological:Denies numbness, tingling or new weaknesses Behavioral/Psych: Mood is stable, no new changes  All other systems were reviewed with the patient and are negative.  MEDICAL HISTORY:  Past Medical History:  Diagnosis Date   . Allergy    per pt  . Anxiety   . Arthritis    knees   . Ductal carcinoma in situ of breast    left  . GERD (gastroesophageal reflux disease)   . Heart murmur    hx of in past  . Heart palpitations   . Hyperlipidemia   . Hypertension   . Microscopic hematuria   . Obesity     SURGICAL HISTORY: Past Surgical History:  Procedure Laterality Date  . BREAST LUMPECTOMY WITH RADIOACTIVE SEED LOCALIZATION Left 06/04/2019   Procedure: LEFT BREAST LUMPECTOMY WITH RADIOACTIVE SEED LOCALIZATION;  Surgeon: [name], MD;  Location: [place];  Service: General;  Laterality: Left;  . calcium removed from left heel    . COLONOSCOPY  2016  . HEEL SPUR SURGERY    . KNEE ARTHROSCOPY Left 04/22/2013   Procedure: LEFT KNEE ARTHROSCOPY WITH medial meniscusectomy and chondroplasty;  Surgeon: Gearlean Alf, MD;  Location: [facility];  Service: Orthopedics;  Laterality: Left;  . POLYPECTOMY    . TONSILLECTOMY    . TOTAL KNEE ARTHROPLASTY Right 02/16/2019   Procedure: TOTAL KNEE ARTHROPLASTY;  Surgeon: Gaynelle Arabian, MD;  Location: [facility];  Service: Orthopedics;  Laterality: Right;  64mn  . TUBAL LIGATION      I have reviewed the social history and family history with the patient and they are unchanged from previous note.  ALLERGIES:  has No Known Allergies.  MEDICATIONS:  Current Outpatient Medications  Medication Sig Dispense Refill  . anastrozole (ARIMIDEX) 1 MG tablet Take 1 tablet (1 mg total) by mouth daily. 90 tablet 2  . Ascorbic Acid (VITAMIN C) 1000 MG tablet Take 1,000 mg by mouth daily.    .Marland Kitchenaspirin EC 81 MG tablet Take 81 mg by mouth daily.    . calcium carbonate (OS-CAL) 600 MG TABS tablet Take 1 tablet (600 mg total) by mouth daily. 60 tablet 1  . cholecalciferol (VITAMIN D) 1000 units tablet Take 1 tablet (1,000 Units total) by mouth daily. 30 tablet 1  . esomeprazole (NEXIUM) 20 MG capsule Take 20 mg by mouth daily at 12 noon. Patient takes as needed    .  hydrochlorothiazide (HYDRODIURIL) 25 MG tablet TAKE 1 TABLET(25 MG) BY MOUTH DAILY 90 tablet 3  . Multiple Vitamin (MULTIVITAMIN) tablet Take 1 tablet by mouth daily. 30 tablet 1  . rosuvastatin (CRESTOR) 20 MG tablet Take 1 tablet (20 mg total) by mouth daily. 90 tablet 3   No current facility-administered medications for this visit.    PHYSICAL EXAMINATION: ECOG PERFORMANCE STATUS: 0 - Asymptomatic  Vitals:   07/29/20 1311  BP: (!) 152/84  Pulse: 60  Resp: 20  Temp: 98.2 F (36.8 C)  SpO2: 100%   Filed Weights   07/29/20 1311  Weight: 208 lb 8 oz (94.6 kg)    GENERAL:alert, no distress and comfortable SKIN: skin color, texture, turgor are normal, no rashes or significant lesions EYES: normal, Conjunctiva are pink and non-injected, sclera clear  NECK: supple, thyroid normal size, non-tender, without nodularity LYMPH:  no palpable lymphadenopathy in the cervical, axillary  LUNGS: clear to auscultation and percussion with normal breathing effort HEART:  regular rate & rhythm and no murmurs and no lower extremity edema ABDOMEN:abdomen soft, non-tender and normal bowel sounds Musculoskeletal:no cyanosis of digits and no clubbing  NEURO: alert & oriented x 3 with fluent speech, no focal motor/sensory deficits BREAST: S/p left lumpectomy: Surgical incision healed well with mild scar tissue with mild tenderness. Right Breast exam benign.   LABORATORY DATA:  I have reviewed the data as listed CBC Latest Ref Rng & Units 07/29/2020 03/02/2020 05/06/2019  WBC 4.0 - 10.5 K/uL 4.2 6.3 7.2  Hemoglobin 12.0 - 15.0 g/dL 13.7 13.7 14.0  Hematocrit 36 - 46 % 42.4 42.3 43.2  Platelets 150 - 400 K/uL 209 220 253     CMP Latest Ref Rng & Units 07/29/2020 03/02/2020 05/06/2019  Glucose 70 - 99 mg/dL 93 96 97  BUN 8 - 23 mg/dL _0 Creatinine 0.44 - 1.00 mg/dL 0.81 0.93 0.78  Sodium 135 - 145 mmol/L 143 146(H) 141  Potassium 3.5 - 5.1 mmol/L 3.7 3.6 3.6  Chloride 98 - 111 mmol/L 105 108  106  CO2 22 - 32 mmol/L 32 27 25  Calcium 8.9 - 10.3 mg/dL 9.7 9.4 9.4  Total Protein 6.5 - 8.1 g/dL 6.9 6.5 6.9  Total Bilirubin 0.3 - 1.2 mg/dL 0.8 0.5 0.4  Alkaline Phos 38 - 126 U/L 52 53 60  AST 15 - 41 U/L 15 14(L) 15  ALT 0 - 44 U/L _1 RADIOGRAPHIC STUDIES: I have personally reviewed the radiological images as listed and agreed with the findings in the report. No results found.   ASSESSMENT & PLAN:  TEZRA MAHR is a 69 y.o. female with    1.Ductal carcinoma in situ (DCIS) of left breast, Stage0, ER/PR+, Grade 2 -She was diagnosed in 05/2019. She underwent left lumpectomy and adjuvant radiation.  -She started antiestrogen therapy with Anastrozole in 08/2019. She has been tolerating moderately well with recent onset joint pain. If worsens will hold or switch to another AI or Tamoxifen.  -She is clinically doing well. Lab reviewed, her CBC and CMP are within normal limits. Her physical exam and her 05/2020 mammogram were unremarkable. There is no clinical concern for recurrence. -Continue surveillance. Next mammogram in 05/2021 -Continue anastrozole  -F/u in 6 months   2. Arthritis, Bone health  -She notes she had a normal DEXA scan in the last 2007. 07/2019 DEXA was normal with low T-score -0.8. Given AI can reduce bone density, will repeat DEXA every 2 years.  -Her baseline arthritis was tolerable. On Anastrozole she notes recent increase in hand and knee joint pain 11 months later. If worsens will discuss switching to another AI or Tamoxifen.  -Will monitor. I encouraged her to exercise.    3. HTN -On HCTZ and Zocor. Continue to f/u with PCP    PLAN: -Continue Anastrozole -Lab and f/u in 6 months    No problem-specific Assessment & Plan notes found for this encounter.   No orders of the defined types were placed in this encounter.  All questions were answered. The patient knows to call the clinic with any problems, questions or concerns. No  barriers to learning was detected. The total time spent in the appointment was 25 minutes.     [name], MD 07/29/2020   I, Joslyn Devon, am acting as scribe for [name], MD.   I have reviewed the above documentation for accuracy and completeness, and I agree with the above.

## 2020-07-28 ENCOUNTER — Other Ambulatory Visit: Payer: Self-pay | Admitting: Family Medicine

## 2020-07-29 ENCOUNTER — Inpatient Hospital Stay: Payer: Medicare Other | Attending: Hematology | Admitting: Hematology

## 2020-07-29 ENCOUNTER — Inpatient Hospital Stay: Payer: Medicare Other

## 2020-07-29 ENCOUNTER — Encounter: Payer: Self-pay | Admitting: Hematology

## 2020-07-29 ENCOUNTER — Other Ambulatory Visit: Payer: Self-pay

## 2020-07-29 VITALS — BP 152/84 | HR 60 | Temp 98.2°F | Resp 20 | Ht 68.0 in | Wt 208.5 lb

## 2020-07-29 DIAGNOSIS — D0512 Intraductal carcinoma in situ of left breast: Secondary | ICD-10-CM

## 2020-07-29 DIAGNOSIS — I10 Essential (primary) hypertension: Secondary | ICD-10-CM | POA: Insufficient documentation

## 2020-07-29 DIAGNOSIS — Z923 Personal history of irradiation: Secondary | ICD-10-CM | POA: Insufficient documentation

## 2020-07-29 DIAGNOSIS — M1990 Unspecified osteoarthritis, unspecified site: Secondary | ICD-10-CM | POA: Diagnosis not present

## 2020-07-29 LAB — CBC WITH DIFFERENTIAL (CANCER CENTER ONLY)
Abs Immature Granulocytes: 0 10*3/uL (ref 0.00–0.07)
Basophils Absolute: 0 10*3/uL (ref 0.0–0.1)
Basophils Relative: 1 %
Eosinophils Absolute: 0.2 10*3/uL (ref 0.0–0.5)
Eosinophils Relative: 6 %
HCT: 42.4 % (ref 36.0–46.0)
Hemoglobin: 13.7 g/dL (ref 12.0–15.0)
Immature Granulocytes: 0 %
Lymphocytes Relative: 30 %
Lymphs Abs: 1.3 10*3/uL (ref 0.7–4.0)
MCH: 28.5 pg (ref 26.0–34.0)
MCHC: 32.3 g/dL (ref 30.0–36.0)
MCV: 88.3 fL (ref 80.0–100.0)
Monocytes Absolute: 0.6 10*3/uL (ref 0.1–1.0)
Monocytes Relative: 13 %
Neutro Abs: 2.1 10*3/uL (ref 1.7–7.7)
Neutrophils Relative %: 50 %
Platelet Count: 209 10*3/uL (ref 150–400)
RBC: 4.8 MIL/uL (ref 3.87–5.11)
RDW: 13.3 % (ref 11.5–15.5)
WBC Count: 4.2 10*3/uL (ref 4.0–10.5)
nRBC: 0 % (ref 0.0–0.2)

## 2020-07-29 LAB — CMP (CANCER CENTER ONLY)
ALT: 12 U/L (ref 0–44)
AST: 15 U/L (ref 15–41)
Albumin: 3.8 g/dL (ref 3.5–5.0)
Alkaline Phosphatase: 52 U/L (ref 38–126)
Anion gap: 6 (ref 5–15)
BUN: 12 mg/dL (ref 8–23)
CO2: 32 mmol/L (ref 22–32)
Calcium: 9.7 mg/dL (ref 8.9–10.3)
Chloride: 105 mmol/L (ref 98–111)
Creatinine: 0.81 mg/dL (ref 0.44–1.00)
GFR, Estimated: >60 mL/min (ref >60)
Glucose, Bld: 93 mg/dL (ref 70–99)
Potassium: 3.7 mmol/L (ref 3.5–5.1)
Sodium: 143 mmol/L (ref 135–145)
Total Bilirubin: 0.8 mg/dL (ref 0.3–1.2)
Total Protein: 6.9 g/dL (ref 6.5–8.1)

## 2020-07-30 ENCOUNTER — Encounter: Payer: Self-pay | Admitting: Hematology

## 2020-08-01 ENCOUNTER — Telehealth: Payer: Self-pay | Admitting: Hematology

## 2020-08-01 NOTE — Telephone Encounter (Signed)
Scheduled per 11/12 los. Mailing pt appt calendar.

## 2020-09-15 ENCOUNTER — Ambulatory Visit (INDEPENDENT_AMBULATORY_CARE_PROVIDER_SITE_OTHER): Payer: Medicare Other | Admitting: Family Medicine

## 2020-09-15 ENCOUNTER — Other Ambulatory Visit: Payer: Self-pay

## 2020-09-15 ENCOUNTER — Encounter: Payer: Self-pay | Admitting: Family Medicine

## 2020-09-15 VITALS — BP 124/66 | HR 64 | Temp 97.9°F | Resp 14 | Ht 68.0 in | Wt 207.0 lb

## 2020-09-15 DIAGNOSIS — E7800 Pure hypercholesterolemia, unspecified: Secondary | ICD-10-CM | POA: Diagnosis not present

## 2020-09-15 DIAGNOSIS — M2550 Pain in unspecified joint: Secondary | ICD-10-CM | POA: Diagnosis not present

## 2020-09-15 NOTE — Progress Notes (Signed)
Subjective:    Patient ID: [name], female    DOB: 1951/05/08, 69 y.o.   MRN: [id number]  HPI She has very significant family history of cardiovascular disease in her mother, her brother, and 3 of her maternal uncles.  At her last visit, her LDL cholesterol was 105.  I was trying to drive her LDL cholesterol lower and therefore I switched her to Crestor 20 mg a day to maximize her statin with the hopes of reducing her long-term cardiovascular risk.  Patient however developed severe pain in the joints of her hands particularly the MCP joints and the PIP joints.  She was also developing some pain in her knees.  She quit Crestor and the symptoms went away almost instantly.  She resumed simvastatin 10 mg a day and was doing fine until this ran out.  However once it ran out, she went on Crestor 10 mg a day and the symptoms almost immediately returned. Past Medical History:  Diagnosis Date  . Allergy    per pt  . Anxiety   . Arthritis    knees   . Ductal carcinoma in situ of breast    left  . GERD (gastroesophageal reflux disease)   . Heart murmur    hx of in past  . Heart palpitations   . Hyperlipidemia   . Hypertension   . Microscopic hematuria   . Obesity    Past Surgical History:  Procedure Laterality Date  . BREAST LUMPECTOMY WITH RADIOACTIVE SEED LOCALIZATION Left 06/04/2019   Procedure: LEFT BREAST LUMPECTOMY WITH RADIOACTIVE SEED LOCALIZATION;  Surgeon: Griselda Miner, MD;  Location: [facility];  Service: General;  Laterality: Left;  . calcium removed from left heel    . COLONOSCOPY  2016  . HEEL SPUR SURGERY    . KNEE ARTHROSCOPY Left 04/22/2013   Procedure: LEFT KNEE ARTHROSCOPY WITH medial meniscusectomy and chondroplasty;  Surgeon: Loanne Drilling, MD;  Location: [facility];  Service: Orthopedics;  Laterality: Left;  . POLYPECTOMY    . TONSILLECTOMY    . TOTAL KNEE ARTHROPLASTY Right 02/16/2019   Procedure: TOTAL KNEE ARTHROPLASTY;  Surgeon: Ollen Gross, MD;   Location: [facility];  Service: Orthopedics;  Laterality: Right;   . TUBAL LIGATION     Current Outpatient Medications on File Prior to Visit  Medication Sig Dispense Refill  . anastrozole (ARIMIDEX) 1 MG tablet Take 1 tablet (1 mg total) by mouth daily. 90 tablet 2  . Ascorbic Acid (VITAMIN C) 1000 MG tablet Take 1,000 mg by mouth daily.    Marland Kitchen aspirin EC 81 MG tablet Take 81 mg by mouth daily.    . calcium carbonate (OS-CAL) 600 MG TABS tablet Take 1 tablet (600 mg total) by mouth daily. 60 tablet 1  . cholecalciferol (VITAMIN D) 1000 units tablet Take 1 tablet (1,000 Units total) by mouth daily. 30 tablet 1  . esomeprazole (NEXIUM) 20 MG capsule Take 20 mg by mouth daily at 12 noon. Patient takes as needed    . hydrochlorothiazide (HYDRODIURIL) 25 MG tablet TAKE 1 TABLET(25 MG) BY MOUTH DAILY 90 tablet 3  . Multiple Vitamin (MULTIVITAMIN) tablet Take 1 tablet by mouth daily. 30 tablet 1   No current facility-administered medications on file prior to visit.   No Known Allergies Social History   Socioeconomic History  . Marital status: Married    Spouse name: Not on file  . Number of children: 3  . Years of education: Not on  file  . Highest education level: Not on file  Occupational History  . Occupation: retired   Tobacco Use  . Smoking status: Former Smoker    Types: Cigarettes  . Smokeless tobacco: Never Used  . Tobacco comment: just smoked in high school  Vaping Use  . Vaping Use: Never used  Substance and Sexual Activity  . Alcohol use: Not Currently  . Drug use: No  . Sexual activity: Yes    Birth control/protection: Surgical  Other Topics Concern  . Not on file  Social History Narrative   Worked in [employer].   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       No other exercise.   Married.       Never smoked.   Social Determinants of Health   Financial Resource Strain: Not on file  Food Insecurity: Not  on file  Transportation Needs: Not on file  Physical Activity: Not on file  Stress: Not on file  Social Connections: Not on file  Intimate Partner Violence: Not on file   Family History  Problem Relation Age of Onset  . Heart disease Mother 94       MI at 73 and 50  . Cancer Father 49       Prostate Cancer  . Colon polyps Father   . Stomach cancer Father   . Hodgkin's lymphoma Brother   . Esophageal cancer Neg Hx   . Rectal cancer Neg Hx   . Colon cancer Neg Hx      Review of Systems  All other systems reviewed and are negative.      Objective:   Physical Exam Vitals reviewed.  Constitutional:      General: She is not in acute distress.    Appearance: Normal appearance. She is normal weight. She is not ill-appearing, toxic-appearing or diaphoretic.  Cardiovascular:     Rate and Rhythm: Normal rate and regular rhythm.     Pulses: Normal pulses.     Heart sounds: Normal heart sounds. No murmur heard. No friction rub. No gallop.   Pulmonary:     Effort: Pulmonary effort is normal. No respiratory distress.     Breath sounds: Normal breath sounds. No stridor. No wheezing, rhonchi or rales.  Chest:     Chest wall: No tenderness.  Abdominal:     General: Bowel sounds are normal. There is no distension.     Palpations: Abdomen is soft.     Tenderness: There is no abdominal tenderness. There is no guarding or rebound.  Musculoskeletal:     Right lower leg: No edema.     Left lower leg: No edema.  Lymphadenopathy:     Cervical: No cervical adenopathy.  Neurological:     General: No focal deficit present.     Mental Status: She is alert and oriented to person, place, and time.     Cranial Nerves: No cranial nerve deficit.     Sensory: No sensory deficit.     Coordination: Coordination normal.           Assessment & Plan:  Pure hypercholesterolemia - Plan: COMPLETE METABOLIC PANEL WITH GFR, Lipid panel  Polyarthralgia - Plan: Rheumatoid factor  Check  rheumatoid factor to ensure that she does not have rheumatoid arthritis.  Meanwhile check CMP and lipid panel.  Goal LDL cholesterol is less than 100.  Increase simvastatin to 20 mg a day.  Consider switching  to Lipitor if we are unable to drive her LDL cholesterol below 100 with simvastatin.

## 2020-09-16 LAB — LIPID PANEL
Cholesterol: 186 mg/dL (ref <200)
HDL: 61 mg/dL (ref >=50)
LDL Cholesterol (Calc): 104 mg/dL (calc) — ABNORMAL HIGH
Non-HDL Cholesterol (Calc): 125 mg/dL (calc) (ref <130)
Total CHOL/HDL Ratio: 3 (calc) (ref <5.0)
Triglycerides: 109 mg/dL (ref <150)

## 2020-09-16 LAB — RHEUMATOID FACTOR: Rheumatoid fact SerPl-aCnc: <14 IU/mL (ref <14)

## 2020-09-16 LAB — COMPLETE METABOLIC PANEL WITH GFR
AG Ratio: 2.2 (calc) (ref 1.0–2.5)
ALT: 13 U/L (ref 6–29)
AST: 17 U/L (ref 10–35)
Albumin: 4.2 g/dL (ref 3.6–5.1)
Alkaline phosphatase (APISO): 50 U/L (ref 37–153)
BUN: 19 mg/dL (ref 7–25)
CO2: 28 mmol/L (ref 20–32)
Calcium: 9.5 mg/dL (ref 8.6–10.4)
Chloride: 103 mmol/L (ref 98–110)
Creat: 0.71 mg/dL (ref 0.50–0.99)
GFR, Est African American: 102 mL/min/{1.73_m2} (ref >=60)
GFR, Est Non African American: 88 mL/min/{1.73_m2} (ref >=60)
Globulin: 1.9 g/dL (calc) (ref 1.9–3.7)
Glucose, Bld: 89 mg/dL (ref 65–99)
Potassium: 3.5 mmol/L (ref 3.5–5.3)
Sodium: 140 mmol/L (ref 135–146)
Total Bilirubin: 0.5 mg/dL (ref 0.2–1.2)
Total Protein: 6.1 g/dL (ref 6.1–8.1)

## 2020-09-16 LAB — EXTRA LAV TOP TUBE

## 2020-10-01 DIAGNOSIS — Z03818 Encounter for observation for suspected exposure to other biological agents ruled out: Secondary | ICD-10-CM | POA: Diagnosis not present

## 2020-10-01 DIAGNOSIS — Z20822 Contact with and (suspected) exposure to covid-19: Secondary | ICD-10-CM | POA: Diagnosis not present

## 2020-10-24 ENCOUNTER — Other Ambulatory Visit: Payer: Self-pay

## 2020-10-24 MED ORDER — SIMVASTATIN 20 MG PO TABS: 20.0000 mg | ORAL_TABLET | Freq: Every day | ORAL | 0 refills | Status: DC

## 2020-11-18 DIAGNOSIS — Z03818 Encounter for observation for suspected exposure to other biological agents ruled out: Secondary | ICD-10-CM | POA: Diagnosis not present

## 2020-11-18 DIAGNOSIS — Z20822 Contact with and (suspected) exposure to covid-19: Secondary | ICD-10-CM | POA: Diagnosis not present

## 2020-11-20 ENCOUNTER — Other Ambulatory Visit: Payer: Self-pay | Admitting: Nurse Practitioner

## 2020-11-20 DIAGNOSIS — D0512 Intraductal carcinoma in situ of left breast: Secondary | ICD-10-CM

## 2021-01-12 DIAGNOSIS — H33311 Horseshoe tear of retina without detachment, right eye: Secondary | ICD-10-CM | POA: Diagnosis not present

## 2021-01-13 DIAGNOSIS — H33311 Horseshoe tear of retina without detachment, right eye: Secondary | ICD-10-CM | POA: Diagnosis not present

## 2021-01-17 DIAGNOSIS — D0512 Intraductal carcinoma in situ of left breast: Secondary | ICD-10-CM | POA: Diagnosis not present

## 2021-01-26 ENCOUNTER — Encounter: Payer: Self-pay | Admitting: Hematology

## 2021-01-26 ENCOUNTER — Telehealth: Payer: Self-pay | Admitting: Hematology

## 2021-01-26 ENCOUNTER — Other Ambulatory Visit: Payer: Self-pay

## 2021-01-26 ENCOUNTER — Inpatient Hospital Stay: Payer: Medicare Other

## 2021-01-26 ENCOUNTER — Other Ambulatory Visit: Payer: Self-pay | Admitting: Family Medicine

## 2021-01-26 ENCOUNTER — Inpatient Hospital Stay: Payer: Medicare Other | Attending: Hematology | Admitting: Hematology

## 2021-01-26 VITALS — BP 146/76 | HR 56 | Temp 97.6°F | Resp 18 | Ht 69.0 in | Wt 204.0 lb

## 2021-01-26 DIAGNOSIS — D0512 Intraductal carcinoma in situ of left breast: Secondary | ICD-10-CM

## 2021-01-26 DIAGNOSIS — I10 Essential (primary) hypertension: Secondary | ICD-10-CM | POA: Diagnosis not present

## 2021-01-26 LAB — CMP (CANCER CENTER ONLY)
ALT: 12 U/L (ref 0–44)
AST: 17 U/L (ref 15–41)
Albumin: 4 g/dL (ref 3.5–5.0)
Alkaline Phosphatase: 52 U/L (ref 38–126)
Anion gap: 10 (ref 5–15)
BUN: 14 mg/dL (ref 8–23)
CO2: 30 mmol/L (ref 22–32)
Calcium: 9.8 mg/dL (ref 8.9–10.3)
Chloride: 105 mmol/L (ref 98–111)
Creatinine: 0.75 mg/dL (ref 0.44–1.00)
GFR, Estimated: >60 mL/min (ref >60)
Glucose, Bld: 94 mg/dL (ref 70–99)
Potassium: 3.6 mmol/L (ref 3.5–5.1)
Sodium: 145 mmol/L (ref 135–145)
Total Bilirubin: 0.5 mg/dL (ref 0.3–1.2)
Total Protein: 6.8 g/dL (ref 6.5–8.1)

## 2021-01-26 LAB — CBC WITH DIFFERENTIAL (CANCER CENTER ONLY)
Abs Immature Granulocytes: 0.01 10*3/uL (ref 0.00–0.07)
Basophils Absolute: 0 10*3/uL (ref 0.0–0.1)
Basophils Relative: 0 %
Eosinophils Absolute: 0.2 10*3/uL (ref 0.0–0.5)
Eosinophils Relative: 4 %
HCT: 43.5 % (ref 36.0–46.0)
Hemoglobin: 13.9 g/dL (ref 12.0–15.0)
Immature Granulocytes: 0 %
Lymphocytes Relative: 24 %
Lymphs Abs: 1.2 10*3/uL (ref 0.7–4.0)
MCH: 28.5 pg (ref 26.0–34.0)
MCHC: 32 g/dL (ref 30.0–36.0)
MCV: 89.3 fL (ref 80.0–100.0)
Monocytes Absolute: 0.6 10*3/uL (ref 0.1–1.0)
Monocytes Relative: 12 %
Neutro Abs: 2.8 10*3/uL (ref 1.7–7.7)
Neutrophils Relative %: 60 %
Platelet Count: 201 10*3/uL (ref 150–400)
RBC: 4.87 MIL/uL (ref 3.87–5.11)
RDW: 13.7 % (ref 11.5–15.5)
WBC Count: 4.8 10*3/uL (ref 4.0–10.5)
nRBC: 0 % (ref 0.0–0.2)

## 2021-01-26 NOTE — Telephone Encounter (Signed)
Scheduled follow-up appointment per 5/12 los. Patient is aware. 

## 2021-01-26 NOTE — Progress Notes (Signed)
[place]   [phone] [phone]   Clinic Follow up Note   Patient Care Team: [name], MD as PCP - General (Family Medicine) [name] as Physician Assistant (Physician Assistant) [name], RN as Oncology Nurse Navigator [name], RN as Oncology Nurse Navigator [name], MD as Consulting Physician (General Surgery) [name], MD as Consulting Physician (Hematology) [name], MD as Attending Physician (Radiation Oncology) [name], NP as Nurse Practitioner (Nurse Practitioner)  Date of Service:  01/26/2021  CHIEF COMPLAINT: f/u of left breast DCIS  SUMMARY OF ONCOLOGIC HISTORY: Oncology History Overview Note  Cancer Staging Ductal carcinoma in situ (DCIS) of left breast Staging form: Breast, AJCC 8th Edition - Clinical: Stage 0 (cTis (DCIS), cN0, cM0, ER+, PR+, HER2: Not Assessed) - Signed by [name], MD on 05/06/2019 - Pathologic: No stage assigned - Unsigned    Ductal carcinoma in situ (DCIS) of left breast  04/27/2019 Initial Biopsy   Diagnosis 04/27/19 Breast, left, needle core biopsy, lower outer calcification, 10cmfn - DUCTAL CARCINOMA IN SITU WITH CALCIFICATIONS. - SEE COMMENT.  Results: IMMUNOHISTOCHEMICAL AND MORPHOMETRIC ANALYSIS PERFORMED MANUALLY Estrogen Receptor: 100%, POSITIVE, STRONG STAINING INTENSITY Progesterone Receptor: 80%, POSITIVE, STRONG STAINING INTENSITY   04/30/2019 Initial Diagnosis   Ductal carcinoma in situ (DCIS) of left breast   05/06/2019 Cancer Staging   Staging form: Breast, AJCC 8th Edition - Clinical: Stage 0 (cTis (DCIS), cN0, cM0, ER+, PR+, HER2: Not Assessed) - Signed by [name], MD on 05/06/2019   06/04/2019 Surgery   LEFT BREAST LUMPECTOMY WITH RADIOACTIVE SEED LOCALIZATION by Dr Marlou Starks  06/04/19    06/04/2019 Pathology Results   DIAGNOSIS: 06/04/19  A. BREAST, LEFT, LUMPECTOMY:  -  Ductal carcinoma in situ, intermediate grade, 0.3 cm  -   Margins uninvolved by carcinoma (0.2 cm; medial margin)  -  Previous biopsy site changes  -  See oncology table below   B. BREAST, LEFT SUPERIOR MARGIN, EXCISION:  -  Focal residual ductal carcinoma in situ  -  Margins uninvolved by carcinoma (0.3 cm)   C. BREAST, LEFT MEDIAL MARGIN, EXCISION:  -  No residual carcinoma identified   D. BREAST, LEFT DEEP MARGIN, EXCISION:  -  No residual carcinoma identified     07/06/2019 - 07/31/2019 Radiation Therapy   Adjuvant Radiation with Dr Isidore Moos 07/06/19-07/31/19   08/2019 -  Anti-estrogen oral therapy   Anastrozole once daily starting in 08/2019   11/02/2019 Survivorship   SCP delivered by Cira Rue, NP       CURRENT THERAPY:  Anastrozole 7m once daily starting 08/2019  INTERVAL HISTORY:  CMARLAINA COBURNis here for a follow up of DCIS. She was last seen by me on 07/29/20. She presents to the clinic alone. She continues on anastrozole with good tolerance. She reports some increased hot flashes, at any time of day. They are manageable at this point. She notes she wakes up at night but is able to go back to sleep. She notes the shooting pains in her breast have subsided. She reports her joint pain has improved since she switched her cholesterol medicine.  All other systems were reviewed with the patient and are negative.  MEDICAL HISTORY:  Past Medical History:  Diagnosis Date  . Allergy    per pt  . Anxiety   . Arthritis    knees   . Ductal carcinoma in situ of breast    left  . GERD (  gastroesophageal reflux disease)   . Heart murmur    hx of in past  . Heart palpitations   . Hyperlipidemia   . Hypertension   . Microscopic hematuria   . Obesity     SURGICAL HISTORY: Past Surgical History:  Procedure Laterality Date  . BREAST LUMPECTOMY WITH RADIOACTIVE SEED LOCALIZATION Left 06/04/2019   Procedure: LEFT BREAST LUMPECTOMY WITH RADIOACTIVE SEED LOCALIZATION;  Surgeon: [name], MD;  Location: [place];  Service: General;  Laterality: Left;  . calcium removed from left heel    . COLONOSCOPY  2016  . HEEL SPUR SURGERY    . KNEE ARTHROSCOPY Left 04/22/2013   Procedure: LEFT KNEE ARTHROSCOPY WITH medial meniscusectomy and chondroplasty;  Surgeon: Gearlean Alf, MD;  Location: [facility];  Service: Orthopedics;  Laterality: Left;  . POLYPECTOMY    . TONSILLECTOMY    . TOTAL KNEE ARTHROPLASTY Right 02/16/2019   Procedure: TOTAL KNEE ARTHROPLASTY;  Surgeon: Gaynelle Arabian, MD;  Location: [facility];  Service: Orthopedics;  Laterality: Right;  77mn  . TUBAL LIGATION      I have reviewed the social history and family history with the patient and they are unchanged from previous note.  ALLERGIES:  has No Known Allergies.  MEDICATIONS:  Current Outpatient Medications  Medication Sig Dispense Refill  . anastrozole (ARIMIDEX) 1 MG tablet TAKE 1 TABLET(1 MG) BY MOUTH DAILY 90 tablet 2  . Ascorbic Acid (VITAMIN C) 1000 MG tablet Take 1,000 mg by mouth daily.    .Marland Kitchenaspirin EC 81 MG tablet Take 81 mg by mouth daily.    . calcium carbonate (OS-CAL) 600 MG TABS tablet Take 1 tablet (600 mg total) by mouth daily. 60 tablet 1  . cholecalciferol (VITAMIN D) 1000 units tablet Take 1 tablet (1,000 Units total) by mouth daily. 30 tablet 1  . esomeprazole (NEXIUM) 20 MG capsule Take 20 mg by mouth daily at 12 noon. Patient takes as needed    . hydrochlorothiazide (HYDRODIURIL) 25 MG tablet TAKE 1 TABLET(25 MG) BY MOUTH DAILY 90 tablet 3  . Multiple Vitamin (MULTIVITAMIN) tablet Take 1 tablet by mouth daily. 30 tablet 1  . simvastatin (ZOCOR) 20 MG tablet TAKE 1 TABLET(20 MG) BY MOUTH DAILY 90 tablet 0   No current facility-administered medications for this visit.    PHYSICAL EXAMINATION: ECOG PERFORMANCE STATUS: 0 - Asymptomatic  Vitals:   01/26/21 1334  BP: (!) 146/76  Pulse: (!) 56  Resp: 18  Temp: 97.6 F (36.4 C)  SpO2: 100%   Filed Weights   01/26/21 1334  Weight: 204 lb (92.5 kg)     GENERAL:alert, no distress and comfortable SKIN: skin color, texture, turgor are normal, no rashes or significant lesions EYES: normal, Conjunctiva are pink and non-injected, sclera clear  NECK: supple, thyroid normal size, non-tender, without nodularity LYMPH:  no palpable lymphadenopathy in the cervical, axillary  Musculoskeletal:no cyanosis of digits and no clubbing  NEURO: alert & oriented x 3 with fluent speech, no focal motor/sensory deficits BREASTS: WNL  LABORATORY DATA:  I have reviewed the data as listed CBC Latest Ref Rng & Units 01/26/2021 07/29/2020 03/02/2020  WBC 4.0 - 10.5 K/uL 4.8 4.2 6.3  Hemoglobin 12.0 - 15.0 g/dL 13.9 13.7 13.7  Hematocrit 36.0 - 46.0 % 43.5 42.4 42.3  Platelets 150 - 400 K/uL 201 209 220     CMP Latest Ref Rng & Units 01/26/2021 09/15/2020 07/29/2020  Glucose 70 - 99 mg/dL 94 89 93  BUN 8 - 23 mg/dL 14 19 12   Creatinine 0.44 - 1.00 mg/dL 0.75 0.71 0.81  Sodium 135 - 145 mmol/L 145 140 143  Potassium 3.5 - 5.1 mmol/L 3.6 3.5 3.7  Chloride 98 - 111 mmol/L 105 103 105  CO2 22 - 32 mmol/L 30 28 32  Calcium 8.9 - 10.3 mg/dL 9.8 9.5 9.7  Total Protein 6.5 - 8.1 g/dL 6.8 6.1 6.9  Total Bilirubin 0.3 - 1.2 mg/dL 0.5 0.5 0.8  Alkaline Phos 38 - 126 U/L 52 - 52  AST 15 - 41 U/L 17 17 15   ALT 0 - 44 U/L 12 13 12       RADIOGRAPHIC STUDIES: I have personally reviewed the radiological images as listed and agreed with the findings in the report. No results found.   ASSESSMENT & PLAN:  AALINA Pierce is a 70 y.o. female with   1.Ductal carcinoma in situ (DCIS) of left breast, Stage0, ER/PR+, Grade2 -She was diagnosed in 05/2019. She underwent left lumpectomy and adjuvant radiation.  -She started antiestrogen therapy with Anastrozole in 08/2019. She has some hot flashes and mild joint pain. Prior joint pain was related to her cholesterol medicine. -She is clinically doing well. Lab reviewed, her CBC and CMP are within normal limits. Her  physical exam and her 05/2020 mammogram were unremarkable. There is no clinical concern for recurrence. -Continue surveillance. Next mammogram in 05/2021 -She sees Dr. Marlou Starks in 6 months  -f/u with me in one year  2. Arthritis, Bone health  -She notes she had a normal DEXA scan in the last2007. 07/2019 DEXA was normal with low T-score -0.8. Given AI can reduce bone density, will repeat DEXA every 2 years.  -Her baseline arthritis was tolerable.  -Will monitor. I encouraged her to exercise.   3. HTN -On HCTZ and Zocor. Continue to f/u with PCP    PLAN: -Continue Anastrozole -Lab and f/u in one year     No problem-specific Assessment & Plan notes found for this encounter.   Orders Placed This Encounter  Procedures  . MM DIAG BREAST TOMO BILATERAL    Standing Status:   Future    Standing Expiration Date:   01/26/2022    Scheduling Instructions:     [facility]    Order Specific Question:   Reason for Exam (SYMPTOM  OR DIAGNOSIS REQUIRED)    Answer:   screening    Order Specific Question:   Preferred imaging location?    Answer:   External   All questions were answered. The patient knows to call the clinic with any problems, questions or concerns. No barriers to learning was detected. The total time spent in the appointment was 25 minutes.     [name], MD 01/26/2021   I, Wilburn Mylar, am acting as scribe for [name], MD.   I have reviewed the above documentation for accuracy and completeness, and I agree with the above.

## 2021-02-09 ENCOUNTER — Encounter: Payer: Self-pay | Admitting: Family Medicine

## 2021-02-09 ENCOUNTER — Other Ambulatory Visit: Payer: Self-pay

## 2021-02-09 ENCOUNTER — Ambulatory Visit (INDEPENDENT_AMBULATORY_CARE_PROVIDER_SITE_OTHER): Payer: Medicare Other | Admitting: Family Medicine

## 2021-02-09 VITALS — BP 142/70 | HR 72 | Temp 99.1°F | Resp 14 | Ht 69.0 in | Wt 202.0 lb

## 2021-02-09 DIAGNOSIS — E7800 Pure hypercholesterolemia, unspecified: Secondary | ICD-10-CM

## 2021-02-09 LAB — LIPID PANEL
Cholesterol: 170 mg/dL (ref <200)
HDL: 59 mg/dL (ref >=50)
LDL Cholesterol (Calc): 89 mg/dL (calc)
Non-HDL Cholesterol (Calc): 111 mg/dL (calc) (ref <130)
Total CHOL/HDL Ratio: 2.9 (calc) (ref <5.0)
Triglycerides: 127 mg/dL (ref <150)

## 2021-02-09 LAB — COMPLETE METABOLIC PANEL WITH GFR
AG Ratio: 2 (calc) (ref 1.0–2.5)
ALT: 11 U/L (ref 6–29)
AST: 13 U/L (ref 10–35)
Albumin: 4.3 g/dL (ref 3.6–5.1)
Alkaline phosphatase (APISO): 49 U/L (ref 37–153)
BUN: 12 mg/dL (ref 7–25)
CO2: 28 mmol/L (ref 20–32)
Calcium: 10 mg/dL (ref 8.6–10.4)
Chloride: 105 mmol/L (ref 98–110)
Creat: 0.76 mg/dL (ref 0.50–0.99)
GFR, Est African American: 93 mL/min/{1.73_m2} (ref >=60)
GFR, Est Non African American: 81 mL/min/{1.73_m2} (ref >=60)
Globulin: 2.1 g/dL (calc) (ref 1.9–3.7)
Glucose, Bld: 99 mg/dL (ref 65–99)
Potassium: 3.5 mmol/L (ref 3.5–5.3)
Sodium: 143 mmol/L (ref 135–146)
Total Bilirubin: 0.5 mg/dL (ref 0.2–1.2)
Total Protein: 6.4 g/dL (ref 6.1–8.1)

## 2021-02-09 LAB — CBC WITH DIFFERENTIAL/PLATELET
Absolute Monocytes: 570 cells/uL (ref 200–950)
Basophils Absolute: 30 cells/uL (ref 0–200)
Basophils Relative: 0.6 %
Eosinophils Absolute: 220 cells/uL (ref 15–500)
Eosinophils Relative: 4.4 %
HCT: 44.6 % (ref 35.0–45.0)
Hemoglobin: 14 g/dL (ref 11.7–15.5)
Lymphs Abs: 1280 cells/uL (ref 850–3900)
MCH: 27.7 pg (ref 27.0–33.0)
MCHC: 31.4 g/dL — ABNORMAL LOW (ref 32.0–36.0)
MCV: 88.1 fL (ref 80.0–100.0)
MPV: 9.8 fL (ref 7.5–12.5)
Monocytes Relative: 11.4 %
Neutro Abs: 2900 cells/uL (ref 1500–7800)
Neutrophils Relative %: 58 %
Platelets: 239 10*3/uL (ref 140–400)
RBC: 5.06 10*6/uL (ref 3.80–5.10)
RDW: 13.2 % (ref 11.0–15.0)
Total Lymphocyte: 25.6 %
WBC: 5 10*3/uL (ref 3.8–10.8)

## 2021-02-09 NOTE — Progress Notes (Signed)
Subjective:    Patient ID: [name], female    DOB: June 24, 1951, 70 y.o.   MRN: [id number]  HPI Patient is a very pleasant 70 year old Caucasian female who is here today for lab work to check her cholesterol.  However over the last week or so, she has been having episodes of dizziness.  Symptoms are somewhat vague.  She states that in all 3-4 instances, she has been walking.  She usually is standing up to start walking and then she will feel off balance.  She will feel like she is got a staggered to Catch her self.  It lasts for just a few seconds and then subsides.  She will also feel nauseated, like motion sickness.  She denies any hearing loss or new tinnitus or headaches or other neurologic deficits.  She adamantly denies near syncope or presyncope.  She denies any angina or cardiac chest pain. Past Medical History:  Diagnosis Date  . Allergy    per pt  . Anxiety   . Arthritis    knees   . Ductal carcinoma in situ of breast    left  . GERD (gastroesophageal reflux disease)   . Heart murmur    hx of in past  . Heart palpitations   . Hyperlipidemia   . Hypertension   . Microscopic hematuria   . Obesity    Past Surgical History:  Procedure Laterality Date  . BREAST LUMPECTOMY WITH RADIOACTIVE SEED LOCALIZATION Left 06/04/2019   Procedure: LEFT BREAST LUMPECTOMY WITH RADIOACTIVE SEED LOCALIZATION;  Surgeon: [name], MD;  Location: [place];  Service: General;  Laterality: Left;  . calcium removed from left heel    . COLONOSCOPY  2016  . HEEL SPUR SURGERY    . KNEE ARTHROSCOPY Left 04/22/2013   Procedure: LEFT KNEE ARTHROSCOPY WITH medial meniscusectomy and chondroplasty;  Surgeon: Gearlean Alf, MD;  Location: [facility];  Service: Orthopedics;  Laterality: Left;  . POLYPECTOMY    . TONSILLECTOMY    . TOTAL KNEE ARTHROPLASTY Right 02/16/2019   Procedure: TOTAL KNEE ARTHROPLASTY;  Surgeon: Gaynelle Arabian, MD;  Location: [facility];  Service: Orthopedics;   Laterality: Right;  86min  . TUBAL LIGATION     Current Outpatient Medications on File Prior to Visit  Medication Sig Dispense Refill  . anastrozole (ARIMIDEX) 1 MG tablet TAKE 1 TABLET(1 MG) BY MOUTH DAILY 90 tablet 2  . Ascorbic Acid (VITAMIN C) 1000 MG tablet Take 1,000 mg by mouth daily.    Marland Kitchen aspirin EC 81 MG tablet Take 81 mg by mouth daily.    . calcium carbonate (OS-CAL) 600 MG TABS tablet Take 1 tablet (600 mg total) by mouth daily. 60 tablet 1  . cholecalciferol (VITAMIN D) 1000 units tablet Take 1 tablet (1,000 Units total) by mouth daily. 30 tablet 1  . esomeprazole (NEXIUM) 20 MG capsule Take 20 mg by mouth daily at 12 noon. Patient takes as needed    . hydrochlorothiazide (HYDRODIURIL) 25 MG tablet TAKE 1 TABLET(25 MG) BY MOUTH DAILY 90 tablet 3  . Multiple Vitamin (MULTIVITAMIN) tablet Take 1 tablet by mouth daily. 30 tablet 1  . simvastatin (ZOCOR) 20 MG tablet TAKE 1 TABLET(20 MG) BY MOUTH DAILY 90 tablet 0   No current facility-administered medications on file prior to visit.   No Known Allergies Social History   Socioeconomic History  . Marital status: Married    Spouse name: Not on file  . Number of children: 3  .  Years of education: Not on file  . Highest education level: Not on file  Occupational History  . Occupation: retired   Tobacco Use  . Smoking status: Former Smoker    Types: Cigarettes  . Smokeless tobacco: Never Used  . Tobacco comment: just smoked in high school  Vaping Use  . Vaping Use: Never used  Substance and Sexual Activity  . Alcohol use: Not Currently  . Drug use: No  . Sexual activity: Yes    Birth control/protection: Surgical  Other Topics Concern  . Not on file  Social History Narrative   Worked in [employer].   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       No other exercise.   Married.       Never smoked.   Social Determinants of Health   Financial Resource Strain:  Not on file  Food Insecurity: Not on file  Transportation Needs: Not on file  Physical Activity: Not on file  Stress: Not on file  Social Connections: Not on file  Intimate Partner Violence: Not on file   Family History  Problem Relation Age of Onset  . Heart disease Mother 4       MI at 14 and 31  . Cancer Father 67       Prostate Cancer  . Colon polyps Father   . Stomach cancer Father   . Hodgkin's lymphoma Brother   . Esophageal cancer Neg Hx   . Rectal cancer Neg Hx   . Colon cancer Neg Hx      Review of Systems  All other systems reviewed and are negative.      Objective:   Physical Exam Vitals reviewed.  Constitutional:      General: She is not in acute distress.    Appearance: Normal appearance. She is normal weight. She is not ill-appearing, toxic-appearing or diaphoretic.  Cardiovascular:     Rate and Rhythm: Normal rate and regular rhythm.     Pulses: Normal pulses.     Heart sounds: Normal heart sounds. No murmur heard. No friction rub. No gallop.   Pulmonary:     Effort: Pulmonary effort is normal. No respiratory distress.     Breath sounds: Normal breath sounds. No stridor. No wheezing, rhonchi or rales.  Chest:     Chest wall: No tenderness.  Abdominal:     General: Bowel sounds are normal. There is no distension.     Palpations: Abdomen is soft.     Tenderness: There is no abdominal tenderness. There is no guarding or rebound.  Musculoskeletal:     Right lower leg: No edema.     Left lower leg: No edema.  Lymphadenopathy:     Cervical: No cervical adenopathy.  Neurological:     General: No focal deficit present.     Mental Status: She is alert and oriented to person, place, and time.     Cranial Nerves: No cranial nerve deficit.     Sensory: No sensory deficit.     Coordination: Coordination normal.     Dix-Hallpike is negative      Assessment & Plan:  Pure hypercholesterolemia - Plan: CBC with Differential/Platelet, COMPLETE  METABOLIC PANEL WITH GFR, Lipid panel  I will check CBC, CMP, lipid panel today.  Blood pressures acceptable.  I believe the patient is having benign paroxysmal positional vertigo.  For the time being we will simply monitor the situation.  If the symptoms change or she develops other neurologic deficits, we may need to proceed with an MRI of the brain given her history of breast cancer.  However the present time, I believe this is vertigo.  I do not believe that she is having orthostatic hypotension or any type of decreased cerebral perfusion

## 2021-02-10 DIAGNOSIS — H33311 Horseshoe tear of retina without detachment, right eye: Secondary | ICD-10-CM | POA: Diagnosis not present

## 2021-04-24 DIAGNOSIS — Z23 Encounter for immunization: Secondary | ICD-10-CM | POA: Diagnosis not present

## 2021-05-30 ENCOUNTER — Encounter: Payer: Self-pay | Admitting: Family Medicine

## 2021-05-30 DIAGNOSIS — Z853 Personal history of malignant neoplasm of breast: Secondary | ICD-10-CM | POA: Diagnosis not present

## 2021-06-30 ENCOUNTER — Ambulatory Visit (INDEPENDENT_AMBULATORY_CARE_PROVIDER_SITE_OTHER): Payer: Medicare Other

## 2021-06-30 ENCOUNTER — Other Ambulatory Visit: Payer: Self-pay

## 2021-06-30 ENCOUNTER — Telehealth: Payer: Self-pay

## 2021-06-30 VITALS — BP 126/72 | HR 73 | Ht 69.0 in | Wt 200.0 lb

## 2021-06-30 DIAGNOSIS — Z0000 Encounter for general adult medical examination without abnormal findings: Secondary | ICD-10-CM

## 2021-06-30 NOTE — Progress Notes (Signed)
Subjective:   [name] is a 70 y.o. female who presents for an Initial Medicare Annual Wellness Visit.  Review of Systems     Cardiac Risk Factors include: advanced age (>67men, >24 women);hypertension;sedentary lifestyle     Objective:    Today's Vitals   06/30/21 0959  BP: 126/72  Pulse: 73  SpO2: 100%  Weight: 200 lb (90.7 kg)  Height: 5\' 9"  (1.753 m)   Body mass index is 29.53 kg/m.  Advanced Directives 06/30/2021 03/02/2020 06/26/2019 06/04/2019 02/16/2019 02/10/2019 11/09/2014  Does Patient Have a Medical Advance Directive? No No No No No No No  Would patient like information on creating a medical advance directive? No - Patient declined - No - Patient declined No - Patient declined Yes (ED - Information included in AVS) Yes (ED - Information included in AVS) No - patient declined information  Pre-existing out of facility DNR order (yellow form or pink MOST form) - - - - - - -    Current Medications (verified) Outpatient Encounter Medications as of 06/30/2021  Medication Sig   anastrozole (ARIMIDEX) 1 MG tablet TAKE 1 TABLET(1 MG) BY MOUTH DAILY   Ascorbic Acid (VITAMIN C) 1000 MG tablet Take 1,000 mg by mouth daily.   aspirin EC 81 MG tablet Take 81 mg by mouth daily.   calcium carbonate (OS-CAL) 600 MG TABS tablet Take 1 tablet (600 mg total) by mouth daily.   cholecalciferol (VITAMIN D) 1000 units tablet Take 1 tablet (1,000 Units total) by mouth daily.   esomeprazole (NEXIUM) 20 MG capsule Take 20 mg by mouth daily at 12 noon. Patient takes as needed   hydrochlorothiazide (HYDRODIURIL) 25 MG tablet TAKE 1 TABLET(25 MG) BY MOUTH DAILY   Multiple Vitamin (MULTIVITAMIN) tablet Take 1 tablet by mouth daily.   simvastatin (ZOCOR) 20 MG tablet TAKE 1 TABLET(20 MG) BY MOUTH DAILY   No facility-administered encounter medications on file as of 06/30/2021.    Allergies (verified) Patient has no known allergies.   History: Past Medical History:  Diagnosis Date    Allergy    per pt   Anxiety    Arthritis    knees    Ductal carcinoma in situ of breast    left   GERD (gastroesophageal reflux disease)    Heart murmur    hx of in past   Heart palpitations    Hyperlipidemia    Hypertension    Microscopic hematuria    Obesity    Past Surgical History:  Procedure Laterality Date   BREAST LUMPECTOMY WITH RADIOACTIVE SEED LOCALIZATION Left 06/04/2019   Procedure: LEFT BREAST LUMPECTOMY WITH RADIOACTIVE SEED LOCALIZATION;  Surgeon: [name], MD;  Location: [facility];  Service: General;  Laterality: Left;   calcium removed from left heel     COLONOSCOPY  2016   HEEL SPUR SURGERY     KNEE ARTHROSCOPY Left 04/22/2013   Procedure: LEFT KNEE ARTHROSCOPY WITH medial meniscusectomy and chondroplasty;  Surgeon: Gearlean Alf, MD;  Location: [facility];  Service: Orthopedics;  Laterality: Left;   POLYPECTOMY     TONSILLECTOMY     TOTAL KNEE ARTHROPLASTY Right 02/16/2019   Procedure: TOTAL KNEE ARTHROPLASTY;  Surgeon: Gaynelle Arabian, MD;  Location: [facility];  Service: Orthopedics;  Laterality: Right;  54min   TUBAL LIGATION     Family History  Problem Relation Age of Onset   Heart disease Mother 87       MI at 56 and 11  Cancer Father 26       Prostate Cancer   Colon polyps Father    Stomach cancer Father    Hodgkin's lymphoma Brother    Esophageal cancer Neg Hx    Rectal cancer Neg Hx    Colon cancer Neg Hx    Social History   Socioeconomic History   Marital status: Married    Spouse name: Jeneen Rinks   Number of children: 3   Years of education: Not on file   Highest education level: Not on file  Occupational History   Occupation: retired   Tobacco Use   Smoking status: Former    Types: Cigarettes   Smokeless tobacco: Never   Tobacco comments:    just smoked in high school  Vaping Use   Vaping Use: Never used  Substance and Sexual Activity   Alcohol use: Not Currently   Drug use: No   Sexual activity: Yes    Birth  control/protection: Surgical  Other Topics Concern   Not on file  Social History Narrative   Worked in [employer].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 No other exercise.Married. Never smoked.   2 sons, 1 daughter, 8 grandchildren.   Social Determinants of Health   Financial Resource Strain: Low Risk    Difficulty of Paying Living Expenses: Not hard at all  Food Insecurity: No Food Insecurity   Worried About Charity fundraiser in the Last Year: Never true   [place] in the Last Year: Never true  Transportation Needs: No Transportation Needs   Lack of Transportation (Medical): No   Lack of Transportation (Non-Medical): No  Physical Activity: Inactive   Days of Exercise per Week: 0 days   Minutes of Exercise per Session: 0 min  Stress: No Stress Concern Present   Feeling of Stress : Not at all  Social Connections: Socially Integrated   Frequency of Communication with Friends and Family: More than three times a week   Frequency of Social Gatherings with Friends and Family: More than three times a week   Attends Religious Services: More than 4 times per year   Active Member of [employer] or Organizations: Yes   Attends Music therapist: More than 4 times per year   Marital Status: Married    Tobacco Counseling Counseling given: Not Answered Tobacco comments: just smoked in high school   Clinical Intake:  Pre-visit preparation completed: Yes  Pain : No/denies pain     BMI - recorded: 29.53 Nutritional Status: BMI 25 -29 Overweight Nutritional Risks: None Diabetes: No  How often do you need to have someone help you when you read instructions, pamphlets, or other written materials from your doctor or pharmacy?: 1 - Never  Diabetic?NO  Interpreter Needed?: No  Information entered by :: MJPerdue, LPn   Activities of Daily Living In your present state of health, do you have any difficulty performing  the following activities: 06/30/2021  Hearing? N  Vision? N  Difficulty concentrating or making decisions? N  Walking or climbing stairs? N  Dressing or bathing? N  Doing errands, shopping? N  Preparing Food and eating ? N  Using the Toilet? N  In the past six months, have you accidently leaked urine? N  Do you have problems with loss of bowel control? N  Managing your Medications? N  Managing your Finances? N  Housekeeping or managing your Housekeeping? N  Some recent data might be  hidden    Patient Care Team: [name], MD as PCP - General (Family Medicine) [name] as Physician Assistant (Physician Assistant) [name], RN as Oncology Nurse Navigator [name], RN as Oncology Nurse Navigator [name], MD as Consulting Physician (General Surgery) [name], MD as Consulting Physician (Hematology) [name], MD as Attending Physician (Radiation Oncology) [name], NP as Nurse Practitioner (Nurse Practitioner)  Indicate any recent Medical Services you may have received from other than [facility] providers in the past year (date may be approximate).     Assessment:   This is a routine wellness examination for [place].  Hearing/Vision screen Hearing Screening - Comments:: No hearing issues. Vision Screening - Comments:: Corrective eye surgery. [facility]. 2022  Dietary issues and exercise activities discussed: Current Exercise Habits: The patient does not participate in regular exercise at present, Exercise limited by: cardiac condition(s)   Goals Addressed             This Visit's Progress    Exercise 3x per week (30 min per time)         Depression Screen PHQ 2/9 Scores 06/30/2021 05/02/2020 04/30/2018 10/23/2017 03/25/2017 09/24/2016 03/19/2016  PHQ - 2 Score 0 0 0 0 0 0 0  PHQ- 9 Score - - - - 0 - -    Fall Risk Fall Risk  06/30/2021 05/02/2020 04/30/2018 09/24/2016  Falls in the past year? 0 0 No No  Number falls in  past yr: 0 0 - -  Injury with Fall? 0 0 - -  Risk for fall due to : No Fall Risks - - -  Follow up Falls prevention discussed - - -    FALL RISK PREVENTION PERTAINING TO THE HOME:  Any stairs in or around the home? Yes  If so, are there any without handrails? No  Home free of loose throw rugs in walkways, pet beds, electrical cords, etc? Yes  Adequate lighting in your home to reduce risk of falls? Yes   ASSISTIVE DEVICES UTILIZED TO PREVENT FALLS:  Life alert? No  Use of a cane, walker or w/c? No  Grab bars in the bathroom? No  Shower chair or bench in shower? No  Elevated toilet seat or a handicapped toilet? Yes   TIMED UP AND GO:  Was the test performed? Yes .  Length of time to ambulate 10 feet: 7 sec.  Gait steady and fast without use of assistive device  Cognitive Function:     6CIT Screen 06/30/2021  What Year? 0 points  What month? 0 points  What time? 0 points  Count back from 20 0 points  Months in reverse 0 points  Repeat phrase 0 points  Total Score 0    Immunizations Immunization History  Administered Date(s) Administered   Fluad Quad(high Dose 65+) 08/04/2019, 06/29/2020   Influenza Split 06/26/2013   Influenza Whole 06/19/2007   Influenza, High Dose Seasonal PF 06/24/2018   Influenza,inj,Quad PF,6+ Mos 07/15/2014, 07/21/2015, 07/17/2016, 07/03/2017   PFIZER(Purple Top)SARS-COV-2 Vaccination 11/15/2019, 12/07/2019, 07/14/2020   Pneumococcal Conjugate-13 07/07/2018   Pneumococcal Polysaccharide-23 08/04/2019   Td 09/18/2003, 04/25/2011   Tdap 04/25/2011   Zoster, Live 09/22/2013    TDAP status: Due, Education has been provided regarding the importance of this vaccine. Advised may receive this vaccine at local pharmacy or Health Dept. Aware to provide a copy of the vaccination record if obtained from local pharmacy or Health Dept. Verbalized acceptance and understanding.  Flu Vaccine status: Due, Education has been provided regarding the importance  of this vaccine. Advised may receive this vaccine at local pharmacy or Health Dept. Aware to provide a copy of the vaccination record if obtained from local pharmacy or Health Dept. Verbalized acceptance and understanding.  Pneumococcal vaccine status: Up to date  Covid-19 vaccine status: Completed vaccines  Qualifies for Shingles Vaccine? Yes   Zostavax completed Yes   Shingrix Completed?: No.    Education has been provided regarding the importance of this vaccine. Patient has been advised to call insurance company to determine out of pocket expense if they have not yet received this vaccine. Advised may also receive vaccine at local pharmacy or Health Dept. Verbalized acceptance and understanding.  Screening Tests Health Maintenance  Topic Date Due   Zoster Vaccines- Shingrix (1 of 2) Never done   COVID-19 Vaccine (4 - Booster for Pfizer series) 10/06/2020   INFLUENZA VACCINE  04/17/2021   TETANUS/TDAP  04/24/2021   COLONOSCOPY (Pts 45-13yrs Insurance coverage will need to be confirmed)  12/09/2022   MAMMOGRAM  05/31/2023   DEXA SCAN  Completed   Hepatitis C Screening  Completed   HPV VACCINES  Aged Out    Health Maintenance  Health Maintenance Due  Topic Date Due   Zoster Vaccines- Shingrix (1 of 2) Never done   COVID-19 Vaccine (4 - Booster for Pfizer series) 10/06/2020   INFLUENZA VACCINE  04/17/2021   TETANUS/TDAP  04/24/2021    Colorectal cancer screening: Type of screening: Colonoscopy. Completed 12/09/2019. Repeat every 3 years  Mammogram status: Completed 05/30/2021. Repeat every year  Bone Density status: Completed 08/31/2019. Results reflect: Bone density results: NORMAL. Repeat every 2 years.  Lung Cancer Screening: (Low Dose CT Chest recommended if Age 34-80 years, 30 pack-year currently smoking OR have quit w/in 15years.) does not qualify.    Additional Screening:  Hepatitis C Screening: does qualify; Completed 09/26/2017  Vision Screening: Recommended  annual ophthalmology exams for early detection of glaucoma and other disorders of the eye. Is the patient up to date with their annual eye exam?  Yes  Who is the provider or what is the name of the office in which the patient attends annual eye exams? [facility] If pt is not established with a provider, would they like to be referred to a provider to establish care? No .   Dental Screening: Recommended annual dental exams for proper oral hygiene  Community Resource Referral / Chronic Care Management: CRR required this visit?  No   CCM required this visit?  No      Plan:     I have personally reviewed and noted the following in the patient's chart:   Medical and social history Use of alcohol, tobacco or illicit drugs  Current medications and supplements including opioid prescriptions. Patient is not currently taking opioid prescriptions. Functional ability and status Nutritional status Physical activity Advanced directives List of other physicians Hospitalizations, surgeries, and ER visits in previous 12 months Vitals Screenings to include cognitive, depression, and falls Referrals and appointments  In addition, I have reviewed and discussed with patient certain preventive protocols, quality metrics, and best practice recommendations. A written personalized care plan for preventive services as well as general preventive health recommendations were provided to patient.     Chriss Driver, LPN   16/06/[id number]   Nurse Notes: Pt states she is doing well. Discussed DEXA, pt states that she will have a repeat done after she finishes the  Arimidex. [facility]. Pt states she has had recent Covid booster and will bring card to update. Pt also states she has plans to get the Shingles vaccine soon.

## 2021-06-30 NOTE — Patient Instructions (Signed)
Ms. [name] , Thank you for taking time to come for your Medicare Wellness Visit. I appreciate your ongoing commitment to your health goals. Please review the following plan we discussed and let me know if I can assist you in the future.   Screening recommendations/referrals: Colonoscopy: Done 12/09/2019 Repeat every 3 years.  Mammogram: Done 05/30/2020 Repeat annually  Bone Density: Done 08/31/2019 Repeat every 2 years  Recommended yearly ophthalmology/optometry visit for glaucoma screening and checkup Recommended yearly dental visit for hygiene and checkup  Vaccinations: Influenza vaccine: Done 06/29/2020 Repeat annually  Pneumococcal vaccine: Done 07/07/2018, 08/04/2019 Tdap vaccine: Done 04/25/2011 Repeat in 10 years  Shingles vaccine: Shingrix discussed. Please contact your pharmacy for coverage information.     Covid-19:Done 11/15/2019, 12/07/2019 and 07/14/2020.  Advanced directives: Advance directive discussed with you today. I have provided a copy for you to complete at home and have notarized. Once this is complete please bring a copy in to our office so we can scan it into your chart.   Conditions/risks identified: Aim for 30 minutes of exercise or  walking each day, drink 6-8 glasses of water and eat lots of fruits and vegetables.   Next appointment: Follow up in one year for your annual wellness visit 2023.   Preventive Care 71 Years and Older, Female Preventive care refers to lifestyle choices and visits with your health care provider that can promote health and wellness. What does preventive care include? A yearly physical exam. This is also called an annual well check. Dental exams once or twice a year. Routine eye exams. Ask your health care provider how often you should have your eyes checked. Personal lifestyle choices, including: Daily care of your teeth and gums. Regular physical activity. Eating a healthy diet. Avoiding tobacco and drug use. Limiting alcohol  use. Practicing safe sex. Taking low-dose aspirin every day. Taking vitamin and mineral supplements as recommended by your health care provider. What happens during an annual well check? The services and screenings done by your health care provider during your annual well check will depend on your age, overall health, lifestyle risk factors, and family history of disease. Counseling  Your health care provider may ask you questions about your: Alcohol use. Tobacco use. Drug use. Emotional well-being. Home and relationship well-being. Sexual activity. Eating habits. History of falls. Memory and ability to understand (cognition). Work and work Statistician. Reproductive health. Screening  You may have the following tests or measurements: Height, weight, and BMI. Blood pressure. Lipid and cholesterol levels. These may be checked every 5 years, or more frequently if you are over 44 years old. Skin check. Lung cancer screening. You may have this screening every year starting at age 2 if you have a 30-pack-year history of smoking and currently smoke or have quit within the past 15 years. Fecal occult blood test (FOBT) of the stool. You may have this test every year starting at age 63. Flexible sigmoidoscopy or colonoscopy. You may have a sigmoidoscopy every 5 years or a colonoscopy every 10 years starting at age 5. Hepatitis C blood test. Hepatitis B blood test. Sexually transmitted disease (STD) testing. Diabetes screening. This is done by checking your blood sugar (glucose) after you have not eaten for a while (fasting). You may have this done every 1-3 years. Bone density scan. This is done to screen for osteoporosis. You may have this done starting at age 61. Mammogram. This may be done every 1-2 years. Talk to your health care provider about how often  you should have regular mammograms. Talk with your health care provider about your test results, treatment options, and if necessary,  the need for more tests. Vaccines  Your health care provider may recommend certain vaccines, such as: Influenza vaccine. This is recommended every year. Tetanus, diphtheria, and acellular pertussis (Tdap, Td) vaccine. You may need a Td booster every 10 years. Zoster vaccine. You may need this after age 66. Pneumococcal 13-valent conjugate (PCV13) vaccine. One dose is recommended after age 72. Pneumococcal polysaccharide (PPSV23) vaccine. One dose is recommended after age 60. Talk to your health care provider about which screenings and vaccines you need and how often you need them. This information is not intended to replace advice given to you by your health care provider. Make sure you discuss any questions you have with your health care provider. Document Released: 09/30/2015 Document Revised: 05/23/2016 Document Reviewed: 07/05/2015 Elsevier Interactive Patient Education  2017 [place] Prevention in the Home Falls can cause injuries. They can happen to people of all ages. There are many things you can do to make your home safe and to help prevent falls. What can I do on the outside of my home? Regularly fix the edges of walkways and driveways and fix any cracks. Remove anything that might make you trip as you walk through a door, such as a raised step or threshold. Trim any bushes or trees on the path to your home. Use bright outdoor lighting. Clear any walking paths of anything that might make someone trip, such as rocks or tools. Regularly check to see if handrails are loose or broken. Make sure that both sides of any steps have handrails. Any raised decks and porches should have guardrails on the edges. Have any leaves, snow, or ice cleared regularly. Use sand or salt on walking paths during winter. Clean up any spills in your garage right away. This includes oil or grease spills. What can I do in the bathroom? Use night lights. Install grab bars by the toilet and in the  tub and shower. Do not use towel bars as grab bars. Use non-skid mats or decals in the tub or shower. If you need to sit down in the shower, use a plastic, non-slip stool. Keep the floor dry. Clean up any water that spills on the floor as soon as it happens. Remove soap buildup in the tub or shower regularly. Attach bath mats securely with double-sided non-slip rug tape. Do not have throw rugs and other things on the floor that can make you trip. What can I do in the bedroom? Use night lights. Make sure that you have a light by your bed that is easy to reach. Do not use any sheets or blankets that are too big for your bed. They should not hang down onto the floor. Have a firm chair that has side arms. You can use this for support while you get dressed. Do not have throw rugs and other things on the floor that can make you trip. What can I do in the kitchen? Clean up any spills right away. Avoid walking on wet floors. Keep items that you use a lot in easy-to-reach places. If you need to reach something above you, use a strong step stool that has a grab bar. Keep electrical cords out of the way. Do not use floor polish or wax that makes floors slippery. If you must use wax, use non-skid floor wax. Do not have throw rugs and other things on  the floor that can make you trip. What can I do with my stairs? Do not leave any items on the stairs. Make sure that there are handrails on both sides of the stairs and use them. Fix handrails that are broken or loose. Make sure that handrails are as long as the stairways. Check any carpeting to make sure that it is firmly attached to the stairs. Fix any carpet that is loose or worn. Avoid having throw rugs at the top or bottom of the stairs. If you do have throw rugs, attach them to the floor with carpet tape. Make sure that you have a light switch at the top of the stairs and the bottom of the stairs. If you do not have them, ask someone to add them for  you. What else can I do to help prevent falls? Wear shoes that: Do not have high heels. Have rubber bottoms. Are comfortable and fit you well. Are closed at the toe. Do not wear sandals. If you use a stepladder: Make sure that it is fully opened. Do not climb a closed stepladder. Make sure that both sides of the stepladder are locked into place. Ask someone to hold it for you, if possible. Clearly mark and make sure that you can see: Any grab bars or handrails. First and last steps. Where the edge of each step is. Use tools that help you move around (mobility aids) if they are needed. These include: Canes. Walkers. Scooters. Crutches. Turn on the lights when you go into a dark area. Replace any light bulbs as soon as they burn out. Set up your furniture so you have a clear path. Avoid moving your furniture around. If any of your floors are uneven, fix them. If there are any pets around you, be aware of where they are. Review your medicines with your doctor. Some medicines can make you feel dizzy. This can increase your chance of falling. Ask your doctor what other things that you can do to help prevent falls. This information is not intended to replace advice given to you by your health care provider. Make sure you discuss any questions you have with your health care provider. Document Released: 06/30/2009 Document Revised: 02/09/2016 Document Reviewed: 10/08/2014 Elsevier Interactive Patient Education  2017 [employer].

## 2021-06-30 NOTE — Telephone Encounter (Signed)
Pt in for AWV visit today. Pt states she started taking 1/2 pill of the 20 mg Simvastatin because she started having issues with joint pain, stiffness and "locking up" per patient. She wanted to make you aware. Pt asks if a 10 mg Rx can be sent in for her? Thank you.

## 2021-07-03 ENCOUNTER — Other Ambulatory Visit: Payer: Self-pay | Admitting: Family Medicine

## 2021-07-03 MED ORDER — SIMVASTATIN 10 MG PO TABS: 10.0000 mg | ORAL_TABLET | Freq: Every day | ORAL | 3 refills | Status: DC

## 2021-07-12 DIAGNOSIS — Z23 Encounter for immunization: Secondary | ICD-10-CM | POA: Diagnosis not present

## 2021-07-23 ENCOUNTER — Other Ambulatory Visit: Payer: Self-pay | Admitting: Family Medicine

## 2021-08-02 DIAGNOSIS — Z23 Encounter for immunization: Secondary | ICD-10-CM | POA: Diagnosis not present

## 2021-09-05 DIAGNOSIS — D0512 Intraductal carcinoma in situ of left breast: Secondary | ICD-10-CM | POA: Diagnosis not present

## 2021-09-09 ENCOUNTER — Other Ambulatory Visit: Payer: Self-pay | Admitting: Nurse Practitioner

## 2021-09-09 DIAGNOSIS — D0512 Intraductal carcinoma in situ of left breast: Secondary | ICD-10-CM

## 2021-10-18 ENCOUNTER — Ambulatory Visit (INDEPENDENT_AMBULATORY_CARE_PROVIDER_SITE_OTHER): Payer: Medicare Other | Admitting: Nurse Practitioner

## 2021-10-18 ENCOUNTER — Encounter: Payer: Self-pay | Admitting: Nurse Practitioner

## 2021-10-18 ENCOUNTER — Other Ambulatory Visit: Payer: Self-pay

## 2021-10-18 VITALS — BP 144/80 | HR 92 | Ht 69.0 in | Wt 206.0 lb

## 2021-10-18 DIAGNOSIS — M545 Low back pain, unspecified: Secondary | ICD-10-CM

## 2021-10-18 LAB — URINALYSIS, ROUTINE W REFLEX MICROSCOPIC
Bacteria, UA: NONE SEEN /HPF
Bilirubin Urine: NEGATIVE
Glucose, UA: NEGATIVE
Hyaline Cast: NONE SEEN /LPF
Ketones, ur: NEGATIVE
Leukocytes,Ua: NEGATIVE
Nitrite: NEGATIVE
Protein, ur: NEGATIVE
Specific Gravity, Urine: 1.015 (ref 1.001–1.035)
WBC, UA: NONE SEEN /HPF (ref 0–5)
pH: 7 (ref 5.0–8.0)

## 2021-10-18 LAB — MICROSCOPIC MESSAGE

## 2021-10-18 MED ORDER — TAMSULOSIN HCL 0.4 MG PO CAPS: 0.4000 mg | ORAL_CAPSULE | Freq: Every day | ORAL | 0 refills | Status: DC

## 2021-10-18 NOTE — Progress Notes (Signed)
Subjective:    Patient ID: [name], female    DOB: [birth year], 71 y.o.   MRN: [id number]  HPI: [name] is a 71 y.o. female presenting for back pain.  Chief Complaint  Patient presents with   Back Pain   BACK PAIN Patient reports back pain started abruptly yesterday after waking up. Duration: 1 days  Mechanism of injury: unknown Location: left mid Onset: sudden Severity: moderate  Quality: cramping - sharp pain Frequency: constant Radiation: none Aggravating factors: nothing Alleviating factors: drinking water, urinated Status: stable Treatments attempted: drinking more water Relief with NSAIDs?: No NSAIDs Taken Nighttime pain:  no Paresthesias / decreased sensation:  no Bowel / bladder incontinence:  no Fevers:  no Dysuria / urinary frequency:  no  Of note, she reports she only drinks coffee and never water.  She has been drinking more water past couple of days which has helped with back pain.  No Known Allergies  Outpatient Encounter Medications as of 10/18/2021  Medication Sig Note   anastrozole (ARIMIDEX) 1 MG tablet TAKE 1 TABLET(1 MG) BY MOUTH DAILY    Ascorbic Acid (VITAMIN C) 1000 MG tablet Take 1,000 mg by mouth daily. 07/27/2019: Not taking during RT   aspirin EC 81 MG tablet Take 81 mg by mouth daily.    calcium carbonate (OS-CAL) 600 MG TABS tablet Take 1 tablet (600 mg total) by mouth daily.    cholecalciferol (VITAMIN D) 1000 units tablet Take 1 tablet (1,000 Units total) by mouth daily.    esomeprazole (NEXIUM) 20 MG capsule Take 20 mg by mouth daily at 12 noon. Patient takes as needed    hydrochlorothiazide (HYDRODIURIL) 25 MG tablet TAKE 1 TABLET(25 MG) BY MOUTH DAILY    Multiple Vitamin (MULTIVITAMIN) tablet Take 1 tablet by mouth daily.    simvastatin (ZOCOR) 10 MG tablet Take 1 tablet (10 mg total) by mouth at bedtime.    tamsulosin (FLOMAX) 0.4 MG CAPS capsule Take 1 capsule (0.4 mg total) by mouth at bedtime.    Ubiquinol 100 MG CAPS  Take by mouth.    No facility-administered encounter medications on file as of 10/18/2021.    Patient Active Problem List   Diagnosis Date Noted   Ductal carcinoma in situ (DCIS) of left breast 04/30/2019   Osteoarthritis of right knee 02/16/2019   Hypertension    Acute medial meniscal tear 04/22/2013   Hyperlipidemia    Anxiety    Obesity    OA (osteoarthritis) of knee 04/06/2010   HEMATURIA UNSPECIFIED 04/04/2009   OVERWEIGHT 01/02/2008   MICROSCOPIC HEMATURIA 01/02/2008   PERIPHERAL EDEMA 01/02/2008   OSTEOARTHRITIS 06/02/2007   History of colonic polyps 06/02/2007   CHEST PAIN, ATYPICAL, HX OF 05/27/2007    Past Medical History:  Diagnosis Date   Allergy    per pt   Anxiety    Arthritis    knees    Ductal carcinoma in situ of breast    left   GERD (gastroesophageal reflux disease)    Heart murmur    hx of in past   Heart palpitations    Hyperlipidemia    Hypertension    Microscopic hematuria    Obesity     Relevant past medical, surgical, family and social history reviewed and updated as indicated. Interim medical history since our last visit reviewed.  Review of Systems Per HPI unless specifically indicated above     Objective:    BP (!) 144/80    Pulse 92  Ht 5\' 9"  (1.753 m)    Wt 206 lb (93.4 kg)    SpO2 99%    BMI 30.42 kg/m   Wt Readings from Last 3 Encounters:  10/18/21 206 lb (93.4 kg)  06/30/21 200 lb (90.7 kg)  02/09/21 202 lb (91.6 kg)    Physical Exam Vitals and nursing note reviewed.  Constitutional:      General: She is not in acute distress.    Appearance: Normal appearance. She is not toxic-appearing.  Abdominal:     General: Abdomen is flat.     Palpations: Abdomen is soft.     Tenderness: There is no right CVA tenderness or left CVA tenderness.  Musculoskeletal:     Lumbar back: Tenderness present. No swelling, spasms or bony tenderness. Normal range of motion. Negative right straight leg raise test and negative left straight  leg raise test.       Back:     Comments: Patient pointed to area marked as tender area.  Nontender to palpation - reports pain is more "deep"  Skin:    General: Skin is warm and dry.     Capillary Refill: Capillary refill takes less than 2 seconds.     Coloration: Skin is not jaundiced or pale.     Findings: No erythema.  Neurological:     Mental Status: She is alert and oriented to person, place, and time.     Motor: No weakness.     Gait: Gait normal.  Psychiatric:        Mood and Affect: Mood normal.        Behavior: Behavior normal.        Thought Content: Thought content normal.        Judgment: Judgment normal.      Assessment & Plan:  1. Acute left-sided low back pain without sciatica Acute.  UA today shows small amount of blood, no WBC, bacteria.  No signs or symptoms of infection today.  Presentation is atypical for kidney stones, other differential includes musculoskeletal pain.  Encouraged drinking plenty of water to help flush kidneys.  Can start Flomax at nighttime to help relax lower urinary tract.  If symptoms do not improve after 1 week, return to clinic.  If nausea/vomiting and/or severe pain develop, go to ER.  - Urinalysis, Routine w reflex microscopic - tamsulosin (FLOMAX) 0.4 MG CAPS capsule; Take 1 capsule (0.4 mg total) by mouth at bedtime.  Dispense: 20 capsule; Refill: 0     Follow up plan: Return if symptoms worsen or fail to improve.

## 2022-01-25 DIAGNOSIS — U071 COVID-19: Secondary | ICD-10-CM | POA: Diagnosis not present

## 2022-01-26 ENCOUNTER — Inpatient Hospital Stay: Payer: Medicare Other

## 2022-01-26 ENCOUNTER — Other Ambulatory Visit: Payer: Self-pay

## 2022-01-26 ENCOUNTER — Encounter: Payer: Self-pay | Admitting: Hematology

## 2022-01-26 ENCOUNTER — Inpatient Hospital Stay: Payer: Medicare Other | Attending: Hematology | Admitting: Hematology

## 2022-01-26 VITALS — BP 149/95 | HR 56 | Temp 98.6°F | Resp 18 | Ht 69.0 in | Wt 205.1 lb

## 2022-01-26 DIAGNOSIS — Z923 Personal history of irradiation: Secondary | ICD-10-CM | POA: Diagnosis not present

## 2022-01-26 DIAGNOSIS — Z79899 Other long term (current) drug therapy: Secondary | ICD-10-CM | POA: Insufficient documentation

## 2022-01-26 DIAGNOSIS — I10 Essential (primary) hypertension: Secondary | ICD-10-CM | POA: Diagnosis not present

## 2022-01-26 DIAGNOSIS — D0512 Intraductal carcinoma in situ of left breast: Secondary | ICD-10-CM | POA: Insufficient documentation

## 2022-01-26 DIAGNOSIS — E2839 Other primary ovarian failure: Secondary | ICD-10-CM

## 2022-01-26 DIAGNOSIS — Z79811 Long term (current) use of aromatase inhibitors: Secondary | ICD-10-CM | POA: Insufficient documentation

## 2022-01-26 LAB — CMP (CANCER CENTER ONLY)
ALT: 10 U/L (ref 0–44)
AST: 14 U/L — ABNORMAL LOW (ref 15–41)
Albumin: 4.1 g/dL (ref 3.5–5.0)
Alkaline Phosphatase: 42 U/L (ref 38–126)
Anion gap: 6 (ref 5–15)
BUN: 18 mg/dL (ref 8–23)
CO2: 31 mmol/L (ref 22–32)
Calcium: 9.5 mg/dL (ref 8.9–10.3)
Chloride: 104 mmol/L (ref 98–111)
Creatinine: 0.84 mg/dL (ref 0.44–1.00)
GFR, Estimated: >60 mL/min (ref >60)
Glucose, Bld: 97 mg/dL (ref 70–99)
Potassium: 3.6 mmol/L (ref 3.5–5.1)
Sodium: 141 mmol/L (ref 135–145)
Total Bilirubin: 0.5 mg/dL (ref 0.3–1.2)
Total Protein: 6.6 g/dL (ref 6.5–8.1)

## 2022-01-26 LAB — CBC WITH DIFFERENTIAL (CANCER CENTER ONLY)
Abs Immature Granulocytes: 0.01 10*3/uL (ref 0.00–0.07)
Basophils Absolute: 0 10*3/uL (ref 0.0–0.1)
Basophils Relative: 1 %
Eosinophils Absolute: 0.2 10*3/uL (ref 0.0–0.5)
Eosinophils Relative: 5 %
HCT: 41.7 % (ref 36.0–46.0)
Hemoglobin: 13.8 g/dL (ref 12.0–15.0)
Immature Granulocytes: 0 %
Lymphocytes Relative: 32 %
Lymphs Abs: 1.6 10*3/uL (ref 0.7–4.0)
MCH: 28.8 pg (ref 26.0–34.0)
MCHC: 33.1 g/dL (ref 30.0–36.0)
MCV: 86.9 fL (ref 80.0–100.0)
Monocytes Absolute: 0.6 10*3/uL (ref 0.1–1.0)
Monocytes Relative: 12 %
Neutro Abs: 2.6 10*3/uL (ref 1.7–7.7)
Neutrophils Relative %: 50 %
Platelet Count: 216 10*3/uL (ref 150–400)
RBC: 4.8 MIL/uL (ref 3.87–5.11)
RDW: 13.7 % (ref 11.5–15.5)
WBC Count: 5.1 10*3/uL (ref 4.0–10.5)
nRBC: 0 % (ref 0.0–0.2)

## 2022-01-26 MED ORDER — ANASTROZOLE 1 MG PO TABS: ORAL_TABLET | ORAL | 3 refills | Status: DC

## 2022-01-26 NOTE — Progress Notes (Signed)
?[place]   ?[phone] [phone]   ?Clinic Follow up Note  ? ?Patient Care Team: ?[name], MD as PCP - General (Family Medicine) ?Orlena Sheldon PA-C as Physician Assistant (Physician Assistant) ?[name], RN as Oncology Nurse Navigator ?[name], RN as Oncology Nurse Navigator ?[name], MD as Consulting Physician (General Surgery) ?[name], MD as Consulting Physician (Hematology) ?[name], MD as Attending Physician (Radiation Oncology) ?[name], NP as Nurse Practitioner (Nurse Practitioner) ? ?Date of Service:  01/26/2022 ? ?CHIEF COMPLAINT: f/u of left breast DCIS ? ?CURRENT THERAPY:  ?Anastrozole 69m once daily starting 08/2019 ? ?ASSESSMENT & PLAN:  ?Andrea PERSINGERis a 71y.o. post-menopausal female with  ? ?1. Left Breast DCIS, ER+/PR+, Grade 2 ?-diagnosed in 05/2019, s/p left lumpectomy and adjuvant radiation.  ?-She started antiestrogen therapy with Anastrozole in 08/2019. She has some hot flashes, no further joint pain. ?[facility] ?-She is clinically doing well. Lab reviewed, CBC and CMP are within normal limits. Her physical exam was unremarkable. There is no clinical concern for recurrence. ?-Continue surveillance. Next mammogram in 05/2022. We discussed the option of screening MRI. She is interested if her insurance will cover it. ?-plan to see Dr. TMarlou Starksfor f/u in 07/2021 ?-f/u with me in one year ?  ?2. Arthritis, Bone health  ?-07/2019 DEXA was normal with low T-score -0.8.  ?-she is aware that AI can reduce bone density, will repeat DEXA every 2 years.  ?-Her baseline arthritis was tolerable.  ?-Will monitor. ?  ?3. HTN ?-On HCTZ and Zocor. Continue to f/u with PCP  ?  ?  ?PLAN:  ?-Continue Anastrozole ?-mammogram and DEXA to be done in 05/2022 ?-Lab and f/u in one year   ? ? ?No problem-specific Assessment & Plan notes found for this encounter. ? ? ?SUMMARY OF ONCOLOGIC  HISTORY: ?Oncology History Overview Note  ?Cancer Staging ?Ductal carcinoma in situ (DCIS) of left breast ?Staging form: Breast, AJCC 8th Edition ?- Clinical: Stage 0 (cTis (DCIS), cN0, cM0, ER+, PR+, HER2: Not Assessed) - Signed by FTruitt Merle MD on 05/06/2019 ?- Pathologic: No stage assigned - Unsigned ? ?  ?Ductal carcinoma in situ (DCIS) of left breast  ?04/27/2019 Initial Biopsy  ? Diagnosis 04/27/19 ?Breast, left, needle core biopsy, lower outer calcification, 10cmfn ?- DUCTAL CARCINOMA IN SITU WITH CALCIFICATIONS. ?- SEE COMMENT. ? ?Results: ?IMMUNOHISTOCHEMICAL AND MORPHOMETRIC ANALYSIS PERFORMED MANUALLY ?Estrogen Receptor: 100%, POSITIVE, STRONG STAINING INTENSITY ?Progesterone Receptor: 80%, POSITIVE, STRONG STAINING INTENSITY ?  ?04/30/2019 Initial Diagnosis  ? Ductal carcinoma in situ (DCIS) of left breast ?  ?05/06/2019 Cancer Staging  ? Staging form: Breast, AJCC 8th Edition ?- Clinical: Stage 0 (cTis (DCIS), cN0, cM0, ER+, PR+, HER2: Not Assessed) - Signed by FTruitt Merle MD on 05/06/2019 ? ?  ?06/04/2019 Surgery  ? LEFT BREAST LUMPECTOMY WITH RADIOACTIVE SEED LOCALIZATION by Dr TMarlou Starks ?06/04/19  ?  ?06/04/2019 Pathology Results  ? DIAGNOSIS: 06/04/19 ? ?A. BREAST, LEFT, LUMPECTOMY:  ?-  Ductal carcinoma in situ, intermediate grade, 0.3 cm  ?-  Margins uninvolved by carcinoma (0.2 cm; medial margin)  ?-  Previous biopsy site changes  ?-  See oncology table below  ? ?B. BREAST, LEFT SUPERIOR MARGIN, EXCISION:  ?-  Focal residual ductal carcinoma in situ  ?-  Margins uninvolved by carcinoma (0.3 cm)  ? ?C. BREAST, LEFT MEDIAL MARGIN, EXCISION:  ?-  No residual  carcinoma identified  ? ?D. BREAST, LEFT DEEP MARGIN, EXCISION:  ?-  No residual carcinoma identified  ? ?  ?07/06/2019 - 07/31/2019 Radiation Therapy  ? Adjuvant Radiation with Dr [name] 07/06/19-07/31/19 ?  ?08/2019 -  Anti-estrogen oral therapy  ? Anastrozole once daily starting in 08/2019 ?  ?11/02/2019 Survivorship  ? SCP delivered by Andrea Rue, NP  ?   ? ? ? ?INTERVAL HISTORY:  ?[name] is here for a follow up of DCIS. She was last seen by me a year ago. She presents to the clinic alone. ?She reports she is doing well overall. She notes her breast pain has resolved. She reports she is tolerating anastrozole well with minimal/mild hot flashes, denies joint pain. ?  ?All other systems were reviewed with the patient and are negative. ? ?MEDICAL HISTORY:  ?Past Medical History:  ?Diagnosis Date  ? Allergy   ? per pt  ? Anxiety   ? Arthritis   ? knees   ? Ductal carcinoma in situ of breast   ? left  ? GERD (gastroesophageal reflux disease)   ? Heart murmur   ? hx of in past  ? Heart palpitations   ? Hyperlipidemia   ? Hypertension   ? Microscopic hematuria   ? Obesity   ? ? ?SURGICAL HISTORY: ?Past Surgical History:  ?Procedure Laterality Date  ? BREAST LUMPECTOMY WITH RADIOACTIVE SEED LOCALIZATION Left 06/04/2019  ? Procedure: LEFT BREAST LUMPECTOMY WITH RADIOACTIVE SEED LOCALIZATION;  Surgeon: [name], MD;  Location: [place];  Service: General;  Laterality: Left;  ? calcium removed from left heel    ? COLONOSCOPY  2016  ? HEEL SPUR SURGERY    ? KNEE ARTHROSCOPY Left 04/22/2013  ? Procedure: LEFT KNEE ARTHROSCOPY WITH medial meniscusectomy and chondroplasty;  Surgeon: Gearlean Alf, MD;  Location: [facility];  Service: Orthopedics;  Laterality: Left;  ? POLYPECTOMY    ? TONSILLECTOMY    ? TOTAL KNEE ARTHROPLASTY Right 02/16/2019  ? Procedure: TOTAL KNEE ARTHROPLASTY;  Surgeon: Gaynelle Arabian, MD;  Location: [facility];  Service: Orthopedics;  Laterality: Right;  96mn  ? TUBAL LIGATION    ? ? ?I have reviewed the social history and family history with the patient and they are unchanged from previous note. ? ?ALLERGIES:  has No Known Allergies. ? ?MEDICATIONS:  ?Current Outpatient Medications  ?Medication Sig Dispense Refill  ? anastrozole (ARIMIDEX) 1 MG tablet TAKE 1 TABLET(1 MG) BY MOUTH DAILY 90 tablet 3  ? Ascorbic Acid (VITAMIN C) 1000 MG  tablet Take 1,000 mg by mouth daily.    ? aspirin EC 81 MG tablet Take 81 mg by mouth daily.    ? calcium carbonate (OS-CAL) 600 MG TABS tablet Take 1 tablet (600 mg total) by mouth daily. 60 tablet 1  ? cholecalciferol (VITAMIN D) 1000 units tablet Take 1 tablet (1,000 Units total) by mouth daily. 30 tablet 1  ? esomeprazole (NEXIUM) 20 MG capsule Take 20 mg by mouth daily at 12 noon. Patient takes as needed    ? hydrochlorothiazide (HYDRODIURIL) 25 MG tablet TAKE 1 TABLET(25 MG) BY MOUTH DAILY 90 tablet 3  ? Multiple Vitamin (MULTIVITAMIN) tablet Take 1 tablet by mouth daily. 30 tablet 1  ? simvastatin (ZOCOR) 10 MG tablet Take 1 tablet (10 mg total) by mouth at bedtime. 90 tablet 3  ? Ubiquinol 100 MG CAPS Take by mouth.    ? ?No current facility-administered medications for this visit.  ? ? ?PHYSICAL  EXAMINATION: ?ECOG PERFORMANCE STATUS: 0 - Asymptomatic ? ?Vitals:  ? 01/26/22 1152  ?BP: (!) 149/95  ?Pulse: (!) 56  ?Resp: 18  ?Temp: 98.6 ?F (37 ?C)  ?SpO2: 100%  ? ?Wt Readings from Last 3 Encounters:  ?01/26/22 205 lb 1.6 oz (93 kg)  ?10/18/21 206 lb (93.4 kg)  ?06/30/21 200 lb (90.7 kg)  ?  ? ?GENERAL:alert, no distress and comfortable ?SKIN: skin color, texture, turgor are normal, no rashes or significant lesions ?EYES: normal, Conjunctiva are pink and non-injected, sclera clear  ?NECK: supple, thyroid normal size, non-tender, without nodularity ?LYMPH:  no palpable lymphadenopathy in the cervical, axillary ?LUNGS: clear to auscultation and percussion with normal breathing effort ?HEART: regular rate & rhythm and no murmurs and no lower extremity edema ?ABDOMEN:abdomen soft, non-tender and normal bowel sounds ?Musculoskeletal:no cyanosis of digits and no clubbing  ?NEURO: alert & oriented x 3 with fluent speech, no focal motor/sensory deficits ?BREAST: No palpable mass, nodules or adenopathy bilaterally. Breast exam benign.  ? ?LABORATORY DATA:  ?I have reviewed the data as listed ? ?  Latest Ref Rng & Units  01/26/2022  ? 11:29 AM 02/09/2021  ? 12:11 PM 01/26/2021  ?  1:19 PM  ?CBC  ?WBC 4.0 - 10.5 K/uL 5.1   5.0   4.8    ?Hemoglobin 12.0 - 15.0 g/dL 13.8   14.0   13.9    ?Hematocrit 36.0 - 46.0 % 41.7   44.6

## 2022-01-31 ENCOUNTER — Telehealth: Payer: Self-pay | Admitting: Hematology

## 2022-01-31 NOTE — Telephone Encounter (Signed)
Left message with follow-up appointment per 5/12 los. ?

## 2022-02-08 DIAGNOSIS — H2513 Age-related nuclear cataract, bilateral: Secondary | ICD-10-CM | POA: Diagnosis not present

## 2022-06-12 ENCOUNTER — Telehealth: Payer: Self-pay

## 2022-06-12 NOTE — Telephone Encounter (Signed)
Opened in error

## 2022-06-21 DIAGNOSIS — Z853 Personal history of malignant neoplasm of breast: Secondary | ICD-10-CM | POA: Diagnosis not present

## 2022-06-21 DIAGNOSIS — Z780 Asymptomatic menopausal state: Secondary | ICD-10-CM | POA: Diagnosis not present

## 2022-06-21 DIAGNOSIS — Z1231 Encounter for screening mammogram for malignant neoplasm of breast: Secondary | ICD-10-CM | POA: Diagnosis not present

## 2022-06-22 LAB — HM MAMMOGRAPHY

## 2022-06-25 ENCOUNTER — Encounter: Payer: Self-pay | Admitting: Hematology

## 2022-07-16 ENCOUNTER — Encounter: Payer: Self-pay | Admitting: Family Medicine

## 2022-07-16 ENCOUNTER — Other Ambulatory Visit: Payer: Self-pay | Admitting: Family Medicine

## 2022-07-18 ENCOUNTER — Other Ambulatory Visit: Payer: Self-pay | Admitting: Family Medicine

## 2022-07-18 DIAGNOSIS — Z23 Encounter for immunization: Secondary | ICD-10-CM | POA: Diagnosis not present

## 2022-07-18 NOTE — Telephone Encounter (Signed)
Patient has future OV scheduled 07/30/22, will refill.  Requested Prescriptions  Pending Prescriptions Disp Refills  . hydrochlorothiazide (HYDRODIURIL) 25 MG tablet [Pharmacy Med Name: HYDROCHLOROTHIAZIDE '25MG'$  TABLETS] 90 tablet 0    Sig: TAKE 1 TABLET(25 MG) BY MOUTH DAILY     Cardiovascular: Diuretics - Thiazide Failed - 07/18/2022  6:22 AM      Failed - Last BP in normal range    BP Readings from Last 1 Encounters:  01/26/22 (!) 149/95         Failed - Valid encounter within last 6 months    Recent Outpatient Visits          9 months ago Acute left-sided low back pain without sciatica   [place] Eulogio Bear, NP   1 year ago Pure hypercholesterolemia   [place] Pick[place], Cammie Mcgee, MD   1 year ago Pure hypercholesterolemia   [place] Dennard Schaumann, Cammie Mcgee, MD   2 years ago Cervical cancer screening   [place] Dennard Schaumann, Cammie Mcgee, MD   3 years ago Essential hypertension   [place], Cammie Mcgee, MD      Future Appointments            In 1 week Dennard Schaumann, Cammie Mcgee, MD [place]           Passed - Cr in normal range and within 180 days    Creatinine  Date Value Ref Range Status  01/26/2022 0.84 0.44 - 1.00 mg/dL Final   Creat  Date Value Ref Range Status  02/09/2021 0.76 0.50 - 0.99 mg/dL Final    Comment:    For patients >33 years of age, the reference limit for Creatinine is approximately 13% higher for people identified as African-American. .          Passed - K in normal range and within 180 days    Potassium  Date Value Ref Range Status  01/26/2022 3.6 3.5 - 5.1 mmol/L Final         Passed - Na in normal range and within 180 days    Sodium  Date Value Ref Range Status  01/26/2022 141 135 - 145 mmol/L Final

## 2022-07-30 ENCOUNTER — Ambulatory Visit (INDEPENDENT_AMBULATORY_CARE_PROVIDER_SITE_OTHER): Payer: Medicare Other | Admitting: Family Medicine

## 2022-07-30 ENCOUNTER — Encounter: Payer: Self-pay | Admitting: Family Medicine

## 2022-07-30 VITALS — BP 120/80 | HR 64 | Ht 69.0 in | Wt 198.0 lb

## 2022-07-30 DIAGNOSIS — E7800 Pure hypercholesterolemia, unspecified: Secondary | ICD-10-CM | POA: Diagnosis not present

## 2022-07-30 LAB — COMPLETE METABOLIC PANEL WITH GFR
AG Ratio: 1.8 (calc) (ref 1.0–2.5)
ALT: 10 U/L (ref 6–29)
AST: 16 U/L (ref 10–35)
Albumin: 4.3 g/dL (ref 3.6–5.1)
Alkaline phosphatase (APISO): 48 U/L (ref 37–153)
BUN: 15 mg/dL (ref 7–25)
CO2: 30 mmol/L (ref 20–32)
Calcium: 9.9 mg/dL (ref 8.6–10.4)
Chloride: 104 mmol/L (ref 98–110)
Creat: 0.79 mg/dL (ref 0.50–1.05)
Globulin: 2.4 g/dL (calc) (ref 1.9–3.7)
Glucose, Bld: 91 mg/dL (ref 65–99)
Potassium: 3.9 mmol/L (ref 3.5–5.3)
Sodium: 143 mmol/L (ref 135–146)
Total Bilirubin: 0.5 mg/dL (ref 0.2–1.2)
Total Protein: 6.7 g/dL (ref 6.1–8.1)
eGFR: 81 mL/min/{1.73_m2} (ref >=60)

## 2022-07-30 LAB — CBC WITH DIFFERENTIAL/PLATELET
Absolute Monocytes: 503 cells/uL (ref 200–950)
Basophils Absolute: 42 cells/uL (ref 0–200)
Basophils Relative: 0.9 %
Eosinophils Absolute: 230 cells/uL (ref 15–500)
Eosinophils Relative: 4.9 %
HCT: 43.5 % (ref 35.0–45.0)
Hemoglobin: 14.4 g/dL (ref 11.7–15.5)
Lymphs Abs: 1457 cells/uL (ref 850–3900)
MCH: 29 pg (ref 27.0–33.0)
MCHC: 33.1 g/dL (ref 32.0–36.0)
MCV: 87.5 fL (ref 80.0–100.0)
MPV: 10.3 fL (ref 7.5–12.5)
Monocytes Relative: 10.7 %
Neutro Abs: 2468 cells/uL (ref 1500–7800)
Neutrophils Relative %: 52.5 %
Platelets: 238 10*3/uL (ref 140–400)
RBC: 4.97 10*6/uL (ref 3.80–5.10)
RDW: 13.2 % (ref 11.0–15.0)
Total Lymphocyte: 31 %
WBC: 4.7 10*3/uL (ref 3.8–10.8)

## 2022-07-30 LAB — LIPID PANEL
Cholesterol: 198 mg/dL (ref <200)
HDL: 66 mg/dL (ref >=50)
LDL Cholesterol (Calc): 113 mg/dL (calc) — ABNORMAL HIGH
Non-HDL Cholesterol (Calc): 132 mg/dL (calc) — ABNORMAL HIGH (ref <130)
Total CHOL/HDL Ratio: 3 (calc) (ref <5.0)
Triglycerides: 90 mg/dL (ref <150)

## 2022-07-30 NOTE — Progress Notes (Signed)
Subjective:    Patient ID: [name], female    DOB: [birth year], 71 y.o.   MRN: [id number]  HPI Patient is a very pleasant 71 year old Caucasian female who is here today for a checkup.  She has a history of hypertension but her blood pressure today is outstanding at 120/80.  She denies any chest pain shortness of breath or dyspnea on exertion.  She has had her shingles vaccine, her flu shot, both at an outside pharmacy.  She is due for COVID booster as well as RSV.  We discussed this and I recommended a COVID booster.  RSV I will leave to her discretion.  Her mammogram and colonoscopy are up-to-date.  She just had a bone density test earlier this year.  She is concerned about possible memory loss due to statins.  She denies any memory loss specifically.  She denies any falls or depression. Past Medical History:  Diagnosis Date   Allergy    per pt   Anxiety    Arthritis    knees    Ductal carcinoma in situ of breast    left   GERD (gastroesophageal reflux disease)    Heart murmur    hx of in past   Heart palpitations    Hyperlipidemia    Hypertension    Microscopic hematuria    Obesity    Past Surgical History:  Procedure Laterality Date   BREAST LUMPECTOMY WITH RADIOACTIVE SEED LOCALIZATION Left 06/04/2019   Procedure: LEFT BREAST LUMPECTOMY WITH RADIOACTIVE SEED LOCALIZATION;  Surgeon: [name], MD;  Location: [place];  Service: General;  Laterality: Left;   calcium removed from left heel     COLONOSCOPY  2016   HEEL SPUR SURGERY     KNEE ARTHROSCOPY Left 04/22/2013   Procedure: LEFT KNEE ARTHROSCOPY WITH medial meniscusectomy and chondroplasty;  Surgeon: Gearlean Alf, MD;  Location: [facility];  Service: Orthopedics;  Laterality: Left;   POLYPECTOMY     TONSILLECTOMY     TOTAL KNEE ARTHROPLASTY Right 02/16/2019   Procedure: TOTAL KNEE ARTHROPLASTY;  Surgeon: Gaynelle Arabian, MD;  Location: [facility];  Service: Orthopedics;  Laterality: Right;  70mn   TUBAL  LIGATION     Current Outpatient Medications on File Prior to Visit  Medication Sig Dispense Refill   anastrozole (ARIMIDEX) 1 MG tablet TAKE 1 TABLET(1 MG) BY MOUTH DAILY 90 tablet 3   Ascorbic Acid (VITAMIN C) 1000 MG tablet Take 1,000 mg by mouth daily.     aspirin EC 81 MG tablet Take 81 mg by mouth daily.     calcium carbonate (OS-CAL) 600 MG TABS tablet Take 1 tablet (600 mg total) by mouth daily. 60 tablet 1   cholecalciferol (VITAMIN D) 1000 units tablet Take 1 tablet (1,000 Units total) by mouth daily. 30 tablet 1   co-enzyme Q-10 30 MG capsule Take 30 mg by mouth 3 (three) times daily.     esomeprazole (NEXIUM) 20 MG capsule Take 20 mg by mouth daily at 12 noon. Patient takes as needed     hydrochlorothiazide (HYDRODIURIL) 25 MG tablet TAKE 1 TABLET(25 MG) BY MOUTH DAILY 90 tablet 0   Multiple Vitamin (MULTIVITAMIN) tablet Take 1 tablet by mouth daily. 30 tablet 1   simvastatin (ZOCOR) 10 MG tablet Take 1 tablet (10 mg total) by mouth at bedtime. 90 tablet 3   No current facility-administered medications on file prior to visit.   No Known Allergies Social History   Socioeconomic History  Marital status: Married    Spouse name: Jeneen Rinks   Number of children: 3   Years of education: Not on file   Highest education level: Not on file  Occupational History   Occupation: retired   Tobacco Use   Smoking status: Former    Types: Cigarettes   Smokeless tobacco: Never   Tobacco comments:    just smoked in high school  Vaping Use   Vaping Use: Never used  Substance and Sexual Activity   Alcohol use: Not Currently   Drug use: No   Sexual activity: Yes    Birth control/protection: Surgical  Other Topics Concern   Not on file  Social History Narrative   Worked in [employer].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 No other exercise.Married. Never smoked.   2 sons, 1 daughter, 8 grandchildren.   Social Determinants of Health    Financial Resource Strain: Low Risk  (06/30/2021)   Overall Financial Resource Strain (CARDIA)    Difficulty of Paying Living Expenses: Not hard at all  Food Insecurity: No Food Insecurity (06/30/2021)   Hunger Vital Sign    Worried About Running Out of Food in the Last Year: Never true    Ran Out of Food in the Last Year: Never true  Transportation Needs: No Transportation Needs (06/30/2021)   PRAPARE - Hydrologist (Medical): No    Lack of Transportation (Non-Medical): No  Physical Activity: Inactive (06/30/2021)   Exercise Vital Sign    Days of Exercise per Week: 0 days    Minutes of Exercise per Session: 0 min  Stress: No Stress Concern Present (06/30/2021)   Wise    Feeling of Stress : Not at all  Social Connections: [place] (06/30/2021)   Social Connection and Isolation Panel [NHANES]    Frequency of Communication with Friends and Family: More than three times a week    Frequency of Social Gatherings with Friends and Family: More than three times a week    Attends Religious Services: More than 4 times per year    Active Member of [employer] or Organizations: Yes    Attends Music therapist: More than 4 times per year    Marital Status: Married  Human resources officer Violence: Not At Risk (06/30/2021)   Humiliation, Afraid, Rape, and Kick questionnaire    Fear of Current or Ex-Partner: No    Emotionally Abused: No    Physically Abused: No    Sexually Abused: No   Family History  Problem Relation Age of Onset   Heart disease Mother 56       MI at 69 and 38   Cancer Father 94       Prostate Cancer   Colon polyps Father    Stomach cancer Father    Hodgkin's lymphoma Brother    Esophageal cancer Neg Hx    Rectal cancer Neg Hx    Colon cancer Neg Hx      Review of Systems  All other systems reviewed and are negative.      Objective:   Physical  Exam Vitals reviewed.  Constitutional:      General: She is not in acute distress.    Appearance: Normal appearance. She is normal weight. She is not ill-appearing, toxic-appearing or diaphoretic.  Cardiovascular:     Rate and Rhythm: Normal rate and regular rhythm.  Pulses: Normal pulses.     Heart sounds: Normal heart sounds. No murmur heard.    No friction rub. No gallop.  Pulmonary:     Effort: Pulmonary effort is normal. No respiratory distress.     Breath sounds: Normal breath sounds. No stridor. No wheezing, rhonchi or rales.  Chest:     Chest wall: No tenderness.  Abdominal:     General: Bowel sounds are normal. There is no distension.     Palpations: Abdomen is soft.     Tenderness: There is no abdominal tenderness. There is no guarding or rebound.  Musculoskeletal:     Right lower leg: No edema.     Left lower leg: No edema.  Lymphadenopathy:     Cervical: No cervical adenopathy.  Neurological:     General: No focal deficit present.     Mental Status: She is alert and oriented to person, place, and time.     Cranial Nerves: No cranial nerve deficit.     Sensory: No sensory deficit.     Coordination: Coordination normal.        Assessment & Plan:  Pure hypercholesterolemia - Plan: CBC with Differential/Platelet, Lipid panel, COMPLETE METABOLIC PANEL WITH GFR I am very happy with her blood pressure.  I will check a CBC a CMP and lipid panel.  If her LDL cholesterol is well below 100, we may try stopping the simvastatin.  Recommended a COVID booster and also discussed the positives of an RSV vaccination.  The remainder of her preventative care is up-to-date

## 2022-09-03 ENCOUNTER — Other Ambulatory Visit: Payer: Self-pay

## 2022-09-03 MED ORDER — SIMVASTATIN 10 MG PO TABS: 10.0000 mg | ORAL_TABLET | Freq: Every day | ORAL | 3 refills | Status: DC

## 2022-09-11 ENCOUNTER — Ambulatory Visit (INDEPENDENT_AMBULATORY_CARE_PROVIDER_SITE_OTHER): Payer: Medicare Other

## 2022-09-11 VITALS — Ht 69.0 in | Wt 183.0 lb

## 2022-09-11 DIAGNOSIS — Z0000 Encounter for general adult medical examination without abnormal findings: Secondary | ICD-10-CM

## 2022-09-11 MED ORDER — HYDROCHLOROTHIAZIDE 25 MG PO TABS: ORAL_TABLET | ORAL | 1 refills | Status: DC

## 2022-09-11 NOTE — Patient Instructions (Signed)
[name] , Thank you for taking time to come for your Medicare Wellness Visit. I appreciate your ongoing commitment to your health goals. Please review the following plan we discussed and let me know if I can assist you in the future.   These are the goals we discussed:  Goals      Exercise 3x per week (30 min per time)        This is a list of the screening recommended for you and due dates:  Health Maintenance  Topic Date Due   COVID-19 Vaccine (6 - 2023-24 season) 05/18/2022   Medicare Annual Wellness Visit  06/30/2022   Colon Cancer Screening  12/09/2022   Mammogram  06/23/2023   DTaP/Tdap/Td vaccine (6 - Td or Tdap) 03/14/2032   Pneumonia Vaccine  Completed   Flu Shot  Completed   DEXA scan (bone density measurement)  Completed   Hepatitis C Screening: USPSTF Recommendation to screen - Ages 7-79 yo.  Completed   Zoster (Shingles) Vaccine  Completed   HPV Vaccine  Aged Out    Advanced directives: Please bring a copy of your health care power of attorney and living will to the office to be added to your chart at your convenience.   Conditions/risks identified: Aim for 30 minutes of exercise or brisk walking, 6-8 glasses of water, and 5 servings of fruits and vegetables each day.   Next appointment: Follow up in one year for your annual wellness visit    Preventive Care 65 Years and Older, Female Preventive care refers to lifestyle choices and visits with your health care provider that can promote health and wellness. What does preventive care include? A yearly physical exam. This is also called an annual well check. Dental exams once or twice a year. Routine eye exams. Ask your health care provider how often you should have your eyes checked. Personal lifestyle choices, including: Daily care of your teeth and gums. Regular physical activity. Eating a healthy diet. Avoiding tobacco and drug use. Limiting alcohol use. Practicing safe sex. Taking low-dose aspirin every  day. Taking vitamin and mineral supplements as recommended by your health care provider. What happens during an annual well check? The services and screenings done by your health care provider during your annual well check will depend on your age, overall health, lifestyle risk factors, and family history of disease. Counseling  Your health care provider may ask you questions about your: Alcohol use. Tobacco use. Drug use. Emotional well-being. Home and relationship well-being. Sexual activity. Eating habits. History of falls. Memory and ability to understand (cognition). Work and work Statistician. Reproductive health. Screening  You may have the following tests or measurements: Height, weight, and BMI. Blood pressure. Lipid and cholesterol levels. These may be checked every 5 years, or more frequently if you are over 33 years old. Skin check. Lung cancer screening. You may have this screening every year starting at age 75 if you have a 30-pack-year history of smoking and currently smoke or have quit within the past 15 years. Fecal occult blood test (FOBT) of the stool. You may have this test every year starting at age 48. Flexible sigmoidoscopy or colonoscopy. You may have a sigmoidoscopy every 5 years or a colonoscopy every 10 years starting at age 1. Hepatitis C blood test. Hepatitis B blood test. Sexually transmitted disease (STD) testing. Diabetes screening. This is done by checking your blood sugar (glucose) after you have not eaten for a while (fasting). You may have this done  every 1-3 years. Bone density scan. This is done to screen for osteoporosis. You may have this done starting at age 41. Mammogram. This may be done every 1-2 years. Talk to your health care provider about how often you should have regular mammograms. Talk with your health care provider about your test results, treatment options, and if necessary, the need for more tests. Vaccines  Your health care  provider may recommend certain vaccines, such as: Influenza vaccine. This is recommended every year. Tetanus, diphtheria, and acellular pertussis (Tdap, Td) vaccine. You may need a Td booster every 10 years. Zoster vaccine. You may need this after age 7. Pneumococcal 13-valent conjugate (PCV13) vaccine. One dose is recommended after age 16. Pneumococcal polysaccharide (PPSV23) vaccine. One dose is recommended after age 75. Talk to your health care provider about which screenings and vaccines you need and how often you need them. This information is not intended to replace advice given to you by your health care provider. Make sure you discuss any questions you have with your health care provider. Document Released: 09/30/2015 Document Revised: 05/23/2016 Document Reviewed: 07/05/2015 Elsevier Interactive Patient Education  2017 [place] Prevention in the Home Falls can cause injuries. They can happen to people of all ages. There are many things you can do to make your home safe and to help prevent falls. What can I do on the outside of my home? Regularly fix the edges of walkways and driveways and fix any cracks. Remove anything that might make you trip as you walk through a door, such as a raised step or threshold. Trim any bushes or trees on the path to your home. Use bright outdoor lighting. Clear any walking paths of anything that might make someone trip, such as rocks or tools. Regularly check to see if handrails are loose or broken. Make sure that both sides of any steps have handrails. Any raised decks and porches should have guardrails on the edges. Have any leaves, snow, or ice cleared regularly. Use sand or salt on walking paths during winter. Clean up any spills in your garage right away. This includes oil or grease spills. What can I do in the bathroom? Use night lights. Install grab bars by the toilet and in the tub and shower. Do not use towel bars as grab  bars. Use non-skid mats or decals in the tub or shower. If you need to sit down in the shower, use a plastic, non-slip stool. Keep the floor dry. Clean up any water that spills on the floor as soon as it happens. Remove soap buildup in the tub or shower regularly. Attach bath mats securely with double-sided non-slip rug tape. Do not have throw rugs and other things on the floor that can make you trip. What can I do in the bedroom? Use night lights. Make sure that you have a light by your bed that is easy to reach. Do not use any sheets or blankets that are too big for your bed. They should not hang down onto the floor. Have a firm chair that has side arms. You can use this for support while you get dressed. Do not have throw rugs and other things on the floor that can make you trip. What can I do in the kitchen? Clean up any spills right away. Avoid walking on wet floors. Keep items that you use a lot in easy-to-reach places. If you need to reach something above you, use a strong step stool that has  a grab bar. Keep electrical cords out of the way. Do not use floor polish or wax that makes floors slippery. If you must use wax, use non-skid floor wax. Do not have throw rugs and other things on the floor that can make you trip. What can I do with my stairs? Do not leave any items on the stairs. Make sure that there are handrails on both sides of the stairs and use them. Fix handrails that are broken or loose. Make sure that handrails are as long as the stairways. Check any carpeting to make sure that it is firmly attached to the stairs. Fix any carpet that is loose or worn. Avoid having throw rugs at the top or bottom of the stairs. If you do have throw rugs, attach them to the floor with carpet tape. Make sure that you have a light switch at the top of the stairs and the bottom of the stairs. If you do not have them, ask someone to add them for you. What else can I do to help prevent  falls? Wear shoes that: Do not have high heels. Have rubber bottoms. Are comfortable and fit you well. Are closed at the toe. Do not wear sandals. If you use a stepladder: Make sure that it is fully opened. Do not climb a closed stepladder. Make sure that both sides of the stepladder are locked into place. Ask someone to hold it for you, if possible. Clearly mark and make sure that you can see: Any grab bars or handrails. First and last steps. Where the edge of each step is. Use tools that help you move around (mobility aids) if they are needed. These include: Canes. Walkers. Scooters. Crutches. Turn on the lights when you go into a dark area. Replace any light bulbs as soon as they burn out. Set up your furniture so you have a clear path. Avoid moving your furniture around. If any of your floors are uneven, fix them. If there are any pets around you, be aware of where they are. Review your medicines with your doctor. Some medicines can make you feel dizzy. This can increase your chance of falling. Ask your doctor what other things that you can do to help prevent falls. This information is not intended to replace advice given to you by your health care provider. Make sure you discuss any questions you have with your health care provider. Document Released: 06/30/2009 Document Revised: 02/09/2016 Document Reviewed: 10/08/2014 Elsevier Interactive Patient Education  2017 [employer].

## 2022-09-11 NOTE — Progress Notes (Signed)
Subjective:   [name] is a 71 y.o. female who presents for Medicare Annual (Subsequent) preventive examination.  I connected with  Marcelline Mates on 09/11/22 by a audio enabled telemedicine application and verified that I am speaking with the correct person using two identifiers.  Patient Location: Home  Provider Location: Office/Clinic  I discussed the limitations of evaluation and management by telemedicine. The patient expressed understanding and agreed to proceed.  Review of Systems     Cardiac Risk Factors include: advanced age (>39mn, >>52women);hypertension     Objective:    Today's Vitals   09/11/22 1449  Weight: 183 lb (83 kg)  Height: '5\' 9"'$  (1.753 m)   Body mass index is 27.02 kg/m.     09/11/2022    3:00 PM 06/30/2021   10:12 AM 03/02/2020    3:36 PM 06/26/2019    8:33 AM 06/04/2019    6:46 AM 02/16/2019    2:33 PM 02/10/2019    2:53 PM  Advanced Directives  Does Patient Have a Medical Advance Directive? No No No No No No No  Would patient like information on creating a medical advance directive? Yes (MAU/Ambulatory/Procedural Areas - Information given) No - Patient declined  No - Patient declined No - Patient declined Yes (ED - Information included in AVS) Yes (ED - Information included in AVS)    Current Medications (verified) Outpatient Encounter Medications as of 09/11/2022  Medication Sig   anastrozole (ARIMIDEX) 1 MG tablet TAKE 1 TABLET(1 MG) BY MOUTH DAILY   Ascorbic Acid (VITAMIN C) 1000 MG tablet Take 1,000 mg by mouth daily.   aspirin EC 81 MG tablet Take 81 mg by mouth daily.   calcium carbonate (OS-CAL) 600 MG TABS tablet Take 1 tablet (600 mg total) by mouth daily.   cholecalciferol (VITAMIN D) 1000 units tablet Take 1 tablet (1,000 Units total) by mouth daily.   co-enzyme Q-10 30 MG capsule Take 30 mg by mouth 3 (three) times daily.   esomeprazole (NEXIUM) 20 MG capsule Take 20 mg by mouth daily at 12 noon. Patient takes as needed    hydrochlorothiazide (HYDRODIURIL) 25 MG tablet TAKE 1 TABLET(25 MG) BY MOUTH DAILY   Multiple Vitamin (MULTIVITAMIN) tablet Take 1 tablet by mouth daily.   simvastatin (ZOCOR) 10 MG tablet Take 1 tablet (10 mg total) by mouth at bedtime.   No facility-administered encounter medications on file as of 09/11/2022.    Allergies (verified) Patient has no known allergies.   History: Past Medical History:  Diagnosis Date   Allergy    per pt   Anxiety    Arthritis    knees    Ductal carcinoma in situ of breast    left   GERD (gastroesophageal reflux disease)    Heart murmur    hx of in past   Heart palpitations    Hyperlipidemia    Hypertension    Microscopic hematuria    Obesity    Past Surgical History:  Procedure Laterality Date   BREAST LUMPECTOMY WITH RADIOACTIVE SEED LOCALIZATION Left 06/04/2019   Procedure: LEFT BREAST LUMPECTOMY WITH RADIOACTIVE SEED LOCALIZATION;  Surgeon: TJovita Kussmaul MD;  [facility]  Service: General;  Laterality: Left;   calcium removed from left heel     COLONOSCOPY  2016   HEEL SPUR SURGERY     KNEE ARTHROSCOPY Left 04/22/2013   Procedure: LEFT KNEE ARTHROSCOPY WITH medial meniscusectomy and chondroplasty;  Surgeon: FGearlean Alf MD;  Location: [facility];  Service: Orthopedics;  Laterality: Left;   POLYPECTOMY     TONSILLECTOMY     TOTAL KNEE ARTHROPLASTY Right 02/16/2019   Procedure: TOTAL KNEE ARTHROPLASTY;  Surgeon: Gaynelle Arabian, MD;  Location: [facility];  Service: Orthopedics;  Laterality: Right;  39mn   TUBAL LIGATION     Family History  Problem Relation Age of Onset   Heart disease Mother 63      MI at [phone] [phone]  Cancer Father [phone]      Prostate Cancer   Colon polyps Father    Stomach cancer Father    Hodgkin's lymphoma Brother    Esophageal cancer Neg Hx    Rectal cancer Neg Hx    Colon cancer Neg Hx    Social History   Socioeconomic History   Marital status: Married    Spouse name: JJeneen Rinks  Number of  children: 3   Years of education: Not on file   Highest education level: Not on file  Occupational History   Occupation: retired   Tobacco Use   Smoking status: Former    Types: Cigarettes   Smokeless tobacco: Never   Tobacco comments:    just smoked in high school  Vaping Use   Vaping Use: Never used  Substance and Sexual Activity   Alcohol use: Not Currently   Drug use: No   Sexual activity: Yes    Birth control/protection: Surgical  Other Topics Concern   Not on file  Social History Narrative   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 No other exercise.Married. Never smoked.   2 sons, 1 daughter, 8 grandchildren.   Social Determinants of Health   Financial Resource Strain: Low Risk  (09/11/2022)   Overall Financial Resource Strain (CARDIA)    Difficulty of Paying Living Expenses: Not hard at all  Food Insecurity: No Food Insecurity (09/11/2022)   Hunger Vital Sign    Worried About Running Out of Food in the Last Year: Never true    Ran Out of Food in the Last Year: Never true  Transportation Needs: No Transportation Needs (09/11/2022)   PRAPARE - THydrologist(Medical): No    Lack of Transportation (Non-Medical): No  Physical Activity: Sufficiently Active (09/11/2022)   Exercise Vital Sign    Days of Exercise per Week: 6 days    Minutes of Exercise per Session: 40 min  Stress: No Stress Concern Present (09/11/2022)   [place]   Feeling of Stress : Only a little  Social Connections: Moderately Integrated (09/11/2022)   Social Connection and Isolation Panel [NHANES]    Frequency of Communication with Friends and Family: More than three times a week    Frequency of Social Gatherings with Friends and Family: Three times a week    Attends Religious Services: 1 to 4 times per year    Active Member of  Clubs or Organizations: No    Attends CArchivistMeetings: Never    Marital Status: Married    Tobacco Counseling Counseling given: Not Answered Tobacco comments: just smoked in high school   Clinical Intake:  Pre-visit preparation completed: Yes  Pain : No/denies pain  Diabetes: No  How often do you need to have someone help you when you read instructions, pamphlets, or other written materials from your doctor or pharmacy?: 1 -  Never  Diabetic?No   Interpreter Needed?: No  Information entered by :: Denman George LPN   Activities of Daily Living    09/11/2022    3:00 PM 09/10/2022    3:52 PM  In your present state of health, do you have any difficulty performing the following activities:  Hearing? 0 0  Vision? 0 0  Difficulty concentrating or making decisions? 0 0  Walking or climbing stairs? 0 1  Dressing or bathing? 0 0  Doing errands, shopping? 0 0  Preparing Food and eating ? N N  Using the Toilet? N N  In the past six months, have you accidently leaked urine? N Y  Do you have problems with loss of bowel control? N N  Managing your Medications? N N  Managing your Finances? N N  Housekeeping or managing your Housekeeping? N N    Patient Care Team: [name], MD as PCP - General (Family Medicine) [name] as Physician Assistant (Physician Assistant) [name], RN as Oncology Nurse Navigator [name], RN as Oncology Nurse Navigator [name], MD as Consulting Physician (General Surgery) [name], MD as Consulting Physician (Hematology) [name], MD as Attending Physician (Radiation Oncology) [name], NP as Nurse Practitioner (Nurse Practitioner)  Indicate any recent Medical Services you may have received from other than [facility] providers in the past year (date may be approximate).     Assessment:   This is a routine wellness examination for [place].  Hearing/Vision screen Hearing Screening -  Comments:: Denies hearing difficulties   Vision Screening - Comments::  up to date with routine eye exams with Dr. Macarthur Critchley    Dietary issues and exercise activities discussed: Current Exercise Habits: Home exercise routine, Type of exercise: walking, Time (Minutes): 40, Frequency (Times/Week): 6, Weekly Exercise (Minutes/Week): 240, Intensity: Mild   Goals Addressed             This Visit's Progress    Exercise 3x per week (30 min per time)   On track     Depression Screen    09/11/2022    2:59 PM 07/30/2022   11:03 AM 06/30/2021   10:09 AM 05/02/2020    1:19 PM 04/30/2018   10:37 AM 10/23/2017    3:11 PM 03/25/2017   11:20 AM  PHQ 2/9 Scores  PHQ - 2 Score 0 0 0 0 0 0 0  PHQ- 9 Score       0    Fall Risk    09/11/2022    2:58 PM 07/30/2022   11:03 AM 06/30/2021   10:13 AM 05/02/2020    1:19 PM 04/30/2018   10:37 AM  Wright in the past year? 0 0 0 0 No  Number falls in past yr: 0 0 0 0   Injury with Fall? 0 0 0 0   Risk for fall due to : No Fall Risks No Fall Risks No Fall Risks    Follow up Falls evaluation completed;Education provided;Falls prevention discussed Falls prevention discussed Falls prevention discussed      FALL RISK PREVENTION PERTAINING TO THE HOME:  Any stairs in or around the home? Yes  If so, are there any without handrails? No  Home free of loose throw rugs in walkways, pet beds, electrical cords, etc? Yes  Adequate lighting in your home to reduce risk of falls? Yes   ASSISTIVE DEVICES UTILIZED TO PREVENT FALLS:  Life alert? No  Use of a cane, walker or w/c? No  Grab bars in the bathroom? Yes  Shower chair or bench in shower? No  Elevated toilet seat or a handicapped toilet? Yes   TIMED UP AND GO:  Was the test performed? No . Telephonic visit   Cognitive Function:        09/11/2022    3:00 PM 06/30/2021   10:18 AM  6CIT Screen  What Year? 0 points 0 points  What month? 0 points 0 points  What time? 0 points 0  points  Count back from 20 0 points 0 points  Months in reverse 0 points 0 points  Repeat phrase 0 points 0 points  Total Score 0 points 0 points    Immunizations Immunization History  Administered Date(s) Administered   Fluad Quad(high Dose 65+) 08/04/2019, 06/29/2020   Influenza Split 06/26/2013   Influenza Whole 06/19/2007   Influenza, High Dose Seasonal PF 06/24/2018   Influenza,inj,Quad PF,6+ Mos 07/15/2014, 07/21/2015, 07/17/2016, 07/03/2017   Influenza-Unspecified 08/02/2021, 07/18/2022   PFIZER(Purple Top)SARS-COV-2 Vaccination 11/15/2019, 12/07/2019, 07/14/2020   Pfizer Covid-19 Vaccine Bivalent Booster 37yr & up 04/24/2021, 07/12/2021   Pneumococcal Conjugate-13 07/07/2018   Pneumococcal Polysaccharide-23 08/04/2019   Td 09/18/2003, 04/25/2011   Td (Adult), 2 Lf Tetanus Toxid, Preservative Free 09/18/2003, 04/25/2011   Tdap 03/14/2022   Zoster Recombinat (Shingrix) 11/13/2021, 03/14/2022   Zoster, Live 09/22/2013    TDAP status: Up to date  Flu Vaccine status: Up to date  Pneumococcal vaccine status: Up to date  Covid-19 vaccine status: Information provided on how to obtain vaccines.   Qualifies for Shingles Vaccine? Yes   Zostavax completed No   Shingrix Completed?: Yes  Screening Tests Health Maintenance  Topic Date Due   COVID-19 Vaccine (6 - 2023-24 season) 05/18/2022   COLONOSCOPY (Pts 45-465yrInsurance coverage will need to be confirmed)  12/09/2022   MAMMOGRAM  06/23/2023   Medicare Annual Wellness (AWV)  09/12/2023   DTaP/Tdap/Td (6 - Td or Tdap) 03/14/2032   Pneumonia Vaccine 6561Years old  Completed   INFLUENZA VACCINE  Completed   DEXA SCAN  Completed   Hepatitis C Screening  Completed   Zoster Vaccines- Shingrix  Completed   HPV VACCINES  Aged Out    Health Maintenance  Health Maintenance Due  Topic Date Due   COVID-19 Vaccine (6 - 2023-24 season) 05/18/2022    Colorectal cancer screening: Type of screening: Colonoscopy. Completed  12/09/19. Repeat every 3 years  Mammogram status: Completed 06/22/22. Repeat every year  Bone Density status: Completed 06/21/22. Results reflect: Bone density results: NORMAL. Repeat every 5 years.  Lung Cancer Screening: (Low Dose CT Chest recommended if Age 69-80ears, 30 pack-year currently smoking OR have quit w/in 15years.) does not qualify.   Lung Cancer Screening Referral: n/a  Additional Screening:  Hepatitis C Screening: does qualify; Completed 09/26/17  Vision Screening: Recommended annual ophthalmology exams for early detection of glaucoma and other disorders of the eye. Is the patient up to date with their annual eye exam?  Yes  Who is the provider or what is the name of the office in which the patient attends annual eye exams? Dr. JoMacarthur Critchleyf pt is not established with a provider, would they like to be referred to a provider to establish care? No .   Dental Screening: Recommended annual dental exams for proper oral hygiene  Community Resource Referral / Chronic Care Management: CRR required this visit?  No   CCM required this visit?  No      Plan:     I have personally reviewed and noted the following in the patient's chart:   Medical and social history Use of alcohol, tobacco or illicit drugs  Current medications and supplements including opioid prescriptions. Patient is not currently taking opioid prescriptions. Functional ability and status Nutritional status Physical activity Advanced directives List of other physicians Hospitalizations, surgeries, and ER visits in previous 12 months Vitals Screenings to include cognitive, depression, and falls Referrals and appointments  In addition, I have reviewed and discussed with patient certain preventive protocols, quality metrics, and best practice recommendations. A written personalized care plan for preventive services as well as general preventive health recommendations were provided to patient.     Vanetta Mulders, [place]   [id number]   Due to this being a virtual visit, the after visit summary with patients personalized plan was offered to patient via mail or my-chart.  Patient would like to access on my-chart  Nurse Notes: No concerns

## 2022-10-04 ENCOUNTER — Encounter: Payer: Medicare Other | Admitting: Family Medicine

## 2022-11-19 ENCOUNTER — Encounter: Payer: Self-pay | Admitting: Gastroenterology

## 2022-12-17 ENCOUNTER — Ambulatory Visit (AMBULATORY_SURGERY_CENTER): Payer: Medicare Other

## 2022-12-17 VITALS — Ht 69.0 in | Wt 180.0 lb

## 2022-12-17 DIAGNOSIS — Z86010 Personal history of colonic polyps: Secondary | ICD-10-CM

## 2022-12-17 MED ORDER — SUTAB 1479-225-188 MG PO TABS
12.0000 | ORAL_TABLET | ORAL | 0 refills | Status: DC
Start: 2022-12-17 — End: 2022-12-31

## 2022-12-17 NOTE — Progress Notes (Signed)

## 2022-12-19 ENCOUNTER — Telehealth: Payer: Self-pay | Admitting: Gastroenterology

## 2022-12-19 DIAGNOSIS — Z86010 Personal history of colonic polyps: Secondary | ICD-10-CM

## 2022-12-19 MED ORDER — ONDANSETRON HCL 4 MG PO TABS
ORAL_TABLET | ORAL | 0 refills | Status: DC
Start: 2022-12-19 — End: 2022-12-31

## 2022-12-19 MED ORDER — PEG 3350-KCL-NA BICARB-NACL 420 G PO SOLR
4000.0000 mL | Freq: Once | ORAL | 0 refills | Status: AC
Start: 2022-12-19 — End: 2022-12-19

## 2022-12-19 NOTE — Telephone Encounter (Signed)
Inbound call from patient requesting to speak with a nurse she said the insurance wont cover her prep kit for up coming procedure and want to know what else she can't take instead .please advise

## 2022-12-19 NOTE — Telephone Encounter (Signed)
New rx for golytely and zofran sent in for pt. To take and instructed pt. To call us with any other concerns new prep instructions reviewed with pt. And sent via my chart.

## 2022-12-31 ENCOUNTER — Ambulatory Visit (AMBULATORY_SURGERY_CENTER): Payer: Medicare Other | Admitting: Gastroenterology

## 2022-12-31 ENCOUNTER — Encounter: Payer: Self-pay | Admitting: Gastroenterology

## 2022-12-31 VITALS — BP 109/57 | HR 52 | Temp 97.8°F | Resp 12 | Ht 69.0 in | Wt 180.0 lb

## 2022-12-31 DIAGNOSIS — D128 Benign neoplasm of rectum: Secondary | ICD-10-CM | POA: Diagnosis not present

## 2022-12-31 DIAGNOSIS — E669 Obesity, unspecified: Secondary | ICD-10-CM | POA: Diagnosis not present

## 2022-12-31 DIAGNOSIS — Z09 Encounter for follow-up examination after completed treatment for conditions other than malignant neoplasm: Secondary | ICD-10-CM

## 2022-12-31 DIAGNOSIS — F419 Anxiety disorder, unspecified: Secondary | ICD-10-CM | POA: Diagnosis not present

## 2022-12-31 DIAGNOSIS — Z86010 Personal history of colonic polyps: Secondary | ICD-10-CM

## 2022-12-31 DIAGNOSIS — E785 Hyperlipidemia, unspecified: Secondary | ICD-10-CM | POA: Diagnosis not present

## 2022-12-31 DIAGNOSIS — I10 Essential (primary) hypertension: Secondary | ICD-10-CM | POA: Diagnosis not present

## 2022-12-31 HISTORY — PX: COLONOSCOPY: SHX174

## 2022-12-31 MED ORDER — SODIUM CHLORIDE 0.9 % IV SOLN
500.0000 mL | Freq: Once | INTRAVENOUS | Status: DC
Start: 2022-12-31 — End: 2022-12-31

## 2022-12-31 NOTE — Op Note (Signed)
[facility] Patient Name: [name] Procedure Date: 12/31/2022 1:11 PM MRN: [id number] Endoscopist: Napoleon Form , MD, [id number] Age: 72 Referring MD:  Date of Birth: 02/23/1951 Gender: Female Account #: [id number] Procedure:                Colonoscopy Indications:              High risk colon cancer surveillance: Personal                            history of colonic polyps, High risk colon cancer                            surveillance: Personal history of multiple (3 or                            more) adenomas, High risk colon cancer                            surveillance: Personal history of adenoma less than                            10 mm in size Medicines:                Monitored Anesthesia Care Procedure:                Pre-Anesthesia Assessment:                           - Prior to the procedure, a History and Physical                            was performed, and patient medications and                            allergies were reviewed. The patient's tolerance of                            previous anesthesia was also reviewed. The risks                            and benefits of the procedure and the sedation                            options and risks were discussed with the patient.                            All questions were answered, and informed consent                            was obtained. Prior Anticoagulants: The patient has                            taken no anticoagulant or antiplatelet agents. ASA  Grade Assessment: II - A patient with mild systemic                            disease. After reviewing the risks and benefits,                            the patient was deemed in satisfactory condition to                            undergo the procedure.                           After obtaining informed consent, the colonoscope                            was passed under direct vision. Throughout the                             procedure, the patient's blood pressure, pulse, and                            oxygen saturations were monitored continuously. The                            PCF-HQ190L Colonoscope [id number] was introduced                            through the anus and advanced to the the cecum,                            identified by appendiceal orifice and ileocecal                            valve. The colonoscopy was performed without                            difficulty. The patient tolerated the procedure                            well. The quality of the bowel preparation was                            adequate. The ileocecal valve, appendiceal orifice,                            and rectum were photographed. Scope In: 1:31:36 PM Scope Out: 1:50:06 PM Scope Withdrawal Time: 0 hours 9 minutes 38 seconds  Total Procedure Duration: 0 hours 18 minutes 30 seconds  Findings:                 The perianal and digital rectal examinations were                            normal.  A 3 mm polyp was found in the rectum. The polyp was                            sessile. The polyp was removed with a cold snare.                            Resection was complete but did not retrieve.                           Scattered large-mouthed, medium-mouthed and                            small-mouthed diverticula were found in the sigmoid                            colon, descending colon, transverse colon and                            ascending colon.                           Non-bleeding external and internal hemorrhoids were                            found during retroflexion. The hemorrhoids were                            medium-sized. Complications:            No immediate complications. Estimated Blood Loss:     Estimated blood loss was minimal. Impression:               - One 3 mm polyp in the rectum, removed with a cold                            snare. Resected but  not retrieved.                           - Diverticulosis in the sigmoid colon, in the                            descending colon, in the transverse colon and in                            the ascending colon.                           - Non-bleeding external and internal hemorrhoids. Recommendation:           - Patient has a contact number available for                            emergencies. The signs and symptoms of potential                            delayed  complications were discussed with the                            patient. Return to normal activities tomorrow.                            Written discharge instructions were provided to the                            patient.                           - Resume previous diet.                           - Continue present medications.                           - Repeat colonoscopy in 5 years for surveillance. Napoleon Form, MD 12/31/2022 2:04:43 PM This report has been signed electronically.

## 2022-12-31 NOTE — Progress Notes (Signed)
Called to room to assist during endoscopic procedure.  Patient ID and intended procedure confirmed with present staff. Received instructions for my participation in the procedure from the performing physician.  

## 2022-12-31 NOTE — Progress Notes (Signed)
Uneventful anesthetic. Report to pacu rn. Vss. Care resumed by rn. 

## 2022-12-31 NOTE — Patient Instructions (Addendum)
Thank you for letting us take care of your healthcare needs today. Please see handouts given to you on Polyps, Diverticulosis and Hemorrhoids.     YOU HAD AN ENDOSCOPIC PROCEDURE TODAY AT THE [facility]:   Refer to the procedure report that was given to you for any specific questions about what was found during the examination.  If the procedure report does not answer your questions, please call your gastroenterologist to clarify.  If you requested that your care partner not be given the details of your procedure findings, then the procedure report has been included in a sealed envelope for you to review at your convenience later.  YOU SHOULD EXPECT: Some feelings of bloating in the abdomen. Passage of more gas than usual.  Walking can help get rid of the air that was put into your GI tract during the procedure and reduce the bloating. If you had a lower endoscopy (such as a colonoscopy or flexible sigmoidoscopy) you may notice spotting of blood in your stool or on the toilet paper. If you underwent a bowel prep for your procedure, you may not have a normal bowel movement for a few days.  Please Note:  You might notice some irritation and congestion in your nose or some drainage.  This is from the oxygen used during your procedure.  There is no need for concern and it should clear up in a day or so.  SYMPTOMS TO REPORT IMMEDIATELY:  Following lower endoscopy (colonoscopy or flexible sigmoidoscopy):  Excessive amounts of blood in the stool  Significant tenderness or worsening of abdominal pains  Swelling of the abdomen that is new, acute  Fever of 100F or higher   For urgent or emergent issues, a gastroenterologist can be reached at any hour by calling [phone]. Do not use MyChart messaging for urgent concerns.    DIET:  We do recommend a small meal at first, but then you may proceed to your regular diet.  Drink plenty of fluids but you should avoid alcoholic beverages  for 24 hours.  ACTIVITY:  You should plan to take it easy for the rest of today and you should NOT DRIVE or use heavy machinery until tomorrow (because of the sedation medicines used during the test).    FOLLOW UP: Our staff will call the number listed on your records the next business day following your procedure.  We will call around 7:15- 8:00 am to check on you and address any questions or concerns that you may have regarding the information given to you following your procedure. If we do not reach you, we will leave a message.     If any biopsies were taken you will be contacted by phone or by letter within the next 1-3 weeks.  Please call us at [phone] if you have not heard about the biopsies in 3 weeks.    SIGNATURES/CONFIDENTIALITY: You and/or your care partner have signed paperwork which will be entered into your electronic medical record.  These signatures attest to the fact that that the information above on your After Visit Summary has been reviewed and is understood.  Full responsibility of the confidentiality of this discharge information lies with you and/or your care-partner. 

## 2022-12-31 NOTE — Progress Notes (Signed)
Pt's states no medical or surgical changes since previsit or office visit. 

## 2022-12-31 NOTE — Progress Notes (Signed)
[facility] History and Physical   Primary Care Physician:  Donita Brooks, MD   Reason for Procedure:  History of adenomatous colon polyps  Plan:    Surveillance colonoscopy with possible interventions as needed     HPI: [name] is a very pleasant 72 y.o. female here for surveillance colonoscopy. Denies any nausea, vomiting, abdominal pain, melena or bright red blood per rectum  The risks and benefits as well as alternatives of endoscopic procedure(s) have been discussed and reviewed. All questions answered. The patient agrees to proceed.    Past Medical History:  Diagnosis Date   Allergy    per pt   Anxiety    Arthritis    knees    Ductal carcinoma in situ of breast    left   GERD (gastroesophageal reflux disease)    Heart murmur    hx of in past   Heart palpitations    Hyperlipidemia    Hypertension    Microscopic hematuria    Obesity     Past Surgical History:  Procedure Laterality Date   BREAST LUMPECTOMY WITH RADIOACTIVE SEED LOCALIZATION Left 06/04/2019   Procedure: LEFT BREAST LUMPECTOMY WITH RADIOACTIVE SEED LOCALIZATION;  Surgeon: Griselda Miner, MD;  Location: [facility];  Service: General;  Laterality: Left;   calcium removed from left heel     COLONOSCOPY  2016   COLONOSCOPY  12/31/2022   HEEL SPUR SURGERY     KNEE ARTHROSCOPY Left 04/22/2013   Procedure: LEFT KNEE ARTHROSCOPY WITH medial meniscusectomy and chondroplasty;  Surgeon: Loanne Drilling, MD;  Location: [facility];  Service: Orthopedics;  Laterality: Left;   POLYPECTOMY     TONSILLECTOMY     TOTAL KNEE ARTHROPLASTY Right 02/16/2019   Procedure: TOTAL KNEE ARTHROPLASTY;  Surgeon: Ollen Gross, MD;  Location: [facility];  Service: Orthopedics;  Laterality: Right;    TUBAL LIGATION      Prior to Admission medications   Medication Sig Start Date End Date Taking? Authorizing Provider  anastrozole (ARIMIDEX) 1 MG tablet TAKE 1 TABLET(1 MG) BY MOUTH DAILY  01/26/22  Yes Malachy Mood, MD  Ascorbic Acid (VITAMIN C) 1000 MG tablet Take 1,000 mg by mouth daily.   Yes [provider]  aspirin EC 81 MG tablet Take 81 mg by mouth daily.   Yes [provider]  Cholecalciferol (VITAMIN D-3) 125 MCG (5000 UT) TABS Take 125 mcg by mouth daily.   Yes [provider]  Coenzyme Q10 (Q-10 CO-ENZYME PO) Take 150 mg by mouth daily.   Yes [provider]  hydrochlorothiazide (HYDRODIURIL) 25 MG tablet TAKE 1 TABLET(25 MG) BY MOUTH DAILY 09/11/22  Yes Donita Brooks, MD  MAGNESIUM GLYCINATE PO Take 400 mg by mouth daily. 11/10/22  Yes [provider]  acetaminophen (TYLENOL) 500 MG tablet Take 500 mg by mouth at bedtime as needed (trouble sleeping).    [provider]  esomeprazole (NEXIUM) 20 MG capsule Take 20 mg by mouth daily at 12 noon. Patient takes as needed    [provider]    Current Outpatient Medications  Medication Sig Dispense Refill   anastrozole (ARIMIDEX) 1 MG tablet TAKE 1 TABLET(1 MG) BY MOUTH DAILY 90 tablet 3   Ascorbic Acid (VITAMIN C) 1000 MG tablet Take 1,000 mg by mouth daily.     aspirin EC 81 MG tablet Take 81 mg by mouth daily.     Cholecalciferol (VITAMIN D-3) 125 MCG (5000 UT) TABS Take 125  mcg by mouth daily.     Coenzyme Q10 (Q-10 CO-ENZYME PO) Take 150 mg by mouth daily.     hydrochlorothiazide (HYDRODIURIL) 25 MG tablet TAKE 1 TABLET(25 MG) BY MOUTH DAILY 90 tablet 1   MAGNESIUM GLYCINATE PO Take 400 mg by mouth daily.     acetaminophen (TYLENOL) 500 MG tablet Take 500 mg by mouth at bedtime as needed (trouble sleeping).     esomeprazole (NEXIUM) 20 MG capsule Take 20 mg by mouth daily at 12 noon. Patient takes as needed     Current Facility-Administered Medications  Medication Dose Route Frequency Provider Last Rate Last Admin   0.9 %  sodium chloride infusion  500 mL Intravenous Once Napoleon Form, MD        Allergies as of 12/31/2022   (No Known  Allergies)    Family History  Problem Relation Age of Onset   Heart disease Mother 47       MI at 88 and 67   Cancer Father 76       Prostate Cancer   Colon polyps Father    Stomach cancer Father    Hodgkin's lymphoma Brother    Esophageal cancer Neg Hx    Rectal cancer Neg Hx    Colon cancer Neg Hx     Social History   Socioeconomic History   Marital status: Married    Spouse name: Fayrene Fearing   Number of children: 3   Years of education: Not on file   Highest education level: Not on file  Occupational History   Occupation: retired   Tobacco Use   Smoking status: Former    Types: Cigarettes   Smokeless tobacco: Never   Tobacco comments:    just smoked in high school  Vaping Use   Vaping Use: Never used  Substance and Sexual Activity   Alcohol use: Not Currently   Drug use: No   Sexual activity: Yes    Birth control/protection: Surgical  Other Topics Concern   Not on file  Social History Narrative   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 No other exercise.Married. Never smoked.   2 sons, 1 daughter, 8 grandchildren.   Social Determinants of Health   Financial Resource Strain: Low Risk  (09/11/2022)   Overall Financial Resource Strain (CARDIA)    Difficulty of Paying Living Expenses: Not hard at all  Food Insecurity: No Food Insecurity (09/11/2022)   Hunger Vital Sign    Worried About Running Out of Food in the Last Year: Never true    Ran Out of Food in the Last Year: Never true  Transportation Needs: No Transportation Needs (09/11/2022)   PRAPARE - Administrator, Civil Service (Medical): No    Lack of Transportation (Non-Medical): No  Physical Activity: Sufficiently Active (09/11/2022)   Exercise Vital Sign    Days of Exercise per Week: 6 days    Minutes of Exercise per Session: 40 min  Stress: No Stress Concern Present (09/11/2022)   [employer] of Occupational  Health - Occupational Stress Questionnaire    Feeling of Stress : Only a little  Social Connections: Moderately Integrated (09/11/2022)   Social Connection and Isolation Panel [NHANES]    Frequency of Communication with Friends and Family: More than three times a week    Frequency of Social Gatherings with Friends and Family: Three times a week    Attends Religious Services: 1 to 4  times per year    Active Member of Clubs or Organizations: No    Attends Banker Meetings: Never    Marital Status: Married  Catering manager Violence: Not At Risk (09/11/2022)   Humiliation, Afraid, Rape, and Kick questionnaire    Fear of Current or Ex-Partner: No    Emotionally Abused: No    Physically Abused: No    Sexually Abused: No    Review of Systems:  All other review of systems negative except as mentioned in the HPI.  Physical Exam: Vital signs in last 24 hours: Blood Pressure 124/64   Pulse (Abnormal) 54   Temperature 97.8 F (36.6 C) (Temporal)   Height 5\' 9"  (1.753 m)   Weight 180 lb (81.6 kg)   Oxygen Saturation 100%   Body Mass Index 26.58 kg/m  General:   Alert, NAD Lungs:  Clear .   Heart:  Regular rate and rhythm Abdomen:  Soft, nontender and nondistended. Neuro/Psych:  Alert and cooperative. Normal mood and affect. A and O x 3  Reviewed labs, radiology imaging, old records and pertinent past GI work up  Patient is appropriate for planned procedure(s) and anesthesia in an ambulatory setting   K. Scherry Ran , MD [phone]

## 2023-01-01 ENCOUNTER — Telehealth: Payer: Self-pay

## 2023-01-01 NOTE — Telephone Encounter (Signed)
  Follow up Call-     12/31/2022    1:07 PM  Call back number  Post procedure Call Back phone  # [phone]  Permission to leave phone message Yes     Patient questions:  Do you have a fever, pain , or abdominal swelling? No. Pain Score  0 *  Have you tolerated food without any problems? Yes.    Have you been able to return to your normal activities? Yes.    Do you have any questions about your discharge instructions: Diet   No. Medications  No. Follow up visit  No.  Do you have questions or concerns about your Care? No.  Actions: * If pain score is 4 or above: No action needed, pain <4.

## 2023-01-24 ENCOUNTER — Telehealth: Payer: Self-pay | Admitting: Hematology

## 2023-01-24 NOTE — Telephone Encounter (Signed)
Contacted patient to scheduled appointments. Patient is aware of appointments that are scheduled.   

## 2023-01-28 ENCOUNTER — Inpatient Hospital Stay: Payer: Medicare Other | Admitting: Hematology

## 2023-01-28 ENCOUNTER — Inpatient Hospital Stay: Payer: Medicare Other

## 2023-01-29 ENCOUNTER — Ambulatory Visit (INDEPENDENT_AMBULATORY_CARE_PROVIDER_SITE_OTHER): Payer: Medicare Other | Admitting: Family Medicine

## 2023-01-29 ENCOUNTER — Encounter: Payer: Self-pay | Admitting: Family Medicine

## 2023-01-29 VITALS — BP 126/78 | HR 62 | Temp 98.5°F | Ht 69.0 in | Wt 178.0 lb

## 2023-01-29 DIAGNOSIS — E7800 Pure hypercholesterolemia, unspecified: Secondary | ICD-10-CM

## 2023-01-29 NOTE — Progress Notes (Signed)
Subjective:    Patient ID: [name], female    DOB: Oct 30, 1950, 72 y.o.   MRN: [id number]  HPI Patient is a very pleasant 72 year old Caucasian female who is here today for a checkup.  Patient quit her statin after her last visit.  At that time her LDL was acceptable at 113.  She is following a doctor online who states that medicine is wrong to treat cholesterol with statins.  The patient is now very hesitant to take anything for her cholesterol. Past Medical History:  Diagnosis Date   Allergy    per pt   Anxiety    Arthritis    knees    Ductal carcinoma in situ of breast    left   GERD (gastroesophageal reflux disease)    Heart murmur    hx of in past   Heart palpitations    Hyperlipidemia    Hypertension    Microscopic hematuria    Obesity    Past Surgical History:  Procedure Laterality Date   BREAST LUMPECTOMY WITH RADIOACTIVE SEED LOCALIZATION Left 06/04/2019   Procedure: LEFT BREAST LUMPECTOMY WITH RADIOACTIVE SEED LOCALIZATION;  Surgeon: Griselda Miner, MD;  Location: [facility];  Service: General;  Laterality: Left;   calcium removed from left heel     COLONOSCOPY  2016   COLONOSCOPY  12/31/2022   HEEL SPUR SURGERY     KNEE ARTHROSCOPY Left 04/22/2013   Procedure: LEFT KNEE ARTHROSCOPY WITH medial meniscusectomy and chondroplasty;  Surgeon: Loanne Drilling, MD;  Location: [facility];  Service: Orthopedics;  Laterality: Left;   POLYPECTOMY     TONSILLECTOMY     TOTAL KNEE ARTHROPLASTY Right 02/16/2019   Procedure: TOTAL KNEE ARTHROPLASTY;  Surgeon: Ollen Gross, MD;  Location: [facility];  Service: Orthopedics;  Laterality: Right;    TUBAL LIGATION     Current Outpatient Medications on File Prior to Visit  Medication Sig Dispense Refill   acetaminophen (TYLENOL) 500 MG tablet Take 500 mg by mouth at bedtime as needed (trouble sleeping).     anastrozole (ARIMIDEX) 1 MG tablet TAKE 1 TABLET(1 MG) BY MOUTH DAILY 90 tablet 3   Ascorbic Acid (VITAMIN  C) 1000 MG tablet Take 1,000 mg by mouth daily.     aspirin EC 81 MG tablet Take 81 mg by mouth daily.     Cholecalciferol (VITAMIN D-3) 125 MCG (5000 UT) TABS Take 125 mcg by mouth daily.     Coenzyme Q10 (Q-10 CO-ENZYME PO) Take 150 mg by mouth daily.     esomeprazole (NEXIUM) 20 MG capsule Take 20 mg by mouth daily at 12 noon. Patient takes as needed     hydrochlorothiazide (HYDRODIURIL) 25 MG tablet TAKE 1 TABLET(25 MG) BY MOUTH DAILY 90 tablet 1   MAGNESIUM GLYCINATE PO Take 400 mg by mouth daily.     No current facility-administered medications on file prior to visit.   No Known Allergies Social History   Socioeconomic History   Marital status: Married    Spouse name: Fayrene Fearing   Number of children: 3   Years of education: Not on file   Highest education level: 12th grade  Occupational History   Occupation: retired   Tobacco Use   Smoking status: Former    Types: Cigarettes   Smokeless tobacco: Never   Tobacco comments:    just smoked in high school  Vaping Use   Vaping Use: Never used  Substance and Sexual Activity   Alcohol use: Not  Currently   Drug use: No   Sexual activity: Yes    Birth control/protection: Surgical  Other Topics Concern   Not on file  Social History Narrative   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 No other exercise.Married. Never smoked.   2 sons, 1 daughter, 8 grandchildren.   Social Determinants of Health   Financial Resource Strain: Low Risk  (01/25/2023)   Overall Financial Resource Strain (CARDIA)    Difficulty of Paying Living Expenses: Not hard at all  Food Insecurity: No Food Insecurity (01/25/2023)   Hunger Vital Sign    Worried About Running Out of Food in the Last Year: Never true    Ran Out of Food in the Last Year: Never true  Transportation Needs: No Transportation Needs (01/25/2023)   PRAPARE - Administrator, Civil Service (Medical): No    Lack  of Transportation (Non-Medical): No  Physical Activity: Sufficiently Active (01/25/2023)   Exercise Vital Sign    Days of Exercise per Week: 3 days    Minutes of Exercise per Session: 60 min  Stress: Stress Concern Present (01/25/2023)   [employer] of Occupational Health - Occupational Stress Questionnaire    Feeling of Stress : To some extent  Social Connections: Socially Integrated (01/25/2023)   Social Connection and Isolation Panel [NHANES]    Frequency of Communication with Friends and Family: More than three times a week    Frequency of Social Gatherings with Friends and Family: More than three times a week    Attends Religious Services: More than 4 times per year    Active Member of [employer] or Organizations: Yes    Attends Engineer, structural: More than 4 times per year    Marital Status: Married  Catering manager Violence: Not At Risk (09/11/2022)   Humiliation, Afraid, Rape, and Kick questionnaire    Fear of Current or Ex-Partner: No    Emotionally Abused: No    Physically Abused: No    Sexually Abused: No   Family History  Problem Relation Age of Onset   Heart disease Mother 9       MI at 89 and 23   Cancer Father 61       Prostate Cancer   Colon polyps Father    Stomach cancer Father    Hodgkin's lymphoma Brother    Esophageal cancer Neg Hx    Rectal cancer Neg Hx    Colon cancer Neg Hx      Review of Systems  All other systems reviewed and are negative.      Objective:   Physical Exam Vitals reviewed.  Constitutional:      General: She is not in acute distress.    Appearance: Normal appearance. She is normal weight. She is not ill-appearing, toxic-appearing or diaphoretic.  Cardiovascular:     Rate and Rhythm: Normal rate and regular rhythm.     Pulses: Normal pulses.     Heart sounds: Normal heart sounds. No murmur heard.    No friction rub. No gallop.  Pulmonary:     Effort: Pulmonary effort is normal. No respiratory distress.      Breath sounds: Normal breath sounds. No stridor. No wheezing, rhonchi or rales.  Chest:     Chest wall: No tenderness.  Abdominal:     General: Bowel sounds are normal. There is no distension.     Palpations: Abdomen is soft.  Tenderness: There is no abdominal tenderness. There is no guarding or rebound.  Musculoskeletal:     Right lower leg: No edema.     Left lower leg: No edema.  Lymphadenopathy:     Cervical: No cervical adenopathy.  Neurological:     General: No focal deficit present.     Mental Status: She is alert and oriented to person, place, and time.     Cranial Nerves: No cranial nerve deficit.     Sensory: No sensory deficit.     Coordination: Coordination normal.        Assessment & Plan:  Pure hypercholesterolemia - Plan: Lipid panel, COMPLETE METABOLIC PANEL WITH GFR Check cholesterol today.  If mildly elevated I would not push the issue.  If LDL cholesterol is significantly elevated, I would recommend getting a coronary artery calcium score to determine/rule stratify the patient further.  If there is significant plaque in her arteries, she may reconsider treating the cholesterol more aggressively

## 2023-01-30 LAB — COMPLETE METABOLIC PANEL WITH GFR
AG Ratio: 1.7 (calc) (ref 1.0–2.5)
ALT: 7 U/L (ref 6–29)
AST: 14 U/L (ref 10–35)
Albumin: 4.1 g/dL (ref 3.6–5.1)
Alkaline phosphatase (APISO): 52 U/L (ref 37–153)
BUN: 17 mg/dL (ref 7–25)
CO2: 27 mmol/L (ref 20–32)
Calcium: 9.6 mg/dL (ref 8.6–10.4)
Chloride: 106 mmol/L (ref 98–110)
Creat: 0.7 mg/dL (ref 0.60–1.00)
Globulin: 2.4 g/dL (calc) (ref 1.9–3.7)
Glucose, Bld: 86 mg/dL (ref 65–99)
Potassium: 3.6 mmol/L (ref 3.5–5.3)
Sodium: 146 mmol/L (ref 135–146)
Total Bilirubin: 0.5 mg/dL (ref 0.2–1.2)
Total Protein: 6.5 g/dL (ref 6.1–8.1)
eGFR: 93 mL/min/{1.73_m2} (ref >=60)

## 2023-01-30 LAB — LIPID PANEL
Cholesterol: 231 mg/dL — ABNORMAL HIGH (ref <200)
HDL: 66 mg/dL (ref >=50)
LDL Cholesterol (Calc): 143 mg/dL (calc) — ABNORMAL HIGH
Non-HDL Cholesterol (Calc): 165 mg/dL (calc) — ABNORMAL HIGH (ref <130)
Total CHOL/HDL Ratio: 3.5 (calc) (ref <5.0)
Triglycerides: 103 mg/dL (ref <150)

## 2023-01-31 ENCOUNTER — Other Ambulatory Visit: Payer: Self-pay

## 2023-01-31 ENCOUNTER — Other Ambulatory Visit: Payer: Self-pay | Admitting: Family Medicine

## 2023-01-31 DIAGNOSIS — E7800 Pure hypercholesterolemia, unspecified: Secondary | ICD-10-CM

## 2023-02-15 ENCOUNTER — Other Ambulatory Visit: Payer: Self-pay

## 2023-02-15 DIAGNOSIS — D0512 Intraductal carcinoma in situ of left breast: Secondary | ICD-10-CM

## 2023-02-18 ENCOUNTER — Inpatient Hospital Stay: Payer: Medicare Other | Attending: Hematology

## 2023-02-18 ENCOUNTER — Other Ambulatory Visit: Payer: Self-pay

## 2023-02-18 ENCOUNTER — Encounter: Payer: Self-pay | Admitting: Hematology

## 2023-02-18 ENCOUNTER — Inpatient Hospital Stay (HOSPITAL_BASED_OUTPATIENT_CLINIC_OR_DEPARTMENT_OTHER): Payer: Medicare Other | Admitting: Hematology

## 2023-02-18 VITALS — BP 121/73 | HR 60 | Temp 98.4°F | Resp 18 | Ht 69.0 in | Wt 173.9 lb

## 2023-02-18 DIAGNOSIS — Z79899 Other long term (current) drug therapy: Secondary | ICD-10-CM | POA: Insufficient documentation

## 2023-02-18 DIAGNOSIS — Z923 Personal history of irradiation: Secondary | ICD-10-CM | POA: Insufficient documentation

## 2023-02-18 DIAGNOSIS — D0512 Intraductal carcinoma in situ of left breast: Secondary | ICD-10-CM

## 2023-02-18 DIAGNOSIS — Z79811 Long term (current) use of aromatase inhibitors: Secondary | ICD-10-CM | POA: Insufficient documentation

## 2023-02-18 LAB — CBC WITH DIFFERENTIAL (CANCER CENTER ONLY)
Abs Immature Granulocytes: 0.01 10*3/uL (ref 0.00–0.07)
Basophils Absolute: 0.1 10*3/uL (ref 0.0–0.1)
Basophils Relative: 1 %
Eosinophils Absolute: 0.2 10*3/uL (ref 0.0–0.5)
Eosinophils Relative: 4 %
HCT: 42.5 % (ref 36.0–46.0)
Hemoglobin: 14.2 g/dL (ref 12.0–15.0)
Immature Granulocytes: 0 %
Lymphocytes Relative: 19 %
Lymphs Abs: 1.2 10*3/uL (ref 0.7–4.0)
MCH: 29 pg (ref 26.0–34.0)
MCHC: 33.4 g/dL (ref 30.0–36.0)
MCV: 86.9 fL (ref 80.0–100.0)
Monocytes Absolute: 0.6 10*3/uL (ref 0.1–1.0)
Monocytes Relative: 10 %
Neutro Abs: 4.2 10*3/uL (ref 1.7–7.7)
Neutrophils Relative %: 66 %
Platelet Count: 221 10*3/uL (ref 150–400)
RBC: 4.89 MIL/uL (ref 3.87–5.11)
RDW: 13.6 % (ref 11.5–15.5)
WBC Count: 6.3 10*3/uL (ref 4.0–10.5)
nRBC: 0 % (ref 0.0–0.2)

## 2023-02-18 LAB — CMP (CANCER CENTER ONLY)
ALT: 11 U/L (ref 0–44)
AST: 18 U/L (ref 15–41)
Albumin: 4.3 g/dL (ref 3.5–5.0)
Alkaline Phosphatase: 49 U/L (ref 38–126)
Anion gap: 9 (ref 5–15)
BUN: 15 mg/dL (ref 8–23)
CO2: 29 mmol/L (ref 22–32)
Calcium: 9.8 mg/dL (ref 8.9–10.3)
Chloride: 104 mmol/L (ref 98–111)
Creatinine: 0.72 mg/dL (ref 0.44–1.00)
GFR, Estimated: >60 mL/min (ref >60)
Glucose, Bld: 113 mg/dL — ABNORMAL HIGH (ref 70–99)
Potassium: 3.5 mmol/L (ref 3.5–5.1)
Sodium: 142 mmol/L (ref 135–145)
Total Bilirubin: 0.5 mg/dL (ref 0.3–1.2)
Total Protein: 6.9 g/dL (ref 6.5–8.1)

## 2023-02-18 MED ORDER — ANASTROZOLE 1 MG PO TABS
ORAL_TABLET | ORAL | 3 refills | Status: DC
Start: 2023-02-18 — End: 2024-03-05

## 2023-02-18 NOTE — Assessment & Plan Note (Signed)
-  diagnosed in 05/2019, s/p left lumpectomy and adjuvant radiation.  -She started antiestrogen therapy with Anastrozole in 08/2019. She has some hot flashes, no further joint pain. -most recent mammogram on 05/30/21 at [facility] was benign. -She is clinically doing well. Lab reviewed, CBC and CMP are within normal limits. Her physical exam was unremarkable. There is no clinical concern for recurrence. -Continue surveillance. Next mammogram in 05/2022. We discussed the option of screening MRI. She is interested if her insurance will cover it. -plan to see Dr. Carolynne Edouard for f/u in 07/2021 -f/u with me in one year

## 2023-02-18 NOTE — Progress Notes (Signed)
[facility]   [phone] Fax:(336) [phone]   Clinic Follow up Note   Patient Care Team: Donita Brooks, MD as PCP - General (Family Medicine) Deon Pilling as Physician Assistant (Physician Assistant) Donnelly Angelica, RN as Oncology Nurse Navigator Pershing Proud, RN as Oncology Nurse Navigator Griselda Miner, MD as Consulting Physician (General Surgery) Malachy Mood, MD as Consulting Physician (Hematology) Lonie Peak, MD as Attending Physician (Radiation Oncology) Pollyann Samples, NP as Nurse Practitioner (Nurse Practitioner)  Date of Service:  02/18/2023  CHIEF COMPLAINT: f/u of left breast DCIS  CURRENT THERAPY:  Anastrozole 1 mg daily since December 2020  ASSESSMENT:  [name] is a 72 y.o. female with   Ductal carcinoma in situ (DCIS) of left breast -diagnosed in 05/2019, s/p left lumpectomy and adjuvant radiation.  -She started antiestrogen therapy with Anastrozole in 08/2019. She has some hot flashes, no further joint pain.  Plan to continue for total of 5 years. -most recent mammogram on 06/22/2022 at [facility] was benign. -She is clinically doing well. Lab reviewed, CBC and CMP are within normal limits. Her physical exam was unremarkable. There is no clinical concern for recurrence. -f/u in one year  Bone health -Her bone density scan from June 21, 2022 was normal -We discussed that anastrozole can potentially weaken her bone.  Will continue to monitor her bone density every 2 years   PLAN: - continue Anastrozole - lab and f/u in one year - reviewed medication list - reviewed labs    SUMMARY OF ONCOLOGIC HISTORY: Oncology History Overview Note  Cancer Staging Ductal carcinoma in situ (DCIS) of left breast Staging form: Breast, AJCC 8th Edition - Clinical: Stage 0 (cTis (DCIS), cN0, cM0, ER+, PR+, HER2: Not Assessed) - Signed by Malachy Mood, MD on 05/06/2019 - Pathologic: No stage assigned - Unsigned    Ductal  carcinoma in situ (DCIS) of left breast  04/27/2019 Initial Biopsy   Diagnosis 04/27/19 Breast, left, needle core biopsy, lower outer calcification, 10cmfn - DUCTAL CARCINOMA IN SITU WITH CALCIFICATIONS. - SEE COMMENT.  Results: IMMUNOHISTOCHEMICAL AND MORPHOMETRIC ANALYSIS PERFORMED MANUALLY Estrogen Receptor: 100%, POSITIVE, STRONG STAINING INTENSITY Progesterone Receptor: 80%, POSITIVE, STRONG STAINING INTENSITY   04/30/2019 Initial Diagnosis   Ductal carcinoma in situ (DCIS) of left breast   05/06/2019 Cancer Staging   Staging form: Breast, AJCC 8th Edition - Clinical: Stage 0 (cTis (DCIS), cN0, cM0, ER+, PR+, HER2: Not Assessed) - Signed by Malachy Mood, MD on 05/06/2019   06/04/2019 Surgery   LEFT BREAST LUMPECTOMY WITH RADIOACTIVE SEED LOCALIZATION by Dr Carolynne Edouard  06/04/19    06/04/2019 Pathology Results   DIAGNOSIS: 06/04/19  A. BREAST, LEFT, LUMPECTOMY:  -  Ductal carcinoma in situ, intermediate grade, 0.3 cm  -  Margins uninvolved by carcinoma (0.2 cm; medial margin)  -  Previous biopsy site changes  -  See oncology table below   B. BREAST, LEFT SUPERIOR MARGIN, EXCISION:  -  Focal residual ductal carcinoma in situ  -  Margins uninvolved by carcinoma (0.3 cm)   C. BREAST, LEFT MEDIAL MARGIN, EXCISION:  -  No residual carcinoma identified   D. BREAST, LEFT DEEP MARGIN, EXCISION:  -  No residual carcinoma identified     07/06/2019 - 07/31/2019 Radiation Therapy   Adjuvant Radiation with Dr Basilio Cairo 07/06/19-07/31/19   08/2019 -  Anti-estrogen oral therapy   Anastrozole once daily starting in 08/2019   11/02/2019 Survivorship   SCP delivered by Santiago Glad, NP  INTERVAL HISTORY:  [name] is here for a follow up of  DCIS. She was last seen by me a year ago. She presents to the clinic alone. Patient is doing well. Has discontinued several meds. Patient is having hot flashes but they are tolerated.    All other systems were reviewed with the patient and are  negative.  MEDICAL HISTORY:  Past Medical History:  Diagnosis Date   Allergy    per pt   Anxiety    Arthritis    knees    Ductal carcinoma in situ of breast    left   GERD (gastroesophageal reflux disease)    Heart murmur    hx of in past   Heart palpitations    Hyperlipidemia    Hypertension    Microscopic hematuria    Obesity     SURGICAL HISTORY: Past Surgical History:  Procedure Laterality Date   BREAST LUMPECTOMY WITH RADIOACTIVE SEED LOCALIZATION Left 06/04/2019   Procedure: LEFT BREAST LUMPECTOMY WITH RADIOACTIVE SEED LOCALIZATION;  Surgeon: Griselda Miner, MD;  Location: [facility];  Service: General;  Laterality: Left;   calcium removed from left heel     COLONOSCOPY  2016   COLONOSCOPY  12/31/2022   HEEL SPUR SURGERY     KNEE ARTHROSCOPY Left 04/22/2013   Procedure: LEFT KNEE ARTHROSCOPY WITH medial meniscusectomy and chondroplasty;  Surgeon: Loanne Drilling, MD;  Location: [facility];  Service: Orthopedics;  Laterality: Left;   POLYPECTOMY     TONSILLECTOMY     TOTAL KNEE ARTHROPLASTY Right 02/16/2019   Procedure: TOTAL KNEE ARTHROPLASTY;  Surgeon: Ollen Gross, MD;  Location: [facility];  Service: Orthopedics;  Laterality: Right;    TUBAL LIGATION      I have reviewed the social history and family history with the patient and they are unchanged from previous note.  ALLERGIES:  has No Known Allergies.  MEDICATIONS:  Current Outpatient Medications  Medication Sig Dispense Refill   anastrozole (ARIMIDEX) 1 MG tablet TAKE 1 TABLET(1 MG) BY MOUTH DAILY 90 tablet 3   Ascorbic Acid (VITAMIN C) 1000 MG tablet Take 1,000 mg by mouth daily.     aspirin EC 81 MG tablet Take 81 mg by mouth daily.     Cholecalciferol (VITAMIN D-3) 125 MCG (5000 UT) TABS Take 125 mcg by mouth daily.     Coenzyme Q10 (Q-10 CO-ENZYME PO) Take 150 mg by mouth daily.     diphenhydramine-acetaminophen (TYLENOL PM) 25-500 MG TABS tablet Take 1 tablet by mouth at bedtime as  needed.     hydrochlorothiazide (HYDRODIURIL) 25 MG tablet TAKE 1 TABLET(25 MG) BY MOUTH DAILY 90 tablet 1   MAGNESIUM GLYCINATE PO Take 400 mg by mouth daily.     No current facility-administered medications for this visit.    PHYSICAL EXAMINATION: ECOG PERFORMANCE STATUS: 0 - Asymptomatic  Vitals:   02/18/23 1433  BP: 121/73  Pulse: 60  Resp: 18  Temp: 98.4 F (36.9 C)  SpO2: 100%   Wt Readings from Last 3 Encounters:  02/18/23 173 lb 14.4 oz (78.9 kg)  01/29/23 178 lb (80.7 kg)  12/31/22 180 lb (81.6 kg)     GENERAL:alert, no distress and comfortable SKIN: skin color, texture, turgor are normal, no rashes or significant lesions EYES: normal, Conjunctiva are pink and non-injected, sclera clear NECK: supple, thyroid normal size, non-tender, without nodularity LYMPH:  no palpable lymphadenopathy in the cervical, axillary  LUNGS: clear to auscultation and percussion with normal  breathing effort HEART: regular rate & rhythm and no murmurs and no lower extremity edema ABDOMEN:abdomen soft, non-tender and normal bowel sounds Musculoskeletal:no cyanosis of digits and no clubbing  NEURO: alert & oriented x 3 with fluent speech, no focal motor/sensory deficits BREAST: normal breast exam, some scar tissue at incision   LABORATORY DATA:  I have reviewed the data as listed    Latest Ref Rng & Units 02/18/2023    1:53 PM 07/30/2022   11:24 AM 01/26/2022   11:29 AM  CBC  WBC 4.0 - 10.5 K/uL 6.3  4.7  5.1   Hemoglobin 12.0 - 15.0 g/dL 76.7  20.9  47.0   Hematocrit 36.0 - 46.0 % 42.5  43.5  41.7   Platelets 150 - 400 K/uL 221  238  216         Latest Ref Rng & Units 02/18/2023    1:53 PM 01/29/2023   11:16 AM 07/30/2022   11:24 AM  CMP  Glucose 70 - 99 mg/dL 962  86  91   BUN 8 - 23 mg/dL 15  17  15    Creatinine 0.44 - 1.00 mg/dL 8.36  6.29  4.76   Sodium 135 - 145 mmol/L 142  146  143   Potassium 3.5 - 5.1 mmol/L 3.5  3.6  3.9   Chloride 98 - 111 mmol/L 104  106  104    CO2 22 - 32 mmol/L 29  27  30    Calcium 8.9 - 10.3 mg/dL 9.8  9.6  9.9   Total Protein 6.5 - 8.1 g/dL 6.9  6.5  6.7   Total Bilirubin 0.3 - 1.2 mg/dL 0.5  0.5  0.5   Alkaline Phos 38 - 126 U/L 49     AST 15 - 41 U/L 18  14  16    ALT 0 - 44 U/L 11  7  10        RADIOGRAPHIC STUDIES: I have personally reviewed the radiological images as listed and agreed with the findings in the report. No results found.    No orders of the defined types were placed in this encounter.  All questions were answered. The patient knows to call the clinic with any problems, questions or concerns. No barriers to learning was detected. The total time spent in the appointment was 20 minutes.     Malachy Mood, MD 02/18/2023   I, Sharlette Dense, CMA, am acting as scribe for Malachy Mood, MD.   I have reviewed the above documentation for accuracy and completeness, and I agree with the above.

## 2023-02-21 DIAGNOSIS — H43813 Vitreous degeneration, bilateral: Secondary | ICD-10-CM | POA: Diagnosis not present

## 2023-02-27 ENCOUNTER — Ambulatory Visit (HOSPITAL_COMMUNITY)
Admission: RE | Admit: 2023-02-27 | Discharge: 2023-02-27 | Disposition: A | Discharge status: Home / Self Care | Payer: Medicare Other | Source: Ambulatory Visit | Attending: Family Medicine | Admitting: Family Medicine

## 2023-02-27 DIAGNOSIS — E7800 Pure hypercholesterolemia, unspecified: Secondary | ICD-10-CM | POA: Insufficient documentation

## 2023-06-21 ENCOUNTER — Other Ambulatory Visit: Payer: Self-pay | Admitting: Family Medicine

## 2023-06-21 NOTE — Telephone Encounter (Signed)
Due to a system glitch the last office visit for this practice is not detected correctly.    LOV 01/29/2023.     Requested Prescriptions  Pending Prescriptions Disp Refills   hydrochlorothiazide (HYDRODIURIL) 25 MG tablet [Pharmacy Med Name: HYDROCHLOROTHIAZIDE 25MG  TABLETS] 90 tablet 0    Sig: TAKE 1 TABLET(25 MG) BY MOUTH DAILY     Cardiovascular: Diuretics - Thiazide Failed - 06/21/2023 11:40 AM      Failed - Valid encounter within last 6 months    Recent Outpatient Visits           1 year ago Acute left-sided low back pain without sciatica   [facility] Valentino Nose, NP   2 years ago Pure hypercholesterolemia   [facility] Pickard, Priscille Heidelberg, MD   2 years ago Pure hypercholesterolemia   [facility] Tanya Nones, Priscille Heidelberg, MD   3 years ago Cervical cancer screening   [facility] Donita Brooks, MD   4 years ago Essential hypertension   [facility] Pickard, Priscille Heidelberg, MD              Passed - Cr in normal range and within 180 days    Creatinine  Date Value Ref Range Status  02/18/2023 0.72 0.44 - 1.00 mg/dL Final   Creat  Date Value Ref Range Status  01/29/2023 0.70 0.60 - 1.00 mg/dL Final         Passed - K in normal range and within 180 days    Potassium  Date Value Ref Range Status  02/18/2023 3.5 3.5 - 5.1 mmol/L Final         Passed - Na in normal range and within 180 days    Sodium  Date Value Ref Range Status  02/18/2023 142 135 - 145 mmol/L Final         Passed - Last BP in normal range    BP Readings from Last 1 Encounters:  02/18/23 121/73

## 2023-06-24 DIAGNOSIS — Z1231 Encounter for screening mammogram for malignant neoplasm of breast: Secondary | ICD-10-CM | POA: Diagnosis not present

## 2023-06-24 LAB — HM MAMMOGRAPHY

## 2023-06-28 ENCOUNTER — Encounter: Payer: Self-pay | Admitting: Hematology

## 2023-07-08 ENCOUNTER — Encounter: Payer: Self-pay | Admitting: Family Medicine

## 2023-07-31 DIAGNOSIS — Z23 Encounter for immunization: Secondary | ICD-10-CM | POA: Diagnosis not present

## 2023-09-17 ENCOUNTER — Other Ambulatory Visit: Payer: Self-pay | Admitting: Family Medicine

## 2023-12-20 ENCOUNTER — Other Ambulatory Visit: Payer: Self-pay | Admitting: Family Medicine

## 2023-12-20 NOTE — Telephone Encounter (Signed)
 Requested medication (s) are due for refill today: yes   Requested medication (s) are on the active medication list: yes  Last refill:  09/17/23 #90 0 refills  Future visit scheduled: no   Notes to clinic:  protocol failed last labs 02/18/23. Last OV 01/29/23. Needs OV. Do you want to refill Rx?     Requested Prescriptions  Pending Prescriptions Disp Refills   hydrochlorothiazide (HYDRODIURIL) 25 MG tablet [Pharmacy Med Name: HYDROCHLOROTHIAZIDE 25MG  TABLETS] 90 tablet 0    Sig: TAKE 1 TABLET(25 MG) BY MOUTH DAILY     Cardiovascular: Diuretics - Thiazide Failed - 12/20/2023  4:09 PM      Failed - Cr in normal range and within 180 days    Creatinine  Date Value Ref Range Status  02/18/2023 0.72 0.44 - 1.00 mg/dL Final   Creat  Date Value Ref Range Status  01/29/2023 0.70 0.60 - 1.00 mg/dL Final         Failed - K in normal range and within 180 days    Potassium  Date Value Ref Range Status  02/18/2023 3.5 3.5 - 5.1 mmol/L Final         Failed - Na in normal range and within 180 days    Sodium  Date Value Ref Range Status  02/18/2023 142 135 - 145 mmol/L Final         Failed - Valid encounter within last 6 months    Recent Outpatient Visits           10 months ago Pure hypercholesterolemia   [facility] Donita Brooks, MD   1 year ago Pure hypercholesterolemia   [facility] Donita Brooks, MD              Passed - Last BP in normal range    BP Readings from Last 1 Encounters:  02/18/23 121/73

## 2024-01-02 ENCOUNTER — Encounter: Payer: Self-pay | Admitting: Family Medicine

## 2024-01-02 ENCOUNTER — Ambulatory Visit: Admitting: Family Medicine

## 2024-01-02 VITALS — BP 146/88 | HR 62 | Temp 98.4°F | Ht 69.0 in | Wt 178.0 lb

## 2024-01-02 DIAGNOSIS — I10 Essential (primary) hypertension: Secondary | ICD-10-CM

## 2024-01-02 DIAGNOSIS — E7800 Pure hypercholesterolemia, unspecified: Secondary | ICD-10-CM | POA: Diagnosis not present

## 2024-01-02 MED ORDER — HYDROCHLOROTHIAZIDE 25 MG PO TABS
ORAL_TABLET | ORAL | 3 refills | Status: AC
Start: 2024-01-02 — End: ?

## 2024-01-02 NOTE — Progress Notes (Signed)
 Subjective:    Patient ID: [name], female    DOB: Oct 05, 1950, 73 y.o.   MRN: [id number]  HPI Please see office visit last year.  Patient was hesitant to take any statin due to information she had read on the Internet.  As a result we ordered a coronary artery calcium score.  The results are included below. FINDINGS: Coronary arteries: Normal origins.   Coronary Calcium Score:   Left main: 0   Left anterior descending artery: 15   Left circumflex artery: 2   Right coronary artery: 13   Total: 30   Percentile: 57   Pericardium: Normal.   Aorta: Normal caliber of ascending aorta. No aortic atherosclerosis noted.   Non-cardiac: See separate report from [facility].   IMPRESSION: Coronary calcium score of 30. This was 42 percentile for age-, race-, and sex-matched controls.  Patient continues to want to avoid statins.  She states that when she last took them, she had muscle spasms in her hands and muscle pain.  She is walking 2 miles a day and she is try to eat a low carbohydrate diet.  Her blood pressure slightly elevated today however she has been out of her hydrochlorothiazide for about 4 days.  She denies any chest pain shortness of breath or dyspnea on exertion.  Her mammogram is up-to-date.  Her colonoscopy is up-to-date.  Her bone density is up-to-date.  All of her immunizations are up-to-date. Past Medical History:  Diagnosis Date   Allergy    per pt   Anxiety    Arthritis    knees    Ductal carcinoma in situ of breast    left   GERD (gastroesophageal reflux disease)    Heart murmur    hx of in past   Heart palpitations    Hyperlipidemia    Hypertension    Microscopic hematuria    Obesity    Past Surgical History:  Procedure Laterality Date   BREAST LUMPECTOMY WITH RADIOACTIVE SEED LOCALIZATION Left 06/04/2019   Procedure: LEFT BREAST LUMPECTOMY WITH RADIOACTIVE SEED LOCALIZATION;  Surgeon: Caralyn Chandler, MD;  Location: [facility];  Service: General;  Laterality: Left;   calcium removed from left heel     COLONOSCOPY  2016   COLONOSCOPY  12/31/2022   HEEL SPUR SURGERY     KNEE ARTHROSCOPY Left 04/22/2013   Procedure: LEFT KNEE ARTHROSCOPY WITH medial meniscusectomy and chondroplasty;  Surgeon: Aurther Blue, MD;  Location: [facility];  Service: Orthopedics;  Laterality: Left;   POLYPECTOMY     TONSILLECTOMY     TOTAL KNEE ARTHROPLASTY Right 02/16/2019   Procedure: TOTAL KNEE ARTHROPLASTY;  Surgeon: Liliane Rei, MD;  Location: [facility];  Service: Orthopedics;  Laterality: Right;    TUBAL LIGATION     Current Outpatient Medications on File Prior to Visit  Medication Sig Dispense Refill   anastrozole (ARIMIDEX) 1 MG tablet TAKE 1 TABLET(1 MG) BY MOUTH DAILY 90 tablet 3   Ascorbic Acid (VITAMIN C) 1000 MG tablet Take 1,000 mg by mouth daily.     aspirin EC 81 MG tablet Take 81 mg by mouth daily.     Cholecalciferol (VITAMIN D-3) 125 MCG (5000 UT) TABS Take 125 mcg by mouth daily.     Coenzyme Q10 (Q-10 CO-ENZYME PO) Take 150 mg by mouth daily.     diphenhydramine-acetaminophen (TYLENOL PM) 25-500 MG TABS tablet Take 1 tablet by mouth at bedtime as needed.     hydrochlorothiazide (  HYDRODIURIL) 25 MG tablet TAKE 1 TABLET(25 MG) BY MOUTH DAILY 90 tablet 0   MAGNESIUM GLYCINATE PO Take 400 mg by mouth daily.     No current facility-administered medications on file prior to visit.   No Known Allergies Social History   Socioeconomic History   Marital status: Married    Spouse name: Royston Cornea   Number of children: 3   Years of education: Not on file   Highest education level: 12th grade  Occupational History   Occupation: retired   Tobacco Use   Smoking status: Former    Types: Cigarettes   Smokeless tobacco: Never   Tobacco comments:    just smoked in high school  Vaping Use   Vaping status: Never Used  Substance and Sexual Activity   Alcohol use: Not Currently   Drug use: No   Sexual activity:  Yes    Birth control/protection: Surgical  Other Topics Concern   Not on file  Social History Narrative   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 No other exercise.Married. Never smoked.   2 sons, 1 daughter, 8 grandchildren.   Social Drivers of Corporate investment banker Strain: Low Risk  (12/27/2023)   Overall Financial Resource Strain (CARDIA)    Difficulty of Paying Living Expenses: Not hard at all  Food Insecurity: No Food Insecurity (12/27/2023)   Hunger Vital Sign    Worried About Running Out of Food in the Last Year: Never true    Ran Out of Food in the Last Year: Never true  Transportation Needs: No Transportation Needs (12/27/2023)   PRAPARE - Administrator, Civil Service (Medical): No    Lack of Transportation (Non-Medical): No  Physical Activity: Sufficiently Active (12/27/2023)   Exercise Vital Sign    Days of Exercise per Week: 7 days    Minutes of Exercise per Session: 80 min  Stress: No Stress Concern Present (12/27/2023)   [employer] of Occupational Health - Occupational Stress Questionnaire    Feeling of Stress : Only a little  Social Connections: Socially Integrated (12/27/2023)   Social Connection and Isolation Panel [NHANES]    Frequency of Communication with Friends and Family: More than three times a week    Frequency of Social Gatherings with Friends and Family: More than three times a week    Attends Religious Services: More than 4 times per year    Active Member of [employer] or Organizations: Yes    Attends Engineer, structural: More than 4 times per year    Marital Status: Married  Catering manager Violence: Not At Risk (09/11/2022)   Humiliation, Afraid, Rape, and Kick questionnaire    Fear of Current or Ex-Partner: No    Emotionally Abused: No    Physically Abused: No    Sexually Abused: No   Family History  Problem Relation Age of Onset   Heart disease  Mother 12       MI at 4 and 66   Cancer Father 77       Prostate Cancer   Colon polyps Father    Stomach cancer Father    Hodgkin's lymphoma Brother    Esophageal cancer Neg Hx    Rectal cancer Neg Hx    Colon cancer Neg Hx      Review of Systems  All other systems reviewed and are negative.      Objective:   Physical Exam  Vitals reviewed.  Constitutional:      General: She is not in acute distress.    Appearance: Normal appearance. She is normal weight. She is not ill-appearing, toxic-appearing or diaphoretic.  Cardiovascular:     Rate and Rhythm: Normal rate and regular rhythm.     Pulses: Normal pulses.     Heart sounds: Normal heart sounds. No murmur heard.    No friction rub. No gallop.  Pulmonary:     Effort: Pulmonary effort is normal. No respiratory distress.     Breath sounds: Normal breath sounds. No stridor. No wheezing, rhonchi or rales.  Chest:     Chest wall: No tenderness.  Abdominal:     General: Bowel sounds are normal. There is no distension.     Palpations: Abdomen is soft.     Tenderness: There is no abdominal tenderness. There is no guarding or rebound.  Musculoskeletal:     Right lower leg: No edema.     Left lower leg: No edema.  Lymphadenopathy:     Cervical: No cervical adenopathy.  Neurological:     General: No focal deficit present.     Mental Status: She is alert and oriented to person, place, and time.     Cranial Nerves: No cranial nerve deficit.     Sensory: No sensory deficit.     Coordination: Coordination normal.        Assessment & Plan:  Pure hypercholesterolemia  Primary hypertension Resume hydrochlorothiazide.  Preventative care is up-to-date.  Encouraged the patient to reconsider her decision to take a statin.  Patient politely declines.  Otherwise the remainder of her preventative care is up-to-date.  Regular anticipatory guidance was provided

## 2024-01-03 LAB — CBC WITH DIFFERENTIAL/PLATELET
Absolute Lymphocytes: 1472 {cells}/uL (ref 850–3900)
Absolute Monocytes: 626 {cells}/uL (ref 200–950)
Basophils Absolute: 32 {cells}/uL (ref 0–200)
Basophils Relative: 0.7 %
Eosinophils Absolute: 243 {cells}/uL (ref 15–500)
Eosinophils Relative: 5.4 %
HCT: 42.7 % (ref 35.0–45.0)
Hemoglobin: 13.6 g/dL (ref 11.7–15.5)
MCH: 28.7 pg (ref 27.0–33.0)
MCHC: 31.9 g/dL — ABNORMAL LOW (ref 32.0–36.0)
MCV: 90.1 fL (ref 80.0–100.0)
MPV: 10.3 fL (ref 7.5–12.5)
Monocytes Relative: 13.9 %
Neutro Abs: 2129 {cells}/uL (ref 1500–7800)
Neutrophils Relative %: 47.3 %
Platelets: 207 10*3/uL (ref 140–400)
RBC: 4.74 10*6/uL (ref 3.80–5.10)
RDW: 13.1 % (ref 11.0–15.0)
Total Lymphocyte: 32.7 %
WBC: 4.5 10*3/uL (ref 3.8–10.8)

## 2024-01-03 LAB — COMPLETE METABOLIC PANEL WITHOUT GFR
AG Ratio: 2.2 (calc) (ref 1.0–2.5)
ALT: 10 U/L (ref 6–29)
AST: 14 U/L (ref 10–35)
Albumin: 4.1 g/dL (ref 3.6–5.1)
Alkaline phosphatase (APISO): 45 U/L (ref 37–153)
BUN: 15 mg/dL (ref 7–25)
CO2: 27 mmol/L (ref 20–32)
Calcium: 9.2 mg/dL (ref 8.6–10.4)
Chloride: 108 mmol/L (ref 98–110)
Creat: 0.67 mg/dL (ref 0.60–1.00)
Globulin: 1.9 g/dL (ref 1.9–3.7)
Glucose, Bld: 92 mg/dL (ref 65–99)
Potassium: 3.8 mmol/L (ref 3.5–5.3)
Sodium: 144 mmol/L (ref 135–146)
Total Bilirubin: 0.4 mg/dL (ref 0.2–1.2)
Total Protein: 6 g/dL — ABNORMAL LOW (ref 6.1–8.1)

## 2024-01-03 LAB — LIPID PANEL
Cholesterol: 220 mg/dL — ABNORMAL HIGH (ref <200)
HDL: 65 mg/dL (ref >=50)
LDL Cholesterol (Calc): 140 mg/dL — ABNORMAL HIGH
Non-HDL Cholesterol (Calc): 155 mg/dL — ABNORMAL HIGH (ref <130)
Total CHOL/HDL Ratio: 3.4 (calc) (ref <5.0)
Triglycerides: 63 mg/dL (ref <150)

## 2024-01-28 ENCOUNTER — Encounter: Payer: Self-pay | Admitting: Family Medicine

## 2024-01-28 ENCOUNTER — Ambulatory Visit (INDEPENDENT_AMBULATORY_CARE_PROVIDER_SITE_OTHER): Admitting: Family Medicine

## 2024-01-28 VITALS — BP 126/80 | HR 59 | Temp 98.4°F | Ht 69.0 in | Wt 173.0 lb

## 2024-01-28 DIAGNOSIS — Z0001 Encounter for general adult medical examination with abnormal findings: Secondary | ICD-10-CM

## 2024-01-28 DIAGNOSIS — M79671 Pain in right foot: Secondary | ICD-10-CM | POA: Diagnosis not present

## 2024-01-28 DIAGNOSIS — Z0000 Encounter for general adult medical examination without abnormal findings: Secondary | ICD-10-CM

## 2024-01-28 DIAGNOSIS — E7800 Pure hypercholesterolemia, unspecified: Secondary | ICD-10-CM | POA: Diagnosis not present

## 2024-01-28 DIAGNOSIS — I10 Essential (primary) hypertension: Secondary | ICD-10-CM | POA: Diagnosis not present

## 2024-01-28 NOTE — Progress Notes (Signed)
 Subjective:    Patient ID: [name], female    DOB: 09-14-1951, 73 y.o.   MRN: [id number]  HPI Patient had a colonoscopy in 2024.  They recommended a repeat colonoscopy 5 years.  Her mammogram was performed in December and is up-to-date.  She had a bone density test in 2023 that was normal.  She is due again in 2028.  Her most recent immunizations are listed. Immunization History  Administered Date(s) Administered   Fluad Quad(high Dose 65+) 08/04/2019, 06/29/2020   Influenza Split 06/26/2013   Influenza Whole 06/19/2007   Influenza, High Dose Seasonal PF 06/24/2018   Influenza,inj,Quad PF,6+ Mos 07/15/2014, 07/21/2015, 07/17/2016, 07/03/2017   Influenza-Unspecified 08/02/2021, 07/18/2022   PFIZER(Purple Top)SARS-COV-2 Vaccination 11/15/2019, 12/07/2019, 07/14/2020   Pfizer Covid-19 Vaccine Bivalent Booster 12yrs & up 04/24/2021, 07/12/2021   Pneumococcal Conjugate-13 07/07/2018   Pneumococcal Polysaccharide-23 08/04/2019   Td 09/18/2003, 04/25/2011   Td (Adult), 2 Lf Tetanus Toxid, Preservative Free 09/18/2003, 04/25/2011   Tdap 04/25/2011, 03/14/2022   Zoster Recombinant(Shingrix) 11/13/2021, 03/14/2022   Zoster, Live 09/22/2013  Patient has refused statin therapy for her hyperlipidemia.  Patient had a coronary artery calcium  score in the last year that placed her in the 57th percentile compared to women her age.  Past Medical History:  Diagnosis Date   Allergy    per pt   Anxiety    Arthritis    knees    Ductal carcinoma in situ of breast    left   GERD (gastroesophageal reflux disease)    Heart murmur    hx of in past   Heart palpitations    Hyperlipidemia    Hypertension    Microscopic hematuria    Obesity    Past Surgical History:  Procedure Laterality Date   BREAST LUMPECTOMY WITH RADIOACTIVE SEED LOCALIZATION Left 06/04/2019   Procedure: LEFT BREAST LUMPECTOMY WITH RADIOACTIVE SEED LOCALIZATION;  Surgeon: Caralyn Chandler, MD;  Location: [facility];  Service: General;  Laterality: Left;   calcium  removed from left heel     COLONOSCOPY  2016   COLONOSCOPY  12/31/2022   HEEL SPUR SURGERY     KNEE ARTHROSCOPY Left 04/22/2013   Procedure: LEFT KNEE ARTHROSCOPY WITH medial meniscusectomy and chondroplasty;  Surgeon: Aurther Blue, MD;  Location: [facility];  Service: Orthopedics;  Laterality: Left;   POLYPECTOMY     TONSILLECTOMY     TOTAL KNEE ARTHROPLASTY Right 02/16/2019   Procedure: TOTAL KNEE ARTHROPLASTY;  Surgeon: Liliane Rei, MD;  Location: [facility];  Service: Orthopedics;  Laterality: Right;    TUBAL LIGATION     Current Outpatient Medications on File Prior to Visit  Medication Sig Dispense Refill   anastrozole  (ARIMIDEX ) 1 MG tablet TAKE 1 TABLET(1 MG) BY MOUTH DAILY 90 tablet 3   Ascorbic Acid (VITAMIN C) 1000 MG tablet Take 1,000 mg by mouth daily.     aspirin  EC 81 MG tablet Take 81 mg by mouth daily.     Cholecalciferol (VITAMIN D -3) 125 MCG (5000 UT) TABS Take 125 mcg by mouth daily.     Coenzyme Q10 (Q-10 CO-ENZYME PO) Take 150 mg by mouth daily.     diphenhydramine -acetaminophen  (TYLENOL  PM) 25-500 MG TABS tablet Take 1 tablet by mouth at bedtime as needed.     hydrochlorothiazide  (HYDRODIURIL ) 25 MG tablet TAKE 1 TABLET(25 MG) BY MOUTH DAILY 90 tablet 3   MAGNESIUM  GLYCINATE PO Take 400 mg by mouth daily.     No current facility-administered  medications on file prior to visit.   No Known Allergies Social History   Socioeconomic History   Marital status: Married    Spouse name: Royston Cornea   Number of children: 3   Years of education: Not on file   Highest education level: 12th grade  Occupational History   Occupation: retired   Tobacco Use   Smoking status: Former    Types: Cigarettes   Smokeless tobacco: Never   Tobacco comments:    just smoked in high school  Vaping Use   Vaping status: Never Used  Substance and Sexual Activity   Alcohol use: Not Currently   Drug use: No   Sexual activity:  Yes    Birth control/protection: Surgical  Other Topics Concern   Not on file  Social History Narrative   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 No other exercise.Married. Never smoked.   2 sons, 1 daughter, 8 grandchildren.   Social Drivers of Corporate investment banker Strain: Low Risk  (12/27/2023)   Overall Financial Resource Strain (CARDIA)    Difficulty of Paying Living Expenses: Not hard at all  Food Insecurity: No Food Insecurity (12/27/2023)   Hunger Vital Sign    Worried About Running Out of Food in the Last Year: Never true    Ran Out of Food in the Last Year: Never true  Transportation Needs: No Transportation Needs (12/27/2023)   PRAPARE - Administrator, Civil Service (Medical): No    Lack of Transportation (Non-Medical): No  Physical Activity: Sufficiently Active (12/27/2023)   Exercise Vital Sign    Days of Exercise per Week: 7 days    Minutes of Exercise per Session: 80 min  Stress: No Stress Concern Present (12/27/2023)   [employer] of Occupational Health - Occupational Stress Questionnaire    Feeling of Stress : Only a little  Social Connections: Socially Integrated (12/27/2023)   Social Connection and Isolation Panel [NHANES]    Frequency of Communication with Friends and Family: More than three times a week    Frequency of Social Gatherings with Friends and Family: More than three times a week    Attends Religious Services: More than 4 times per year    Active Member of [employer] or Organizations: Yes    Attends Engineer, structural: More than 4 times per year    Marital Status: Married  Catering manager Violence: Not At Risk (09/11/2022)   Humiliation, Afraid, Rape, and Kick questionnaire    Fear of Current or Ex-Partner: No    Emotionally Abused: No    Physically Abused: No    Sexually Abused: No   Family History  Problem Relation Age of Onset   Heart disease  Mother 39       MI at 32 and 37   Cancer Father 58       Prostate Cancer   Colon polyps Father    Stomach cancer Father    Hodgkin's lymphoma Brother    Esophageal cancer Neg Hx    Rectal cancer Neg Hx    Colon cancer Neg Hx      Review of Systems  All other systems reviewed and are negative.      Objective:   Physical Exam Vitals reviewed.  Constitutional:      General: She is not in acute distress.    Appearance: Normal appearance. She is normal weight. She is not ill-appearing, toxic-appearing or  diaphoretic.  Cardiovascular:     Rate and Rhythm: Normal rate and regular rhythm.     Pulses: Normal pulses.     Heart sounds: Normal heart sounds. No murmur heard.    No friction rub. No gallop.  Pulmonary:     Effort: Pulmonary effort is normal. No respiratory distress.     Breath sounds: Normal breath sounds. No stridor. No wheezing, rhonchi or rales.  Chest:     Chest wall: No tenderness.  Abdominal:     General: Bowel sounds are normal. There is no distension.     Palpations: Abdomen is soft.     Tenderness: There is no abdominal tenderness. There is no guarding or rebound.  Musculoskeletal:     Right lower leg: No edema.     Left lower leg: No edema.  Lymphadenopathy:     Cervical: No cervical adenopathy.  Neurological:     General: No focal deficit present.     Mental Status: She is alert and oriented to person, place, and time.     Cranial Nerves: No cranial nerve deficit.     Sensory: No sensory deficit.     Coordination: Coordination normal.   Patient does have swelling in the dorsal right midfoot.  There are some burning and pain that she reports in that area.  It is asymmetric when compared to the left midfoot.     Assessment & Plan:  Encounter for Medicare annual wellness exam Encounter for Medicare annual wellness exam  Pure hypercholesterolemia  Primary hypertension  Right foot pain - Plan: DG Foot Complete Right I believe the patient likely  has osteoarthritis in the right midfoot.  Obtain an x-ray to evaluate further.  Patient will schedule her mammogram in October.  Colonoscopy is due in 2029 bone density is due in 2028.  Recommended RSV vaccine.  The remainder of her immunizations are up-to-date.  Blood pressure is well-controlled.  Recommended a statin for cholesterol.  Patient politely declines.  The remainder of her preventative care is up-to-date.

## 2024-01-28 NOTE — Progress Notes (Signed)
 Subjective:   [name] is a 73 y.o. female who presents for Medicare Annual (Subsequent) preventive examination.  Visit Complete: In person  Patient Medicare AWV questionnaire was completed by the patient on 01/28/2024; I have confirmed that all information answered by patient is correct and no changes since this date.        Objective:     There were no vitals filed for this visit. There is no height or weight on file to calculate BMI.     09/11/2022    3:00 PM 06/30/2021   10:12 AM 03/02/2020    3:36 PM 06/26/2019    8:33 AM 06/04/2019    6:46 AM 02/16/2019    2:33 PM 02/10/2019    2:53 PM  Advanced Directives  Does Patient Have a Medical Advance Directive? No No No No No No No  Would patient like information on creating a medical advance directive? Yes (MAU/Ambulatory/Procedural Areas - Information given) No - Patient declined  No - Patient declined No - Patient declined Yes (ED - Information included in AVS) Yes (ED - Information included in AVS)    Current Medications (verified) Outpatient Encounter Medications as of 01/28/2024  Medication Sig   anastrozole  (ARIMIDEX ) 1 MG tablet TAKE 1 TABLET(1 MG) BY MOUTH DAILY   Ascorbic Acid (VITAMIN C) 1000 MG tablet Take 1,000 mg by mouth daily.   aspirin  EC 81 MG tablet Take 81 mg by mouth daily.   Cholecalciferol (VITAMIN D -3) 125 MCG (5000 UT) TABS Take 125 mcg by mouth daily.   Coenzyme Q10 (Q-10 CO-ENZYME PO) Take 150 mg by mouth daily.   diphenhydramine -acetaminophen  (TYLENOL  PM) 25-500 MG TABS tablet Take 1 tablet by mouth at bedtime as needed.   hydrochlorothiazide  (HYDRODIURIL ) 25 MG tablet TAKE 1 TABLET(25 MG) BY MOUTH DAILY   MAGNESIUM  GLYCINATE PO Take 400 mg by mouth daily.   No facility-administered encounter medications on file as of 01/28/2024.    Allergies (verified) Patient has no known allergies.   History: Past Medical History:  Diagnosis Date   Allergy    per pt   Anxiety    Arthritis    knees     Ductal carcinoma in situ of breast    left   GERD (gastroesophageal reflux disease)    Heart murmur    hx of in past   Heart palpitations    Hyperlipidemia    Hypertension    Microscopic hematuria    Obesity    Past Surgical History:  Procedure Laterality Date   BREAST LUMPECTOMY WITH RADIOACTIVE SEED LOCALIZATION Left 06/04/2019   Procedure: LEFT BREAST LUMPECTOMY WITH RADIOACTIVE SEED LOCALIZATION;  Surgeon: Caralyn Chandler, MD;  Location: [facility];  Service: General;  Laterality: Left;   calcium  removed from left heel     COLONOSCOPY  2016   COLONOSCOPY  12/31/2022   HEEL SPUR SURGERY     KNEE ARTHROSCOPY Left 04/22/2013   Procedure: LEFT KNEE ARTHROSCOPY WITH medial meniscusectomy and chondroplasty;  Surgeon: Aurther Blue, MD;  Location: [facility];  Service: Orthopedics;  Laterality: Left;   POLYPECTOMY     TONSILLECTOMY     TOTAL KNEE ARTHROPLASTY Right 02/16/2019   Procedure: TOTAL KNEE ARTHROPLASTY;  Surgeon: Liliane Rei, MD;  Location: [facility];  Service: Orthopedics;  Laterality: Right;    TUBAL LIGATION     Family History  Problem Relation Age of Onset   Heart disease Mother 36       MI at  62 and 86   Cancer Father 75       Prostate Cancer   Colon polyps Father    Stomach cancer Father    Hodgkin's lymphoma Brother    Esophageal cancer Neg Hx    Rectal cancer Neg Hx    Colon cancer Neg Hx    Social History   Socioeconomic History   Marital status: Married    Spouse name: Royston Cornea   Number of children: 3   Years of education: Not on file   Highest education level: 12th grade  Occupational History   Occupation: retired   Tobacco Use   Smoking status: Former    Types: Cigarettes   Smokeless tobacco: Never   Tobacco comments:    just smoked in high school  Vaping Use   Vaping status: Never Used  Substance and Sexual Activity   Alcohol use: Not Currently   Drug use: No   Sexual activity: Yes    Birth control/protection:  Surgical  Other Topics Concern   Not on file  Social History Narrative   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 No other exercise.Married. Never smoked.   2 sons, 1 daughter, 8 grandchildren.   Social Drivers of Corporate investment banker Strain: Low Risk  (12/27/2023)   Overall Financial Resource Strain (CARDIA)    Difficulty of Paying Living Expenses: Not hard at all  Food Insecurity: No Food Insecurity (12/27/2023)   Hunger Vital Sign    Worried About Running Out of Food in the Last Year: Never true    Ran Out of Food in the Last Year: Never true  Transportation Needs: No Transportation Needs (12/27/2023)   PRAPARE - Administrator, Civil Service (Medical): No    Lack of Transportation (Non-Medical): No  Physical Activity: Sufficiently Active (12/27/2023)   Exercise Vital Sign    Days of Exercise per Week: 7 days    Minutes of Exercise per Session: 80 min  Stress: No Stress Concern Present (12/27/2023)   [employer] of Occupational Health - Occupational Stress Questionnaire    Feeling of Stress : Only a little  Social Connections: Socially Integrated (12/27/2023)   Social Connection and Isolation Panel [NHANES]    Frequency of Communication with Friends and Family: More than three times a week    Frequency of Social Gatherings with Friends and Family: More than three times a week    Attends Religious Services: More than 4 times per year    Active Member of [employer] or Organizations: Yes    Attends Engineer, structural: More than 4 times per year    Marital Status: Married    Tobacco Counseling Counseling given: Not Answered Tobacco comments: just smoked in high school   Clinical Intake:                        Activities of Daily Living     No data to display          Patient Care Team: Austine Lefort, MD as PCP - General (Family Medicine) Debbe Fail, PA-C as Physician Assistant (Physician Assistant) Alane Hsu, RN as Oncology Nurse Navigator Auther Bo, RN as Oncology Nurse Navigator Caralyn Chandler, MD as Consulting Physician (General Surgery) Sonja [place], MD as Consulting Physician (Hematology) Colie Dawes, MD as Attending Physician (Radiation Oncology) Burton, Lacie K, NP as Nurse Practitioner (  Nurse Practitioner)  Indicate any recent Medical Services you may have received from other than [facility] providers in the past year (date may be approximate).     Assessment:    This is a routine wellness examination for [place].  Hearing/Vision screen No results found.   Goals Addressed   None    Depression Screen    01/29/2023   10:57 AM 09/11/2022    2:59 PM 07/30/2022   11:03 AM 06/30/2021   10:09 AM 05/02/2020    1:19 PM 04/30/2018   10:37 AM 10/23/2017    3:11 PM  PHQ 2/9 Scores  PHQ - 2 Score 0 0 0 0 0 0 0  PHQ- 9 Score 2          Fall Risk    01/29/2023   10:57 AM 09/11/2022    2:58 PM 07/30/2022   11:03 AM 06/30/2021   10:13 AM 05/02/2020    1:19 PM  Fall Risk   Falls in the past year? 0 0 0 0 0  Number falls in past yr: 0 0 0 0 0  Injury with Fall? 0 0 0 0 0  Risk for fall due to : No Fall Risks No Fall Risks No Fall Risks No Fall Risks   Follow up Falls evaluation completed Falls evaluation completed;Education provided;Falls prevention discussed Falls prevention discussed Falls prevention discussed     MEDICARE RISK AT HOME:    TIMED UP AND GO:  Was the test performed?  Yes  Length of time to ambulate 10 feet: 8 sec Gait steady and fast without use of assistive device    Cognitive Function:        09/11/2022    3:00 PM 06/30/2021   10:18 AM  6CIT Screen  What Year? 0 points 0 points  What month? 0 points 0 points  What time? 0 points 0 points  Count back from 20 0 points 0 points  Months in reverse 0 points 0 points  Repeat phrase 0 points 0 points  Total Score 0 points 0 points     Immunizations Immunization History  Administered Date(s) Administered   Fluad Quad(high Dose 65+) 08/04/2019, 06/29/2020   Influenza Split 06/26/2013   Influenza Whole 06/19/2007   Influenza, High Dose Seasonal PF 06/24/2018   Influenza,inj,Quad PF,6+ Mos 07/15/2014, 07/21/2015, 07/17/2016, 07/03/2017   Influenza-Unspecified 08/02/2021, 07/18/2022   PFIZER(Purple Top)SARS-COV-2 Vaccination 11/15/2019, 12/07/2019, 07/14/2020   Pfizer Covid-19 Vaccine Bivalent Booster 29yrs & up 04/24/2021, 07/12/2021   Pneumococcal Conjugate-13 07/07/2018   Pneumococcal Polysaccharide-23 08/04/2019   Td 09/18/2003, 04/25/2011   Td (Adult), 2 Lf Tetanus Toxid, Preservative Free 09/18/2003, 04/25/2011   Tdap 03/14/2022   Zoster Recombinant(Shingrix) 11/13/2021, 03/14/2022   Zoster, Live 09/22/2013    TDAP status: Up to date  Flu Vaccine status: Up to date  Pneumococcal vaccine status: Up to date  Covid-19 vaccine status: Declined, Education has been provided regarding the importance of this vaccine but patient still declined. Advised may receive this vaccine at local pharmacy or Health Dept.or vaccine clinic. Aware to provide a copy of the vaccination record if obtained from local pharmacy or Health Dept. Verbalized acceptance and understanding.  Qualifies for Shingles Vaccine? Yes   Zostavax completed No   Shingrix Completed?: Yes  Screening Tests Health Maintenance  Topic Date Due   COVID-19 Vaccine (6 - 2024-25 season) 05/19/2023   Medicare Annual Wellness (AWV)  09/12/2023   INFLUENZA VACCINE  04/17/2024   MAMMOGRAM  06/23/2024   Colonoscopy  12/31/2027  DTaP/Tdap/Td (6 - Td or Tdap) 03/14/2032   Pneumonia Vaccine 1+ Years old  Completed   DEXA SCAN  Completed   Hepatitis C Screening  Completed   Zoster Vaccines- Shingrix  Completed   HPV VACCINES  Aged Out   Meningococcal B Vaccine  Aged Out    Health Maintenance  Health Maintenance Due  Topic Date Due   COVID-19 Vaccine  (6 - 2024-25 season) 05/19/2023   Medicare Annual Wellness (AWV)  09/12/2023    Colorectal cancer screening: Type of screening: Colonoscopy. Completed 12/31/2022. Repeat every 5 years  Mammogram status: Completed 06/24/2023. Repeat every year  Bone Density status: Completed 06/21/2022. Results reflect: Bone density results: NORMAL. Repeat every 2 years.  Lung Cancer Screening: (Low Dose CT Chest recommended if Age 35-80 years, 20 pack-year currently smoking OR have quit w/in 15years.) does not qualify.   Lung Cancer Screening Referral: N/A  Additional Screening:  Hepatitis C Screening: does qualify; Completed 09/26/2017  Vision Screening: Recommended annual ophthalmology exams for early detection of glaucoma and other disorders of the eye. Is the patient up to date with their annual eye exam?  Yes  Who is the provider or what is the name of the office in which the patient attends annual eye exams? [facility] If pt is not established with a provider, would they like to be referred to a provider to establish care? No .   Dental Screening: Recommended annual dental exams for proper oral hygiene  Diabetic Foot Exam:   Community Resource Referral / Chronic Care Management: CRR required this visit?  No   CCM required this visit?  No     Plan:     I have personally reviewed and noted the following in the patient's chart:   Medical and social history Use of alcohol, tobacco or illicit drugs  Current medications and supplements including opioid prescriptions. Patient is not currently taking opioid prescriptions. Functional ability and status Nutritional status Physical activity Advanced directives List of other physicians Hospitalizations, surgeries, and ER visits in previous 12 months Vitals Screenings to include cognitive, depression, and falls Referrals and appointments  In addition, I have reviewed and discussed with patient certain preventive protocols, quality  metrics, and best practice recommendations. A written personalized care plan for preventive services as well as general preventive health recommendations were provided to patient.     Verneda Golder, LPN   1/61/0960   After Visit Summary: (In Person-Printed) AVS printed and given to the patient  Nurse Notes: Discussed RSV vaccine.   I have collaborated with the care management provider regarding care management and care coordination activities outlined in this encounter and have reviewed this encounter including documentation in the note and care plan. I am certifying that I agree with the content of this note and encounter as supervising physician.

## 2024-02-03 ENCOUNTER — Ambulatory Visit
Admission: RE | Admit: 2024-02-03 | Discharge: 2024-02-03 | Disposition: A | Discharge status: Home / Self Care | Source: Ambulatory Visit | Attending: Family Medicine | Admitting: Family Medicine

## 2024-02-03 DIAGNOSIS — M19071 Primary osteoarthritis, right ankle and foot: Secondary | ICD-10-CM | POA: Diagnosis not present

## 2024-02-03 DIAGNOSIS — M7731 Calcaneal spur, right foot: Secondary | ICD-10-CM | POA: Diagnosis not present

## 2024-02-03 DIAGNOSIS — M79671 Pain in right foot: Secondary | ICD-10-CM | POA: Diagnosis not present

## 2024-02-04 ENCOUNTER — Ambulatory Visit: Payer: Self-pay | Admitting: Family Medicine

## 2024-03-03 ENCOUNTER — Other Ambulatory Visit: Payer: Self-pay

## 2024-03-03 MED ORDER — MELOXICAM 15 MG PO TABS: 15.0000 mg | ORAL_TABLET | ORAL | 0 refills | Status: AC | PRN

## 2024-03-05 ENCOUNTER — Other Ambulatory Visit: Payer: Self-pay | Admitting: Hematology

## 2024-03-05 DIAGNOSIS — D0512 Intraductal carcinoma in situ of left breast: Secondary | ICD-10-CM

## 2024-03-05 NOTE — Telephone Encounter (Signed)
 Per OV note, continue until 08/2024. Terrel Ferries, RN

## 2024-03-13 ENCOUNTER — Other Ambulatory Visit: Payer: Self-pay

## 2024-03-13 DIAGNOSIS — D0512 Intraductal carcinoma in situ of left breast: Secondary | ICD-10-CM

## 2024-03-15 NOTE — Assessment & Plan Note (Addendum)
-  diagnosed in 05/2019, s/p left lumpectomy and adjuvant radiation.  -She started antiestrogen therapy with Anastrozole  in 08/2019. She has some hot flashes, no further joint pain. -most recent mammogram on 05/30/21 at [facility] was benign. -She is clinically doing well. Lab reviewed, CBC and CMP are within normal limits. Her physical exam was unremarkable. There is no clinical concern for recurrence. -Continue surveillance. Next mammogram in 06/2024. We discussed the option of screening MRI. She is interested if her insurance will cover it. -f/u with me in one year

## 2024-03-16 ENCOUNTER — Inpatient Hospital Stay: Attending: Hematology

## 2024-03-16 ENCOUNTER — Inpatient Hospital Stay (HOSPITAL_BASED_OUTPATIENT_CLINIC_OR_DEPARTMENT_OTHER): Admitting: Hematology

## 2024-03-16 VITALS — BP 151/78 | HR 55 | Temp 98.7°F | Resp 17 | Ht 69.0 in | Wt 175.2 lb

## 2024-03-16 DIAGNOSIS — D0512 Intraductal carcinoma in situ of left breast: Secondary | ICD-10-CM

## 2024-03-16 DIAGNOSIS — Z79811 Long term (current) use of aromatase inhibitors: Secondary | ICD-10-CM | POA: Diagnosis not present

## 2024-03-16 DIAGNOSIS — I10 Essential (primary) hypertension: Secondary | ICD-10-CM | POA: Diagnosis not present

## 2024-03-16 DIAGNOSIS — E2839 Other primary ovarian failure: Secondary | ICD-10-CM

## 2024-03-16 DIAGNOSIS — Z923 Personal history of irradiation: Secondary | ICD-10-CM | POA: Diagnosis not present

## 2024-03-16 LAB — CBC WITH DIFFERENTIAL (CANCER CENTER ONLY)
Abs Immature Granulocytes: 0.01 10*3/uL (ref 0.00–0.07)
Basophils Absolute: 0 10*3/uL (ref 0.0–0.1)
Basophils Relative: 1 %
Eosinophils Absolute: 0.3 10*3/uL (ref 0.0–0.5)
Eosinophils Relative: 5 %
HCT: 42.8 % (ref 36.0–46.0)
Hemoglobin: 14 g/dL (ref 12.0–15.0)
Immature Granulocytes: 0 %
Lymphocytes Relative: 25 %
Lymphs Abs: 1.3 10*3/uL (ref 0.7–4.0)
MCH: 28.4 pg (ref 26.0–34.0)
MCHC: 32.7 g/dL (ref 30.0–36.0)
MCV: 86.8 fL (ref 80.0–100.0)
Monocytes Absolute: 0.6 10*3/uL (ref 0.1–1.0)
Monocytes Relative: 11 %
Neutro Abs: 3 10*3/uL (ref 1.7–7.7)
Neutrophils Relative %: 58 %
Platelet Count: 209 10*3/uL (ref 150–400)
RBC: 4.93 MIL/uL (ref 3.87–5.11)
RDW: 13.7 % (ref 11.5–15.5)
WBC Count: 5.2 10*3/uL (ref 4.0–10.5)
nRBC: 0 % (ref 0.0–0.2)

## 2024-03-16 LAB — CMP (CANCER CENTER ONLY)
ALT: 9 U/L (ref 0–44)
AST: 15 U/L (ref 15–41)
Albumin: 4.3 g/dL (ref 3.5–5.0)
Alkaline Phosphatase: 49 U/L (ref 38–126)
Anion gap: 6 (ref 5–15)
BUN: 14 mg/dL (ref 8–23)
CO2: 31 mmol/L (ref 22–32)
Calcium: 9.7 mg/dL (ref 8.9–10.3)
Chloride: 105 mmol/L (ref 98–111)
Creatinine: 0.74 mg/dL (ref 0.44–1.00)
GFR, Estimated: >60 mL/min (ref >60)
Glucose, Bld: 97 mg/dL (ref 70–99)
Potassium: 4.4 mmol/L (ref 3.5–5.1)
Sodium: 142 mmol/L (ref 135–145)
Total Bilirubin: 0.7 mg/dL (ref 0.0–1.2)
Total Protein: 6.9 g/dL (ref 6.5–8.1)

## 2024-03-16 NOTE — Progress Notes (Signed)
 [facility]   [phone]) [phone] [phone]   Clinic Follow up Note   Patient Care Team: Duanne Butler DASEN, MD as PCP - General (Family Medicine) Melvenia Ronal KATHEE DEVONNA as Physician Assistant (Physician Assistant) Tyree Nanetta SAILOR, RN as Oncology Nurse Navigator Glean, Stephane BROCKS, RN (Inactive) as Oncology Nurse Navigator Curvin Deward MOULD, MD as Consulting Physician (General Surgery) Lanny Callander, MD as Consulting Physician (Hematology) Izell Domino, MD as Attending Physician (Radiation Oncology) Burton, Lacie K, NP as Nurse Practitioner (Nurse Practitioner)  Date of Service:  03/16/2024  CHIEF COMPLAINT: f/u of left breast DCIS  CURRENT THERAPY:  Anastrozole  1 mg daily  Oncology History   Ductal carcinoma in situ (DCIS) of left breast -diagnosed in 05/2019, s/p left lumpectomy and adjuvant radiation.  -She started antiestrogen therapy with Anastrozole  in 08/2019. She has some hot flashes, no further joint pain.  Plan for total 5 years. -most recent mammogram on 05/30/21 at [facility] was benign. -She is clinically doing well. Lab reviewed, CBC and CMP are within normal limits. Her physical exam was unremarkable. There is no clinical concern for recurrence. -Continue surveillance. Next mammogram in 06/2024.   Assessment & Plan Breast cancer, stage 0 Stage 0 breast cancer, status post-surgery with minimal scar tissue and inverted left nipple. Currently on anastrozole  since December 2020, completing five years of treatment by the end of this year. No new symptoms or pain reported. Blood counts are normal, and kidney and liver function tests are pending. Last mammogram was in October last year, and she is due for another in three months. Bone density scan was normal in 2023, and she is due for another this year. - Continue anastrozole  until the end of the year. - Order bone density scan to coincide with the mammogram. - Advise regular mammograms and breast exams. -  Discuss optional follow-up post-anastrozole , emphasizing regular mammograms and annual visits.  Hypertension Hypertension with non-adherence to medication. She reports not taking her medication recently and not regularly checking her blood pressure at home. No symptoms of chest pain or headaches reported. - Advise regular home blood pressure monitoring. - Ensure medication adherence and refill prescriptions as needed.  Plan - She is clinically doing very well, lab reviewed, exam unremarkable - She will continue anastrozole  until the end of this year - I will see her as needed, she will follow-up with her PCP for breast cancer surveillance.   SUMMARY OF ONCOLOGIC HISTORY: Oncology History Overview Note  Cancer Staging Ductal carcinoma in situ (DCIS) of left breast Staging form: Breast, AJCC 8th Edition - Clinical: Stage 0 (cTis (DCIS), cN0, cM0, ER+, PR+, HER2: Not Assessed) - Signed by Lanny Callander, MD on 05/06/2019 - Pathologic: No stage assigned - Unsigned    Ductal carcinoma in situ (DCIS) of left breast  04/27/2019 Initial Biopsy   Diagnosis 04/27/19 Breast, left, needle core biopsy, lower outer calcification, 10cmfn - DUCTAL CARCINOMA IN SITU WITH CALCIFICATIONS. - SEE COMMENT.  Results: IMMUNOHISTOCHEMICAL AND MORPHOMETRIC ANALYSIS PERFORMED MANUALLY Estrogen Receptor: 100%, POSITIVE, STRONG STAINING INTENSITY Progesterone Receptor: 80%, POSITIVE, STRONG STAINING INTENSITY   04/30/2019 Initial Diagnosis   Ductal carcinoma in situ (DCIS) of left breast   05/06/2019 Cancer Staging   Staging form: Breast, AJCC 8th Edition - Clinical: Stage 0 (cTis (DCIS), cN0, cM0, ER+, PR+, HER2: Not Assessed) - Signed by Lanny Callander, MD on 05/06/2019   06/04/2019 Surgery   LEFT BREAST LUMPECTOMY WITH RADIOACTIVE SEED LOCALIZATION by Dr Curvin  06/04/19    06/04/2019  Pathology Results   DIAGNOSIS: 06/04/19  A. BREAST, LEFT, LUMPECTOMY:  -  Ductal carcinoma in situ, intermediate grade, 0.3 cm  -   Margins uninvolved by carcinoma (0.2 cm; medial margin)  -  Previous biopsy site changes  -  See oncology table below   B. BREAST, LEFT SUPERIOR MARGIN, EXCISION:  -  Focal residual ductal carcinoma in situ  -  Margins uninvolved by carcinoma (0.3 cm)   C. BREAST, LEFT MEDIAL MARGIN, EXCISION:  -  No residual carcinoma identified   D. BREAST, LEFT DEEP MARGIN, EXCISION:  -  No residual carcinoma identified     07/06/2019 - 07/31/2019 Radiation Therapy   Adjuvant Radiation with Dr Izell 07/06/19-07/31/19   08/2019 -  Anti-estrogen oral therapy   Anastrozole  once daily starting in 08/2019   11/02/2019 Survivorship   SCP delivered by Lacie Burton, NP       Discussed the use of AI scribe software for clinical note transcription with the patient, who gave verbal consent to proceed.  History of Present Illness [name] is a 73 year old female with breast cancer who presents for follow-up.  She is on anastrozole , completing her five-year treatment at the end of this year. She experiences occasional hot flashes but denies joint pain, stiffness, or new symptoms. Her last mammogram was in October of the previous year, and she has scheduled her next one. She is due for a bone density scan this year but has not yet scheduled it. She performs regular breast exams and reports no issues.     All other systems were reviewed with the patient and are negative.  MEDICAL HISTORY:  Past Medical History:  Diagnosis Date   Allergy    per pt   Anxiety    Arthritis    knees    Ductal carcinoma in situ of breast    left   GERD (gastroesophageal reflux disease)    Heart murmur    hx of in past   Heart palpitations    Hyperlipidemia    Hypertension    Microscopic hematuria    Obesity     SURGICAL HISTORY: Past Surgical History:  Procedure Laterality Date   BREAST LUMPECTOMY WITH RADIOACTIVE SEED LOCALIZATION Left 06/04/2019   Procedure: LEFT BREAST LUMPECTOMY WITH RADIOACTIVE  SEED LOCALIZATION;  Surgeon: Curvin Deward MOULD, MD;  Location: [facility];  Service: General;  Laterality: Left;   calcium  removed from left heel     COLONOSCOPY  2016   COLONOSCOPY  12/31/2022   HEEL SPUR SURGERY     KNEE ARTHROSCOPY Left 04/22/2013   Procedure: LEFT KNEE ARTHROSCOPY WITH medial meniscusectomy and chondroplasty;  Surgeon: Dempsey LULLA Moan, MD;  Location: [facility];  Service: Orthopedics;  Laterality: Left;   POLYPECTOMY     TONSILLECTOMY     TOTAL KNEE ARTHROPLASTY Right 02/16/2019   Procedure: TOTAL KNEE ARTHROPLASTY;  Surgeon: Moan Dempsey, MD;  Location: [facility];  Service: Orthopedics;  Laterality: Right;    TUBAL LIGATION      I have reviewed the social history and family history with the patient and they are unchanged from previous note.  ALLERGIES:  has no known allergies.  MEDICATIONS:  Current Outpatient Medications  Medication Sig Dispense Refill   meloxicam  (MOBIC ) 15 MG tablet Take 1 tablet (15 mg total) by mouth as needed for pain (foot pain). 30 tablet 0   anastrozole  (ARIMIDEX ) 1 MG tablet TAKE 1 TABLET EVERY DAY 90 tablet 3  Ascorbic Acid (VITAMIN C) 1000 MG tablet Take 1,000 mg by mouth daily.     aspirin  EC 81 MG tablet Take 81 mg by mouth daily.     Cholecalciferol (VITAMIN D -3) 125 MCG (5000 UT) TABS Take 125 mcg by mouth daily.     Coenzyme Q10 (Q-10 CO-ENZYME PO) Take 150 mg by mouth daily.     diphenhydramine -acetaminophen  (TYLENOL  PM) 25-500 MG TABS tablet Take 1 tablet by mouth at bedtime as needed.     hydrochlorothiazide  (HYDRODIURIL ) 25 MG tablet TAKE 1 TABLET(25 MG) BY MOUTH DAILY 90 tablet 3   MAGNESIUM  GLYCINATE PO Take 400 mg by mouth daily.     No current facility-administered medications for this visit.    PHYSICAL EXAMINATION: ECOG PERFORMANCE STATUS: 0 - Asymptomatic  Vitals:   03/16/24 0942 03/16/24 0943  BP: (!) 150/81 (!) 151/78  Pulse: (!) 55   Resp: 17   Temp: 98.7 F (37.1 C)   SpO2: 100%    Wt  Readings from Last 3 Encounters:  03/16/24 175 lb 4 oz (79.5 kg)  01/28/24 173 lb (78.5 kg)  01/02/24 178 lb (80.7 kg)     GENERAL:alert, no distress and comfortable SKIN: skin color, texture, turgor are normal, no rashes or significant lesions EYES: normal, Conjunctiva are pink and non-injected, sclera clear NECK: supple, thyroid normal size, non-tender, without nodularity LYMPH:  no palpable lymphadenopathy in the cervical, axillary  LUNGS: clear to auscultation and percussion with normal breathing effort HEART: regular rate & rhythm and no murmurs and no lower extremity edema ABDOMEN:abdomen soft, non-tender and normal bowel sounds Musculoskeletal:no cyanosis of digits and no clubbing  NEURO: alert & oriented x 3 with fluent speech, no focal motor/sensory deficits BREAST: Left breast incision healed well with minimal scar tissue. Left nipple inverted. Physical Exam   LABORATORY DATA:  I have reviewed the data as listed    Latest Ref Rng & Units 03/16/2024    9:22 AM 01/02/2024    8:21 AM 02/18/2023    1:53 PM  CBC  WBC 4.0 - 10.5 K/uL 5.2  4.5  6.3   Hemoglobin 12.0 - 15.0 g/dL 85.9  86.3  85.7   Hematocrit 36.0 - 46.0 % 42.8  42.7  42.5   Platelets 150 - 400 K/uL 209  207  221         Latest Ref Rng & Units 03/16/2024    9:22 AM 01/02/2024    8:21 AM 02/18/2023    1:53 PM  CMP  Glucose 70 - 99 mg/dL 97  92  886   BUN 8 - 23 mg/dL 14  15  15    Creatinine 0.44 - 1.00 mg/dL 9.25  9.32  9.27   Sodium 135 - 145 mmol/L 142  144  142   Potassium 3.5 - 5.1 mmol/L 4.4  3.8  3.5   Chloride 98 - 111 mmol/L 105  108  104   CO2 22 - 32 mmol/L 31  27  29    Calcium  8.9 - 10.3 mg/dL 9.7  9.2  9.8   Total Protein 6.5 - 8.1 g/dL 6.9  6.0  6.9   Total Bilirubin 0.0 - 1.2 mg/dL 0.7  0.4  0.5   Alkaline Phos 38 - 126 U/L 49   49   AST 15 - 41 U/L 15  14  18    ALT 0 - 44 U/L 9  10  11        RADIOGRAPHIC STUDIES: I have personally reviewed the  radiological images as listed and agreed  with the findings in the report. No results found.    Orders Placed This Encounter  Procedures   DG Bone Density    Standing Status:   Future    Expected Date:   07/06/2024    Expiration Date:   03/16/2025    Scheduling Instructions:     [facility]    Reason for Exam (SYMPTOM  OR DIAGNOSIS REQUIRED):   screening    Preferred imaging location?:   External   All questions were answered. The patient knows to call the clinic with any problems, questions or concerns. No barriers to learning was detected. The total time spent in the appointment was 25 minutes, including review of chart and various tests results, discussions about plan of care and coordination of care plan     Onita Mattock, MD 03/16/2024

## 2024-06-12 DIAGNOSIS — Z23 Encounter for immunization: Secondary | ICD-10-CM | POA: Diagnosis not present

## 2024-06-29 DIAGNOSIS — E2839 Other primary ovarian failure: Secondary | ICD-10-CM | POA: Diagnosis not present

## 2024-06-29 DIAGNOSIS — Z1231 Encounter for screening mammogram for malignant neoplasm of breast: Secondary | ICD-10-CM | POA: Diagnosis not present

## 2024-06-29 DIAGNOSIS — Z23 Encounter for immunization: Secondary | ICD-10-CM | POA: Diagnosis not present

## 2024-06-29 LAB — HM MAMMOGRAPHY

## 2024-07-01 ENCOUNTER — Encounter: Payer: Self-pay | Admitting: Family Medicine

## 2024-07-02 ENCOUNTER — Encounter: Payer: Self-pay | Admitting: Hematology

## 2024-07-03 ENCOUNTER — Ambulatory Visit: Admitting: Family Medicine

## 2024-07-27 DIAGNOSIS — H33311 Horseshoe tear of retina without detachment, right eye: Secondary | ICD-10-CM | POA: Diagnosis not present

## 2025-01-04 ENCOUNTER — Encounter: Admitting: Family Medicine
